# Patient Record
Sex: Male | Born: 1970 | Race: White | Hispanic: No | Marital: Married | Smoking: Never smoker
Health system: Southern US, Community
[De-identification: ages and names within clinical notes are randomized; demographics above are authoritative.]

## PROBLEM LIST (undated history)

## (undated) DIAGNOSIS — N486 Induration penis plastica: Secondary | ICD-10-CM

## (undated) DIAGNOSIS — K529 Noninfective gastroenteritis and colitis, unspecified: Secondary | ICD-10-CM

## (undated) DIAGNOSIS — D509 Iron deficiency anemia, unspecified: Secondary | ICD-10-CM

## (undated) DIAGNOSIS — G8929 Other chronic pain: Secondary | ICD-10-CM

## (undated) DIAGNOSIS — A630 Anogenital (venereal) warts: Secondary | ICD-10-CM

## (undated) DIAGNOSIS — R51 Headache: Secondary | ICD-10-CM

## (undated) DIAGNOSIS — K259 Gastric ulcer, unspecified as acute or chronic, without hemorrhage or perforation: Secondary | ICD-10-CM

## (undated) DIAGNOSIS — R519 Headache, unspecified: Secondary | ICD-10-CM

## (undated) DIAGNOSIS — N411 Chronic prostatitis: Secondary | ICD-10-CM

## (undated) DIAGNOSIS — N529 Male erectile dysfunction, unspecified: Secondary | ICD-10-CM

## (undated) DIAGNOSIS — F909 Attention-deficit hyperactivity disorder, unspecified type: Secondary | ICD-10-CM

## (undated) DIAGNOSIS — K5090 Crohn's disease, unspecified, without complications: Secondary | ICD-10-CM

## (undated) HISTORY — PX: WISDOM TOOTH EXTRACTION: SHX21

## (undated) HISTORY — DX: Headache: R51

## (undated) HISTORY — DX: Other chronic pain: G89.29

## (undated) HISTORY — DX: Induration penis plastica: N48.6

## (undated) HISTORY — DX: Noninfective gastroenteritis and colitis, unspecified: K52.9

## (undated) HISTORY — DX: Gastric ulcer, unspecified as acute or chronic, without hemorrhage or perforation: K25.9

## (undated) HISTORY — DX: Crohn's disease, unspecified, without complications: K50.90

## (undated) HISTORY — DX: Chronic prostatitis: N41.1

## (undated) HISTORY — DX: Headache, unspecified: R51.9

## (undated) HISTORY — DX: Iron deficiency anemia, unspecified: D50.9

## (undated) HISTORY — DX: Anogenital (venereal) warts: A63.0

## (undated) HISTORY — DX: Attention-deficit hyperactivity disorder, unspecified type: F90.9

## (undated) HISTORY — DX: Male erectile dysfunction, unspecified: N52.9

---

## 1998-10-03 HISTORY — PX: COLON SURGERY: SHX602

## 1999-03-08 HISTORY — PX: ESOPHAGOGASTRODUODENOSCOPY: SHX1529

## 2014-03-13 DIAGNOSIS — E162 Hypoglycemia, unspecified: Secondary | ICD-10-CM | POA: Insufficient documentation

## 2014-03-13 DIAGNOSIS — K5090 Crohn's disease, unspecified, without complications: Secondary | ICD-10-CM | POA: Insufficient documentation

## 2014-03-13 DIAGNOSIS — R519 Headache, unspecified: Secondary | ICD-10-CM | POA: Insufficient documentation

## 2014-03-13 DIAGNOSIS — R053 Chronic cough: Secondary | ICD-10-CM | POA: Insufficient documentation

## 2014-03-13 DIAGNOSIS — K259 Gastric ulcer, unspecified as acute or chronic, without hemorrhage or perforation: Secondary | ICD-10-CM | POA: Insufficient documentation

## 2014-03-13 DIAGNOSIS — M5137 Other intervertebral disc degeneration, lumbosacral region: Secondary | ICD-10-CM | POA: Insufficient documentation

## 2014-03-13 DIAGNOSIS — R05 Cough: Secondary | ICD-10-CM | POA: Insufficient documentation

## 2014-03-13 DIAGNOSIS — D509 Iron deficiency anemia, unspecified: Secondary | ICD-10-CM | POA: Insufficient documentation

## 2014-03-13 DIAGNOSIS — R51 Headache: Secondary | ICD-10-CM

## 2014-11-12 DIAGNOSIS — G894 Chronic pain syndrome: Secondary | ICD-10-CM | POA: Insufficient documentation

## 2014-11-12 DIAGNOSIS — F909 Attention-deficit hyperactivity disorder, unspecified type: Secondary | ICD-10-CM | POA: Insufficient documentation

## 2015-07-21 ENCOUNTER — Ambulatory Visit: Admit: 2015-07-21 | Payer: Self-pay | Admitting: Unknown Physician Specialty

## 2015-07-21 HISTORY — PX: COLONOSCOPY: SHX174

## 2015-07-21 SURGERY — COLONOSCOPY WITH PROPOFOL
Anesthesia: General

## 2015-11-26 DIAGNOSIS — N486 Induration penis plastica: Secondary | ICD-10-CM | POA: Insufficient documentation

## 2015-11-26 DIAGNOSIS — A630 Anogenital (venereal) warts: Secondary | ICD-10-CM | POA: Insufficient documentation

## 2015-11-26 DIAGNOSIS — N411 Chronic prostatitis: Secondary | ICD-10-CM | POA: Insufficient documentation

## 2017-03-08 ENCOUNTER — Ambulatory Visit: Payer: Managed Care, Other (non HMO) | Attending: Nurse Practitioner | Admitting: Nurse Practitioner

## 2017-03-08 ENCOUNTER — Encounter: Payer: Self-pay | Admitting: Nurse Practitioner

## 2017-03-08 VITALS — BP 114/70 | HR 78 | Temp 97.6°F | Resp 16 | Ht 71.0 in | Wt 192.0 lb

## 2017-03-08 DIAGNOSIS — G894 Chronic pain syndrome: Secondary | ICD-10-CM

## 2017-03-08 DIAGNOSIS — M5137 Other intervertebral disc degeneration, lumbosacral region: Secondary | ICD-10-CM

## 2017-03-08 DIAGNOSIS — M545 Low back pain: Secondary | ICD-10-CM | POA: Insufficient documentation

## 2017-03-08 DIAGNOSIS — M79642 Pain in left hand: Secondary | ICD-10-CM

## 2017-03-08 DIAGNOSIS — F909 Attention-deficit hyperactivity disorder, unspecified type: Secondary | ICD-10-CM | POA: Diagnosis not present

## 2017-03-08 DIAGNOSIS — M542 Cervicalgia: Secondary | ICD-10-CM | POA: Diagnosis present

## 2017-03-08 DIAGNOSIS — K589 Irritable bowel syndrome without diarrhea: Secondary | ICD-10-CM | POA: Diagnosis not present

## 2017-03-08 DIAGNOSIS — K219 Gastro-esophageal reflux disease without esophagitis: Secondary | ICD-10-CM | POA: Insufficient documentation

## 2017-03-08 DIAGNOSIS — M79643 Pain in unspecified hand: Secondary | ICD-10-CM | POA: Insufficient documentation

## 2017-03-08 DIAGNOSIS — M51379 Other intervertebral disc degeneration, lumbosacral region without mention of lumbar back pain or lower extremity pain: Secondary | ICD-10-CM

## 2017-03-08 DIAGNOSIS — R51 Headache: Secondary | ICD-10-CM | POA: Diagnosis not present

## 2017-03-08 DIAGNOSIS — F17210 Nicotine dependence, cigarettes, uncomplicated: Secondary | ICD-10-CM | POA: Insufficient documentation

## 2017-03-08 DIAGNOSIS — M79641 Pain in right hand: Secondary | ICD-10-CM | POA: Diagnosis not present

## 2017-03-08 DIAGNOSIS — Z79891 Long term (current) use of opiate analgesic: Secondary | ICD-10-CM

## 2017-03-08 DIAGNOSIS — F329 Major depressive disorder, single episode, unspecified: Secondary | ICD-10-CM | POA: Insufficient documentation

## 2017-03-08 DIAGNOSIS — Z7982 Long term (current) use of aspirin: Secondary | ICD-10-CM | POA: Insufficient documentation

## 2017-03-08 NOTE — Patient Instructions (Signed)
____________________________________________________________________________________________  Appointment Policy  It is our goal and responsibility to provide the medical community with assistance in the evaluation and management of patients with chronic pain. Unfortunately our resources are limited. Because we do not have an unlimited amount of time, or available appointments, we are required to closely monitor and manage their use. The following rules exist to maximize their use:  Patient's responsibilities: 1. Punctuality: You are required to be physically present and registered in our facility at least 30 minutes before your appointment. 2. Tardiness: The cutoff is your appointment time. If you have an appointment scheduled for 10:00 AM and you arrive at 10:01, you will be required to reschedule your appointment.  3. Plan ahead: Always assume that you will encounter traffic on your way in. Plan for it. If you are dependent on a driver, make sure they understand these rules and the need to arrive early. 4. Other appointments and responsibilities: Avoid scheduling any other appointments before or after your pain clinic appointments.  5. Be prepared: Write down everything that you need to discuss with your healthcare provider and give this information to the admitting nurse. Write down the medications that you will need refilled. Bring your pills and bottles (even the empty ones), to all of your appointments, except for those where a procedure is scheduled. 6. No children or pets: Find someone to take care of them. It is not appropriate to bring them in. 7. Scheduling changes: We request "advanced notification" of any changes or cancellations. 8. Advanced notification: Defined as a time period of more than 24 hours prior to the originally scheduled appointment. This allows for the appointment to be offered to other patients. 9. Rescheduling: When a visit is rescheduled, it will require the  cancellation of the original appointment. For this reason they both fall within the category of "Cancellations".  10. Cancellations: They require advanced notification. Any cancellation less than 24 hours before the  appointment will be recorded as a "No Show". 11. No Show: Defined as an unkept appointment where the patient failed to notify or declare to the practice their intention or inability to keep the appointment.  Corrective process for repeat offenders:  1. Tardiness: Three (3) episodes of rescheduling due to late arrivals will be recorded as one (1) "No Show". 2. Cancellation or reschedule: Three (3) cancellations or rescheduling will be recorded as one (1) "No Show". 3. "No Shows": Three (3) "No Shows" within a 12 month period will result in discharge from the practice.  ____________________________________________________________________________________________   ____________________________________________________________________________________________  Medication Rules  Applies to: All patients receiving prescriptions (written or electronic).  Pharmacy of record: Pharmacy where electronic prescriptions will be sent. If written prescriptions are taken to a different pharmacy, please inform the nursing staff. The pharmacy listed in the electronic medical record should be the one where you would like electronic prescriptions to be sent.  Prescription refills: Only during scheduled appointments. Applies to both, written and electronic prescriptions.  NOTE: The following applies primarily to controlled substances (Opioid Pain Medications)  Patient's responsibilities: 1. Pain Pills: Bring all pain pills to every appointment (except for procedure appointments). 2. Pill Bottles: Bring pills in original pharmacy bottle. Always bring newest bottle. Bring bottle, even if empty. 3. Medication refills: You are responsible for knowing and keeping track of what medications you need  refilled. The day before your appointment, write a list of all prescriptions that need to be refilled. Bring that list to your appointment and give it to the  admitting nurse. Prescriptions will be written only during appointments. If you forget a medication, it will not be "Called in", "Faxed", or "electronically sent". You will need to get another appointment to get these prescribed. 4. Prescription Accuracy: You are responsible for carefully inspecting your prescriptions before leaving our office. Have the discharge nurse carefully go over each prescription with you, before taking them home. Make sure that your name is accurately spelled, that your address is correct. Check the name and dose of your medication to make sure it is accurate. Check the number of pills, and the written instructions to make sure they are clear and accurate. Make sure that you are given enough medication to last until your next medication refill appointment. 5. Taking Medication: Take medication as prescribed. Never take more pills than instructed. Never take medication more frequently than prescribed. Taking less pills or less frequently is permitted and encouraged, when it comes to controlled substances (written prescriptions).  6. Inform other Doctors: Always inform, all of your healthcare providers, of all the medications you take. 7. Pain Medication from other Providers: You are not allowed to accept any additional pain medication from any other Doctor or Healthcare provider. There are two exceptions to this rule. (see below) In the event that you require additional pain medication, you are responsible for notifying us, as stated below. 8. Medication Agreement: You are responsible for carefully reading and following our Medication Agreement. This must be signed before receiving any prescriptions from our practice. Safely store a copy of your signed Agreement. Violations to the Agreement will result in no further prescriptions.  (Additional copies of our Medication Agreement are available upon request.) 9. Laws, Rules, & Regulations: All patients are expected to follow all Federal and [employer], Rules, [employer]. Ignorance of the Laws does not constitute a valid excuse.  Exceptions: There are only two exceptions to the rule of not receiving pain medications from other Healthcare Providers. 1. Exception #1 (Emergencies): In the event of an emergency (i.e.: accident requiring emergency care), you are allowed to receive additional pain medication. However, you are responsible for: As soon as you are able, call our office [phone], at any time of the day or night, and leave a message stating your name, the date and nature of the emergency, and the name and dose of the medication prescribed. In the event that your call is answered by a member of our staff, make sure to document and save the date, time, and the name of the person that took your information.  2. Exception #2 (Planned Surgery): In the event that you are scheduled by another doctor or dentist to have any type of surgery or procedure, you are allowed (for a period no longer than 30 days), to receive additional pain medication, for the acute post-op pain. However, in this case, you are responsible for picking up a copy of our "Post-op Pain Management for Surgeons" handout, and giving it to your surgeon or dentist. This document is available at our office, and does not require an appointment to obtain it. Simply go to our office during business hours (Monday-Thursday from 8:00 AM to 4:00 PM) (Friday 8:00 AM to 12:00 Noon) or if you have a scheduled appointment with [place], prior to your surgery, and ask for it by name. In addition, you will need to provide [place] with your name, name of your surgeon, type of surgery, and date of procedure or  surgery.  ____________________________________________________________________________________________ ____________________________________________________________________________________________  Pain Scale  Introduction: The pain score used by this practice is the Verbal Numerical Rating Scale (VNRS-11). This is an 11-point scale. It is for adults and children 10 years or older. There are significant differences in how the pain score is reported, used, and applied. Forget everything you learned in the past and learn this scoring system.  General Information: The scale should reflect your current level of pain. Unless you are specifically asked for the level of your worst pain, or your average pain. If you are asked for one of these two, then it should be understood that it is over the past 24 hours.  Basic Activities of Daily Living (ADL): Personal hygiene, dressing, eating, transferring, and using restroom.  Instructions: Most patients tend to report their level of pain as a combination of two factors, their physical pain and their psychosocial pain. This last one is also known as suffering and it is reflection of how physical pain affects you socially and psychologically. From now on, report them separately. From this point on, when asked to report your pain level, report only your physical pain. Use the following table for reference.  Pain Clinic Pain Levels (0-5/10)  Pain Level Score  Description  No Pain 0   Mild pain 1 Nagging, annoying, but does not interfere with basic activities of daily living (ADL). Patients are able to eat, bathe, get dressed, toileting (being able to get on and off the toilet and perform personal hygiene functions), transfer (move in and out of bed or a chair without assistance), and maintain continence (able to control bladder and bowel functions). Blood pressure and heart rate are unaffected. A normal heart rate for a healthy adult ranges from 60 to 100 bpm  (beats per minute).   Mild to moderate pain 2 Noticeable and distracting. Impossible to hide from other people. More frequent flare-ups. Still possible to adapt and function close to normal. It can be very annoying and may have occasional stronger flare-ups. With discipline, patients may get used to it and adapt.   Moderate pain 3 Interferes significantly with activities of daily living (ADL). It becomes difficult to feed, bathe, get dressed, get on and off the toilet or to perform personal hygiene functions. Difficult to get in and out of bed or a chair without assistance. Very distracting. With effort, it can be ignored when deeply involved in activities.   Moderately severe pain 4 Impossible to ignore for more than a few minutes. With effort, patients may still be able to manage work or participate in some social activities. Very difficult to concentrate. Signs of autonomic nervous system discharge are evident: dilated pupils (mydriasis); mild sweating (diaphoresis); sleep interference. Heart rate becomes elevated (>115 bpm). Diastolic blood pressure (lower number) rises above 100 mmHg. Patients find relief in laying down and not moving.   Severe pain 5 Intense and extremely unpleasant. Associated with frowning face and frequent crying. Pain overwhelms the senses.  Ability to do any activity or maintain social relationships becomes significantly limited. Conversation becomes difficult. Pacing back and forth is common, as getting into a comfortable position is nearly impossible. Pain wakes you up from deep sleep. Physical signs will be obvious: pupillary dilation; increased sweating; goosebumps; brisk reflexes; cold, clammy hands and feet; nausea, vomiting or dry heaves; loss of appetite; significant sleep disturbance with inability to fall asleep or to remain asleep. When persistent, significant weight loss is observed due to the complete loss of appetite and sleep deprivation.  Blood pressure and heart  rate  becomes significantly elevated. Caution: If elevated blood pressure triggers a pounding headache associated with blurred vision, then the patient should immediately seek attention at an urgent or emergency care unit, as these may be signs of an impending stroke.    Emergency Department Pain Levels (6-10/10)  Emergency Room Pain 6 Severely limiting. Requires emergency care and should not be seen or managed at an outpatient pain management facility. Communication becomes difficult and requires great effort. Assistance to reach the emergency department may be required. Facial flushing and profuse sweating along with potentially dangerous increases in heart rate and blood pressure will be evident.   Distressing pain 7 Self-care is very difficult. Assistance is required to transport, or use restroom. Assistance to reach the emergency department will be required. Tasks requiring coordination, such as bathing and getting dressed become very difficult.   Disabling pain 8 Self-care is no longer possible. At this level, pain is disabling. The individual is unable to do even the most basic activities such as walking, eating, bathing, dressing, transferring to a bed, or toileting. Fine motor skills are lost. It is difficult to think clearly.   Incapacitating pain 9 Pain becomes incapacitating. Thought processing is no longer possible. Difficult to remember your own name. Control of movement and coordination are lost.   The worst pain imaginable 10 At this level, most patients pass out from pain. When this level is reached, collapse of the autonomic nervous system occurs, leading to a sudden drop in blood pressure and heart rate. This in turn results in a temporary and dramatic drop in blood flow to the brain, leading to a loss of consciousness. Fainting is one of the bodys self defense mechanisms. Passing out puts the brain in a calmed state and causes it to shut down for a while, in order to begin the  healing process.    Summary: 1. Refer to this scale when providing [place] with your pain level. 2. Be accurate and careful when reporting your pain level. This will help with your care. 3. Over-reporting your pain level will lead to loss of credibility. 4. Even a level of 1/10 means that there is pain and will be treated at our facility. 5. High, inaccurate reporting will be documented as Symptom Exaggeration, leading to loss of credibility and suspicions of possible secondary gains such as obtaining more narcotics, or wanting to appear disabled, for fraudulent reasons. 6. Only pain levels of 5 or below will be seen at our facility. 7. Pain levels of 6 and above will be sent to the Emergency Department and the appointment cancelled. ____________________________________________________________________________________________  ____________________________________________________________________________________________  DRUG HOLIDAYS  Definitions Tolerance: defined as the progressively decreased responsiveness to a drug. Occurs when the drug is used repeatedly and the body adapts to the continued presence of the drug. As a result, a larger dose of the drug is needed to achieve the effect originally obtained by a smaller dose. It is thought to be due to the formation of excess opioid receptors.  Drug Holiday: is when a patient stops taking a medication(s) for a period of time; anywhere from a few days to several weeks.  Withdrawals: refers to the wide range of symptoms that occur after stopping or dramatically reducing opiate drugs after heavy and prolonged use. Withdrawal symptoms do not occur to patients that use low dose opioids, or those who take the medication sporadically. Contrary to benzodiazepine (example: Valium, Xanax, etc.) or alcohol withdrawals (Delirium Tremens), opioid withdrawals are not lethal. Withdrawals are the physical  manifestation of the body getting rid of the excess  receptors.  Purpose To eliminate tolerance.  Duration of Holiday 14 consecutive days. (2 weeks)  Expected Symptoms Early symptoms of withdrawal include:  Agitation  Anxiety  Muscle aches  Increased tearing  Insomnia  Runny nose  Sweating  Yawning  Late symptoms of withdrawal include:  Abdominal cramping  Diarrhea  Dilated pupils  Goose bumps  Nausea  Vomiting  Opioid withdrawal reactions are very uncomfortable but are not life-threatening. Symptoms usually start within 12 hours of last opioid dose and within 30 hours of last methadone exposure.  Duration of Symptoms 48 to 72 hours for short acting medications and 2 to 14 days for methadone.  Treatment  Clonidine (Catapres) or tizanidine (Zanaflex) for agitation, sweating, tearing, runny nose.  Promethazine (Phenergan) for nausea, vomiting.  NSAIDs for pain.  Benefits  Improved effectiveness of opioids.  Decreased opioid dose needed to achieve benefits.  Improved pain with lesser dose. ____________________________________________________________________________________________

## 2017-03-08 NOTE — Progress Notes (Signed)
Patient's Name: Stephen Wise  MRN: [id number]  Referring Provider: Idelle Crouch, MD  DOB: [birth year]  PCP: Idelle Crouch, MD  DOS: 03/08/2017  Note by: Dionisio David NP  Service setting: Ambulatory outpatient  Specialty: Interventional Pain Management  Location: [facility] (AMB) Pain Management Facility    Patient type: New Patient    Primary Reason(s) for Visit: Initial Patient Evaluation CC: Back Pain (lower) and Headache (entire)  HPI  Stephen Wise is a 46 y.o. year old, male patient, who comes today for an initial evaluation. He has Chronic pain syndrome; Long term current use of opiate analgesic; Adult ADHD; Chronic cough; Chronic headaches; Chronic prostatitis; Condyloma acuminatum of penis; Crohn's disease ([place]); Disc disease, degenerative, lumbar or lumbosacral; Gastric ulcer; Hypoglycemia; Microcytic anemia; Pain syndrome, chronic; Peyronie's disease; Neck pain; and Hand pain on his problem list.. His primarily concern today is the Back Pain (lower) and Headache (entire)  Pain Assessment: Self-Reported Pain Score: 7 /10 Clinically the patient looks like a 2/10 Reported level is inconsistent with clinical observations. Information on the proper use of the pain scale provided to the patient today Pain Type: Chronic pain Pain Location: Back Pain Orientation: Right, Left, Lower Pain Descriptors / Indicators: Constant, Aching, Discomfort (irriatied) Pain Frequency: Constant  Onset and Duration: Gradual and Present longer than 3 months Cause of pain: Unknown Severity: No change since onset, NAS-11 at its worse: 10/10, NAS-11 at its best: 4/10, NAS-11 now: 7/10 and NAS-11 on the average: 8/10 Timing: Not influenced by the time of the day Aggravating Factors: Bending, Intercourse (sex), Kneeling, Lifiting, Prolonged sitting, Prolonged standing, Squatting, Stooping , Walking and Working Alleviating Factors: Stretching, Cold packs, Hot packs, Lying down, Medications, Warm showers or baths and  Chiropractic manipulations Associated Problems: Depression, Dizziness, Erectile dysfunction, Fatigue, Inability to concentrate, Nausea, Numbness, Personality changes, Spasms, Swelling, Tingling, Weakness and Pain that wakes patient up Quality of Pain: Cruel, Deep, Dreadful, Pressure-like, Pulsating, Sharp, Shooting, Splitting, Throbbing, Tingling and Uncomfortable Previous Examinations or Tests: CT scan, Ct-Myelogram, Endoscopy, MRI scan, X-rays, Chiropractic evaluation and Psychiatric evaluation Previous Treatments: Narcotic medications, Steroid treatments by mouth and Stretching exercises  The patient comes into the clinics today for the first time for a chronic pain management evaluation. According to patient primary area of pain is his lower back. He admits that the left is greater than the right. He describes as sudden sharp pain that radiates down his left leg into his posterior thigh. He admitted he has been having back pain since high school. He denies any previous surgeries or interventional procedures. He admits that he had physical therapy approximately 10 years ago it was somewhat effective he only went a few times. He denies any recent images. His next area of pain is in his hands. He admits the right is greater than the left. He wakes up in the morning and has swelling and feels like his hands are contracted. He does have some numbness. He denies any tingling. He does have a little weakness when it comes to grasping. He denies any previous injuries, surgeries, interventional therapies, physical therapy or images. He feels this is related to his current job which is "very physical". His third area of pain is his head. He admits that he has headaches. He feels like this is being going on for approximately 5 years. He states that he had a workup with Dr. Doy Wise however everything was negative. He describes his headaches as been on the left temporal lobe that moved back slightly. He  also has pain  that comes up from his neck and goes into his head. He describes the headache as being migraines. He denies any real precipitating factors. He states that the sun light does cause more problems and stress seems to be a trigger. He has had past images none were seen in medical record. He does use Fioricet, ice packs and rest for his migraines.  Today I took the time to provide the patient with information regarding this pain practice. The patient was informed that the practice is divided into two sections: an interventional pain management section, as well as a completely separate and distinct medication management section. I explained that there are procedure days for interventional therapies, and evaluation days for follow-ups and medication management. Because of the amount of documentation required during both, they are kept separated. This means that there is the possibility that he may be scheduled for a procedure on one day, and medication management the next. I have also informed him that because of staffing and facility limitations, this practice will no longer take patients for medication management only. To illustrate the reasons for this, I gave the patient the example of surgeons, and how inappropriate it would be to refer a patient to his/her care, just to write for the post-surgical antibiotics on a surgery done by a different surgeon.   Because interventional pain management is part of the board-certified specialty for the doctors, the patient was informed that joining this practice means that they are open to any and all interventional therapies. I made it clear that this does not mean that they will be forced to have any procedures done. What this means is that I believe interventional therapies to be essential part of the diagnosis and proper management of chronic pain conditions. Therefore, patients not interested in these interventional alternatives will be better served under the care of a  different practitioner.  The patient was also made aware of my Comprehensive Pain Management Safety Guidelines where by joining this practice, they limit all of their nerve blocks and joint injections to those done by our practice, for as long as we are retained to manage their care. Historic Controlled Substance Pharmacotherapy Review  PMP and historical list of controlled substances:Adderall 10 mg twice daily, Fioricet with codeine 3 times daily, hydrocodone/acetaminophen 10/325 4 times daily, hydrocodone/Chlorphen suspension 240 mL, alprazolam 0.5 mg tablets twice daily, Cheratussin before meals syrup 240 mL Highest opioid analgesic regimen found: Hydrocodone/acetaminophen 10/325 mg per day 6 tablets daily Most recent opioid analgesic: Fioricet with codeine, hydrocodone/acetaminophen 10/325 mg Current opioid analgesics: Fioricet with codeine, hydrocodone/acetaminophen 10/325 mg Highest recorded MME/day: 67 mg/day MME/day: 39m/day Medications: The patient did not bring the medication(s) to the appointment, as requested in our "New Patient Package" Pharmacodynamics: Desired effects: Analgesia: The patient reports >50% benefit. Reported improvement in function: The patient reports medication allows him to accomplish basic ADLs. Clinically meaningful improvement in function (CMIF): Sustained CMIF goals met Perceived effectiveness: Described as relatively effective, allowing for increase in activities of daily living (ADL) Undesirable effects: Side-effects or Adverse reactions: None reported Historical Monitoring: The patient  reports that he does not use drugs. List of all UDS Test(s): No results found for: MDMA, COCAINSCRNUR, PCPSCRNUR, PCPQUANT, CANNABQUANT, THCU, ERiverviewList of all Serum Drug Screening Test(s):  No results found for: AMPHSCRSER, BARBSCRSER, BENZOSCRSER, COCAINSCRSER, PCPSCRSER, PCPQUANT, THCSCRSER, CANNABQUANT, OPIATESCRSER, OXYSCRSER, PROPOXSCRSER Historical Background  Evaluation: [place] PDMP: Six (6) year initial data search conducted. Regular, uninterrupted pattern of monthly opioid refills  detected.       [place] Department of public safety, offender search: Editor, commissioning Information) Non-contributory Risk Assessment Profile: Aberrant behavior: None observed or detected today Risk factors for fatal opioid overdose: Male gender, Age 80-4 years old and Caucasian Fatal overdose hazard ratio (HR): 1.92 for 50-99 MME/day Non-fatal overdose hazard ratio (HR): 3.73 for 50-99 MME/day Risk of opioid abuse or dependence: 0.7-3.0% with doses ? 36 MME/day and 6.1-26% with doses ? 120 MME/day. Substance use disorder (SUD) risk level: Pending results of Medical Psychology Evaluation for SUD Opioid risk tool (ORT) (Total Score): 2  ORT Scoring interpretation table:  Score <3 = Low Risk for SUD  Score between 4-7 = Moderate Risk for SUD  Score >8 = High Risk for Opioid Abuse   PHQ-2 Depression Scale:  Total score: 0  PHQ-2 Scoring interpretation table: (Score and probability of major depressive disorder)  Score 0 = No depression  Score 1 = 15.4% Probability  Score 2 = 21.1% Probability  Score 3 = 38.4% Probability  Score 4 = 45.5% Probability  Score 5 = 56.4% Probability  Score 6 = 78.6% Probability   PHQ-9 Depression Scale:  Total score: 0  PHQ-9 Scoring interpretation table:  Score 0-4 = No depression  Score 5-9 = Mild depression  Score 10-14 = Moderate depression  Score 15-19 = Moderately severe depression  Score 20-27 = Severe depression (2.4 times higher risk of SUD and 2.89 times higher risk of overuse)   Pharmacologic Plan: Pending ordered tests and/or consults  Meds  The patient has a current medication list which includes the following prescription(s): amphetamine-dextroamphetamine, aspirin-acetaminophen-caffeine, butalbital-acetaminophen-caffeine, esomeprazole, hydrocodone-acetaminophen, and mesalamine.  No current outpatient prescriptions on file prior to  visit.   No current facility-administered medications on file prior to visit.    Imaging Review  Note: Available results from prior imaging studies were reviewed.        ROS  Cardiovascular History: Daily Aspirin intake Pulmonary or Respiratory History: Negative for bronchial asthma, emphysema, chronic smoking, chronic bronchitis, sarcoidosis, tuberculosis or sleep apena Neurological History: Negative for epilepsy, stroke, urinary or fecal inontinence, spina bifida or tethered cord syndrome Review of Past Neurological Studies: No results found for this or any previous visit. Psychological-Psychiatric History: Depression Gastrointestinal History: Ulcers, Reflux or heatburn and Irritable Bowel Syndrome (IBS) Genitourinary History: Negative for nephrolithiasis, hematuria, renal failure or chronic kidney disease Hematological History: Anemia Endocrine History: Negative for diabetes or thyroid disease Rheumatologic History: Negative for lupus, osteoarthritis, rheumatoid arthritis, myositis, polymyositis or fibromyagia Musculoskeletal History: Negative for myasthenia gravis, muscular dystrophy, multiple sclerosis or malignant hyperthermia Work History: Working full time  Allergies  Stephen Wise has no allergies on file.  Laboratory Chemistry  Inflammation Markers No results found for: CRP, ESRSEDRATE (CRP: Acute Phase) (ESR: Chronic Phase) Renal Function Markers No results found for: BUN, CREATININE, GFRAA, GFRNONAA Hepatic Function Markers No results found for: AST, ALT, ALBUMIN, ALKPHOS, HCVAB Electrolytes No results found for: NA, K, CL, CALCIUM, MG Neuropathy Markers No results found for: [id number] Bone Pathology Markers No results found for: Hendricks Milo, VD125OH2TOT, [id number], [id number], 25OHVITD1, 25OHVITD2, 25OHVITD3, CALCIUM, TESTOFREE, TESTOSTERONE Coagulation Parameters No results found for: INR, LABPROT, APTT, PLT Cardiovascular Markers No results found for: BNP, HGB,  HCT Note: Lab results reviewed.  [place]  Drug: Stephen Wise  reports that he does not use drugs. Alcohol:  reports that he does not drink alcohol. Tobacco:  reports that he has never smoked. He has never used smokeless tobacco. Medical:  has a past  medical history of Chronic pain and Crohn disease ([place]). Family: family history includes Cirrhosis in his father.  Past Surgical History:  Procedure Laterality Date  . COLON SURGERY     2000   Active Ambulatory Problems    Diagnosis Date Noted  . Chronic pain syndrome 03/08/2017  . Long term current use of opiate analgesic 03/08/2017  . Adult ADHD 11/12/2014  . Chronic cough 03/13/2014  . Chronic headaches 03/13/2014  . Chronic prostatitis 11/26/2015  . Condyloma acuminatum of penis 11/26/2015  . Crohn's disease ([place]) 03/13/2014  . Disc disease, degenerative, lumbar or lumbosacral 03/13/2014  . Gastric ulcer 03/13/2014  . Hypoglycemia 03/13/2014  . Microcytic anemia 03/13/2014  . Pain syndrome, chronic 11/12/2014  . Peyronie's disease 11/26/2015  . Neck pain 03/08/2017  . Hand pain 03/08/2017   Resolved Ambulatory Problems    Diagnosis Date Noted  . No Resolved Ambulatory Problems   Past Medical History:  Diagnosis Date  . Chronic pain   . Crohn disease ([place])    Constitutional Exam  General appearance: Well nourished, well developed, and well hydrated. In no apparent acute distress Vitals:   03/08/17 0832  BP: 114/70  Pulse: 78  Resp: 16  Temp: 97.6 F (36.4 C)  SpO2: 100%  Weight: 192 lb (87.1 kg)  Height: 5' 11"  (1.803 m)   BMI Assessment: Estimated body mass index is 26.78 kg/m as calculated from the following:   Height as of this encounter: 5' 11"  (1.803 m).   Weight as of this encounter: 192 lb (87.1 kg).  BMI interpretation table: BMI level Category Range association with higher incidence of chronic pain  <18 kg/m2 Underweight   18.5-24.9 kg/m2 Ideal body weight   25-29.9 kg/m2 Overweight Increased incidence  by 20%  30-34.9 kg/m2 Obese (Class I) Increased incidence by 68%  35-39.9 kg/m2 Severe obesity (Class II) Increased incidence by 136%  >40 kg/m2 Extreme obesity (Class III) Increased incidence by 254%   BMI Readings from Last 4 Encounters:  03/08/17 26.78 kg/m   Wt Readings from Last 4 Encounters:  03/08/17 192 lb (87.1 kg)  Psych/Mental status: Alert, oriented x 3 (person, place, & time)       Eyes: PERLA Respiratory: No evidence of acute respiratory distress  Cervical Spine Exam  Inspection: No masses, redness, or swelling Alignment: Symmetrical Functional ROM: Unrestricted ROM      Stability: No instability detected Muscle strength & Tone: Functionally intact Sensory: Unimpaired Palpation: No palpable anomalies              Upper Extremity (UE) Exam    Side: Right upper extremity  Side: Left upper extremity  Inspection: No masses, redness, swelling, or asymmetry. No contractures  Inspection: No masses, redness, swelling, or asymmetry. No contractures  Functional ROM: Unrestricted ROM          Functional ROM: Unrestricted ROM          Muscle strength & Tone: Functionally intact  Muscle strength & Tone: Functionally intact  Sensory: Unimpaired  Sensory: Unimpaired  Palpation: No palpable anomalies              Palpation: No palpable anomalies              Specialized Test(s): Deferred         Specialized Test(s): Deferred          Thoracic Spine Exam  Inspection: No masses, redness, or swelling Alignment: Symmetrical Functional ROM: Unrestricted ROM Stability: No instability detected Sensory: Unimpaired Muscle  strength & Tone: No palpable anomalies  Lumbar Spine Exam  Inspection: No masses, redness, or swelling Alignment: Symmetrical Functional ROM: Unrestricted ROM      Stability: No instability detected Muscle strength & Tone: Functionally intact Sensory: Unimpaired Palpation: No palpable anomalies       Provocative Tests: Lumbar Hyperextension and rotation test:  Positive on the left for facet joint pain. Patrick's Maneuver: Positive for left-sided S-I arthralgia              Gait & Posture Assessment  Ambulation: Unassisted Gait: Relatively normal for age and body habitus Posture: WNL   Lower Extremity Exam    Side: Right lower extremity  Side: Left lower extremity  Inspection: No masses, redness, swelling, or asymmetry. No contractures  Inspection: No masses, redness, swelling, or asymmetry. No contractures  Functional ROM: Unrestricted ROM          Functional ROM: Unrestricted ROM          Muscle strength & Tone: Functionally intact  Muscle strength & Tone: Functionally intact  Sensory: Unimpaired  Sensory: Unimpaired  Palpation: No palpable anomalies  Palpation: No palpable anomalies   Assessment  Primary Diagnosis & Pertinent Problem List: The primary encounter diagnosis was Disc disease, degenerative, lumbar or lumbosacral. Diagnoses of Chronic pain syndrome, Long term current use of opiate analgesic, Neck pain, and Pain in both hands were also pertinent to this visit.  Visit Diagnosis: 1. Disc disease, degenerative, lumbar or lumbosacral   2. Chronic pain syndrome   3. Long term current use of opiate analgesic   4. Neck pain   5. Pain in both hands    Plan of Care  Initial treatment plan:  Please be advised that as per protocol, today's visit has been an evaluation only. We have not taken over the patient's controlled substance management.  Problem-specific plan: No problem-specific Assessment & Plan notes found for this encounter.  Ordered Lab-work, Procedure(s), Referral(s), & Consult(s): Orders Placed This Encounter  Procedures  . DG Lumbar Spine Complete W/Bend  . DG Cervical Spine Complete  . DG Hand 2 View Left  . DG Hand 2 View Right  . Comprehensive metabolic panel  . C-reactive protein  . Magnesium  . Sedimentation rate  . Vitamin B12  . 25-Hydroxyvitamin D Lcms D2+D3  . Compliance Drug Analysis, Ur  .  Ambulatory referral to Psychology   Pharmacotherapy: Medications ordered:  No orders of the defined types were placed in this encounter.  Medications administered during this visit: Stephen Wise had no medications administered during this visit.   Pharmacotherapy under consideration:  Opioid Analgesics: The patient was informed that there is no guarantee that he would be a candidate for opioid analgesics. The decision will be made following CDC guidelines. This decision will be based on the results of diagnostic studies, as well as Stephen Wise risk profile.  Membrane stabilizer: To be determined at a later time Muscle relaxant: To be determined at a later time NSAID: To be determined at a later time Other analgesic(s): To be determined at a later time   Interventional therapies under consideration: Stephen Wise was informed that there is no guarantee that he would be a candidate for interventional therapies. The decision will be based on the results of diagnostic studies, as well as Stephen Wise risk profile.  Possible procedure(s): Left side lumbar facet block Left-sided radiofrequency ablation Possible occipital nerve block Possible occipital radiofrequency ablation    Provider-requested follow-up: No Follow-up on file.  No future  appointments.  Primary Care Physician: Idelle Crouch, MD Location: [facility] Note by:  Date: 03/08/2017; Time: 2:46 PM  Pain Score Disclaimer: We use the NRS-11 scale. This is a self-reported, subjective measurement of pain severity with only modest accuracy. It is used primarily to identify changes within a particular patient. It must be understood that outpatient pain scales are significantly less accurate that those used for research, where they can be applied under ideal controlled circumstances with minimal exposure to variables. In reality, the score is likely to be a combination of pain intensity and pain affect, where pain  affect describes the degree of emotional arousal or changes in action readiness caused by the sensory experience of pain. Factors such as social and work situation, setting, emotional state, anxiety levels, expectation, and prior pain experience may influence pain perception and show large inter-individual differences that may also be affected by time variables.  Patient instructions provided during this appointment: There are no Patient Instructions on file for this visit.

## 2017-03-08 NOTE — Progress Notes (Signed)
Safety precautions to be maintained throughout the outpatient stay will include: orient to surroundings, keep bed in low position, maintain call bell within reach at all times, provide assistance with transfer out of bed and ambulation.  

## 2017-03-11 ENCOUNTER — Ambulatory Visit
Admission: RE | Admit: 2017-03-11 | Discharge: 2017-03-11 | Disposition: A | Discharge status: Home / Self Care | Payer: Managed Care, Other (non HMO) | Source: Ambulatory Visit | Attending: Nurse Practitioner | Admitting: Nurse Practitioner

## 2017-03-11 DIAGNOSIS — M79641 Pain in right hand: Secondary | ICD-10-CM | POA: Insufficient documentation

## 2017-03-11 DIAGNOSIS — M5137 Other intervertebral disc degeneration, lumbosacral region: Secondary | ICD-10-CM | POA: Diagnosis present

## 2017-03-11 DIAGNOSIS — G894 Chronic pain syndrome: Secondary | ICD-10-CM

## 2017-03-11 DIAGNOSIS — M899 Disorder of bone, unspecified: Secondary | ICD-10-CM | POA: Insufficient documentation

## 2017-03-11 DIAGNOSIS — M79642 Pain in left hand: Secondary | ICD-10-CM | POA: Insufficient documentation

## 2017-03-11 DIAGNOSIS — M542 Cervicalgia: Secondary | ICD-10-CM | POA: Insufficient documentation

## 2017-03-13 NOTE — Progress Notes (Signed)
Results were reviewed and found to be: mildly abnormal  Initial impression would suggest no surgically correctable pathology  Review would suggest no procedures needed at this time

## 2017-03-14 LAB — COMPLIANCE DRUG ANALYSIS, UR

## 2017-03-14 NOTE — Progress Notes (Signed)
UNREPORTED SUBSTANCE NOTE: This forensic urine drug screen (UDS) test was conducted using a state-of-the-art ultra high performance liquid chromatography and mass spectrometry system (UPLC/MS-MS), the most sophisticated and accurate method available. UPLC/MS-MS is 1,000 times more precise and accurate than standard gas chromatography and mass spectrometry (GC/MS). This system can analyze 26 drug categories and 180 drug compounds.  Unreported substance:Oxycodone  The findings of this UDT were reported as abnormal due to inconsistencies with expected results. An unreported substance was identified in the sample. Expectations were based on the medication history provided by the patient at the time of sample collection.  These results may suggest one of the following possibilities:  1). The use of multiple providers, suggesting the illegal practice of "Doctor Shopping", in violation of [place] Statutes, as well as our medication agreement.  2). The use of unsanctioned and possibly illegal substances, in violation of [place] Statutes, as well as our medication agreement. 3). Inaccurate list of reported substances.  ADDITIONAL UNLISTED OPIOIDS NOTE: This forensic urine drug screen (UDS) test was conducted using a state-of-the-art ultra high performance liquid chromatography and mass spectrometry system (UPLC/MS-MS), the most sophisticated and accurate method available. UPLC/MS-MS is 1,000 times more precise and accurate than standard gas chromatography and mass spectrometry (GC/MS). This system can analyze 26 drug categories and 180 drug compounds.  The findings of this UDT were reported as abnormal due to inconsistencies with expected results. An additional unreported opioid was identified in the sample. Expectations were based on the prescribed medication(s). Results are of concern due to the following possibilities:   1. The use of multiple providers, suggesting the illegal practice of "Doctor Shopping", in  violation of [place] Statutes, as well as our medication agreement.   2. The use of unreported sources of medication, possibly illegal, in violation of State and [employer], in addition to non-compliance with our medication agreement.   _

## 2017-03-28 ENCOUNTER — Ambulatory Visit: Payer: Managed Care, Other (non HMO) | Attending: Anesthesiology | Admitting: Anesthesiology

## 2017-03-28 ENCOUNTER — Encounter: Payer: Self-pay | Admitting: Anesthesiology

## 2017-03-28 VITALS — BP 127/84 | HR 69 | Temp 98.2°F | Resp 16 | Ht 70.0 in | Wt 195.0 lb

## 2017-03-28 DIAGNOSIS — M542 Cervicalgia: Secondary | ICD-10-CM | POA: Insufficient documentation

## 2017-03-28 DIAGNOSIS — Z79899 Other long term (current) drug therapy: Secondary | ICD-10-CM | POA: Insufficient documentation

## 2017-03-28 DIAGNOSIS — K5090 Crohn's disease, unspecified, without complications: Secondary | ICD-10-CM | POA: Insufficient documentation

## 2017-03-28 DIAGNOSIS — M79641 Pain in right hand: Secondary | ICD-10-CM | POA: Insufficient documentation

## 2017-03-28 DIAGNOSIS — G894 Chronic pain syndrome: Secondary | ICD-10-CM | POA: Diagnosis not present

## 2017-03-28 DIAGNOSIS — M5137 Other intervertebral disc degeneration, lumbosacral region: Secondary | ICD-10-CM

## 2017-03-28 DIAGNOSIS — M47816 Spondylosis without myelopathy or radiculopathy, lumbar region: Secondary | ICD-10-CM

## 2017-03-28 DIAGNOSIS — M79642 Pain in left hand: Secondary | ICD-10-CM

## 2017-03-28 DIAGNOSIS — M4686 Other specified inflammatory spondylopathies, lumbar region: Secondary | ICD-10-CM | POA: Insufficient documentation

## 2017-03-28 DIAGNOSIS — M5136 Other intervertebral disc degeneration, lumbar region: Secondary | ICD-10-CM | POA: Insufficient documentation

## 2017-03-28 DIAGNOSIS — Z7982 Long term (current) use of aspirin: Secondary | ICD-10-CM | POA: Insufficient documentation

## 2017-03-28 DIAGNOSIS — Z79891 Long term (current) use of opiate analgesic: Secondary | ICD-10-CM | POA: Insufficient documentation

## 2017-03-28 DIAGNOSIS — M4696 Unspecified inflammatory spondylopathy, lumbar region: Secondary | ICD-10-CM

## 2017-03-28 MED ORDER — HYDROCODONE-ACETAMINOPHEN 7.5-325 MG PO TABS: 1.0000 | ORAL_TABLET | Freq: Four times a day (QID) | ORAL | 0 refills | Status: DC | PRN

## 2017-03-28 NOTE — Progress Notes (Signed)
Subjective:  Patient ID: Stephen Wise, male    DOB: 06/11/1971  Age: 46 y.o. MRN: [id number]  CC: No chief complaint on file.   Service Provided on Last Visit: Evaluation (new patient visit with Crystal) PROCEDURE:None  HPI [name] presents for a reevaluation following his recent evaluation with [employer]. He continues to have frequent cervical pain with associated headaches primarily tension like with occasional migration into migrainous type headaches. He's been followed by Dr. Doy Hutching and takes intermittent butalbital for these and this generally helps with his migrainous headaches. His use of the butalbital is approximately 6-7 tablets per week and he has been on hydrocodone for control of his neck pain and low back pain. 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 He generally gets very good relief from the hydrocodone 10 mg tablets without significant side effects. We have reviewed his narcotic assessment sheet and he's had minimal side effects with these medications. Furthermore we've reviewed the [facility] practitioner database and it is appropriate. He was seen by our pain psychologist and rated as a low risk for opioid troubles.  History [name] has a past medical history of Chronic pain and Crohn disease (Stephen Wise).   He has a past surgical history that includes Colon surgery.   His family history includes Cirrhosis in his father.He reports that he has never smoked. He has never used smokeless tobacco. He reports that he does not drink alcohol or use drugs.    No results found for: TOXASSSELUR  Outpatient Medications Prior to Visit  Medication Sig Dispense Refill  . Aspirin-Acetaminophen-Caffeine (EXCEDRIN MIGRAINE PO) Take by mouth.    .  esomeprazole (NEXIUM) 40 MG capsule Take 40 mg by mouth every morning.    . mesalamine (PENTASA) 500 MG CR capsule Take 1,000 mg by mouth 3 (three) times daily as needed.     Marland Kitchen amphetamine-dextroamphetamine (ADDERALL) 10 MG tablet Take 1 tablet by mouth 2 (two) times daily.    . butalbital-acetaminophen-caffeine (FIORICET WITH CODEINE) 50-325-40-30 MG capsule Take 1 capsule by mouth every 4 (four) hours.    Marland Kitchen HYDROcodone-acetaminophen (NORCO) 10-325 MG tablet Take 1 tablet by mouth every 6 (six) hours as needed.     No facility-administered medications prior to visit.    No results found for: WBC, HGB, HCT, PLT, GLUCOSE, CHOL, TRIG, HDL, LDLDIRECT, LDLCALC, ALT, AST, NA, K, CL, CREATININE, BUN, CO2, TSH, PSA, INR, GLUF, HGBA1C, MICROALBUR  --------------------------------------------------------------------------------------------------------------------- Dg Cervical Spine Complete  Result Date: 03/11/2017 CLINICAL DATA:  Chronic cervicalgia with upper extremity tingling EXAM: CERVICAL SPINE - COMPLETE 4+ VIEW COMPARISON:  None. FINDINGS: Frontal, lateral, open-mouth odontoid, and bilateral oblique views were obtained. There is no fracture or spondylolisthesis. Prevertebral soft tissues and predental space regions are normal. The disc spaces appear unremarkable. There is no appreciable exit foraminal narrowing on the oblique views. Lung apices are clear. IMPRESSION: No fracture or spondylolisthesis.  No appreciable arthropathy. Electronically Signed   By: Lowella Grip III M.D.   On: 03/11/2017 10:56   Dg Lumbar Spine Complete W/bend  Result Date: 03/11/2017 CLINICAL DATA:  Chronic pain EXAM: LUMBAR SPINE - COMPLETE WITH BENDING VIEWS COMPARISON:  None. FINDINGS: Standing frontal, standing neutral lateral, standing flexion lateral, standing extension lateral, spot lumbosacral lateral, and bilateral oblique views were obtained.  There are 5 non-rib-bearing lumbar type vertebral bodies. There is mild  lower lumbar dextroscoliosis. There is no fracture. There is no spondylolisthesis on neutral lateral imaging. There is no appreciable change in lateral alignment with flexion and extension. The disc spaces appear unremarkable. Facet regions appear unremarkable. IMPRESSION: No fracture or spondylolisthesis. No appreciable change in lateral alignment with flexion-extension. No appreciable arthropathy. Electronically Signed   By: Lowella Grip III M.D.   On: 03/11/2017 10:54   Dg Hand 2 View Right  Result Date: 03/11/2017 CLINICAL DATA:  Chronic pain and tingling EXAM: RIGHT HAND - 2 VIEW COMPARISON:  None. FINDINGS: Frontal and lateral views were obtained. Bony mineralization is normal. No fracture or dislocation. The joint spaces appear normal. No erosive change or periostitis. IMPRESSION: No appreciable arthropathy.  No fracture or dislocation. Electronically Signed   By: Lowella Grip III M.D.   On: 03/11/2017 10:56   Dg Hand 2 View Left  Result Date: 03/11/2017 CLINICAL DATA:  Chronic bilateral hand pain.  No reported injury. EXAM: LEFT HAND - 2 VIEW COMPARISON:  None. FINDINGS: No acute fracture or dislocation. Tiny well corticated 1 mm density adjacent to the volar plate in the middle phalanx of the left third finger. No suspicious focal osseous lesion. No appreciable arthropathy. No radiopaque foreign body. IMPRESSION: Tiny well corticated 1 mm density adjacent to the volar plate in the middle phalanx of the left third finger, which may represent the sequela of a remote avulsion injury. No acute osseous injury or appreciable arthropathy in the left hand. Electronically Signed   By: Ilona Sorrel M.D.   On: 03/11/2017 10:56       ---------------------------------------------------------------------------------------------------------------------- Past Medical History:  Diagnosis Date  . Chronic pain   . Crohn disease [place])     Past Surgical History:  Procedure Laterality Date  . COLON  SURGERY     2000    Family History  Problem Relation Age of Onset  . Cirrhosis Father     Social History  Substance Use Topics  . Smoking status: Never Smoker  . Smokeless tobacco: Never Used  . Alcohol use No    ---------------------------------------------------------------------------------------------------------------------   BP 127/84 (BP Location: Right Arm, Patient Position: Sitting, Cuff Size: Normal)   Pulse 69   Temp 98.2 F (36.8 C) (Oral)   Resp 16   Ht 5' 10"  (1.778 m)   Wt 195 lb (88.5 kg)   SpO2 100%   BMI 27.98 kg/m    BP Readings from Last 3 Encounters:  03/28/17 127/84  03/08/17 114/70     Wt Readings from Last 3 Encounters:  03/28/17 195 lb (88.5 kg)  03/08/17 192 lb (87.1 kg)     ----------------------------------------------------------------------------------------------------------------------  ROS Review of Systems  No complaints of constipation No shortness of breath  Objective:  BP 127/84 (BP Location: Right Arm, Patient Position: Sitting, Cuff Size: Normal)   Pulse 69   Temp 98.2 F (36.8 C) (Oral)   Resp 16   Ht 5' 10"  (1.778 m)   Wt 195 lb (88.5 kg)   SpO2 100%   BMI 27.98 kg/m   Physical Exam patient is alert oriented cooperative compliant Heart is regular rate and rhythm Inspection of the cervical musculature reveals some generalized tenderness but no overt trigger points. He has good range of motion at the atlantooccipital joint. He has some tenderness in the splenius capitis muscles bilaterally. His strength in the upper extremities appears to be well-preserved with good muscle tone and bulk. Inspection  low back reveals bilateral paraspinous muscle tenderness in the lumbar region with the patient standing he does have pain with extension with left and right lateral rotation does precipitate pain radiating into the buttocks and his muscle strength in lower extremities appears to be well-preserved     Assessment &  Plan:   Diagnoses and all orders for this visit:  Neck pain  Long term current use of opiate analgesic  Chronic pain syndrome  Pain in both hands  Disc disease, degenerative, lumbar or lumbosacral  Facet arthritis of lumbar region ([place]) -     LUMBAR FACET(MEDIAL BRANCH NERVE BLOCK) MBNB; Future  Other orders -     HYDROcodone-acetaminophen (NORCO) 7.5-325 MG tablet; Take 1 tablet by mouth every 6 (six) hours as needed for moderate pain or severe pain.     ----------------------------------------------------------------------------------------------------------------------  Problem List Items Addressed This Visit      Musculoskeletal and Integument   Disc disease, degenerative, lumbar or lumbosacral (Chronic)   Relevant Medications   HYDROcodone-acetaminophen (NORCO) 7.5-325 MG tablet     Other   Chronic pain syndrome (Chronic)   Hand pain (Chronic)   Long term current use of opiate analgesic (Chronic)   Neck pain - Primary (Chronic)    Other Visit Diagnoses    Facet arthritis of lumbar region ([place])       Relevant Medications   HYDROcodone-acetaminophen (NORCO) 7.5-325 MG tablet   Other Relevant Orders   LUMBAR FACET(MEDIAL BRANCH NERVE BLOCK) MBNB      ----------------------------------------------------------------------------------------------------------------------  1. Neck pain Continue with stretching strengthening exercises and we talked about limitations regarding weight lifting and exercises that may increase cervical strain thereby precipitating tension headaches and migrainous component. He can continue with the butalbital per Dr. Doy Hutching on a when necessary basis as this does seem to help break his migraine headaches.  2. Long term current use of opiate analgesic We talked about reducing his opioid dose over time and I'm going to start him at 7.5 mg tablets 3 times a day with an objective of weaning this over the next few months. In the meantime we will  plan on a diagnostic lumbar facet block to see if this can get him some relief. We've also talked about continuing stretching strengthening exercises for his low back.  3. Chronic pain syndrome As above  4. Pain in both hands As above   5. Disc disease, degenerative, lumbar or lumbosacral As above  6. Facet arthritis of lumbar region [facility]) As above.. The risks and benefits of the procedure have been reviewed and full detail and his questions answered. Our plan is for return in one month for this diagnostic block - LUMBAR FACET(MEDIAL BRANCH NERVE BLOCK) MBNB; Future    ----------------------------------------------------------------------------------------------------------------------  I have discontinued Mr. Rodger HYDROcodone-acetaminophen. I am also having him start on HYDROcodone-acetaminophen. Additionally, I am having him maintain his butalbital-acetaminophen-caffeine, amphetamine-dextroamphetamine, esomeprazole, mesalamine, and Aspirin-Acetaminophen-Caffeine (EXCEDRIN MIGRAINE PO).   Meds ordered this encounter  Medications  . HYDROcodone-acetaminophen (NORCO) 7.5-325 MG tablet    Sig: Take 1 tablet by mouth every 6 (six) hours as needed for moderate pain or severe pain.    Dispense:  90 tablet    Refill:  0       Follow-up: Return in about 1 month (around 04/27/2017) for evaluation, procedure.    Molli Barrows, MD 7:34 PM  The  practitioner database for opioid medications on this patient has been reviewed by me and my staff   Greater than 50% of  the total encounter time was spent in counseling and / or coordination of care.     This dictation was performed utilizing Systems analyst.  Please excuse any unintentional or mistaken typographical errors as a result.

## 2017-03-28 NOTE — Progress Notes (Signed)
Safety precautions to be maintained throughout the outpatient stay will include: orient to surroundings, keep bed in low position, maintain call bell within reach at all times, provide assistance with transfer out of bed and ambulation.  

## 2017-03-28 NOTE — Patient Instructions (Signed)
GENERAL RISKS AND COMPLICATIONS  What are the risk, side effects and possible complications? Generally speaking, most procedures are safe.  However, with any procedure there are risks, side effects, and the possibility of complications.  The risks and complications are dependent upon the sites that are lesioned, or the type of nerve block to be performed.  The closer the procedure is to the spine, the more serious the risks are.  Great care is taken when placing the radio frequency needles, block needles or lesioning probes, but sometimes complications can occur. 1. Infection: Any time there is an injection through the skin, there is a risk of infection.  This is why sterile conditions are used for these blocks.  There are four possible types of infection. 1. Localized skin infection. 2. Central Nervous System Infection-This can be in the form of Meningitis, which can be deadly. 3. Epidural Infections-This can be in the form of an epidural abscess, which can cause pressure inside of the spine, causing compression of the spinal cord with subsequent paralysis. This would require an emergency surgery to decompress, and there are no guarantees that the patient would recover from the paralysis. 4. Discitis-This is an infection of the intervertebral discs.  It occurs in about 1% of discography procedures.  It is difficult to treat and it may lead to surgery.        2. Pain: the needles have to go through skin and soft tissues, will cause soreness.       3. Damage to internal structures:  The nerves to be lesioned may be near blood vessels or    other nerves which can be potentially damaged.       4. Bleeding: Bleeding is more common if the patient is taking blood thinners such as  aspirin, Coumadin, Ticiid, Plavix, etc., or if he/she have some genetic predisposition  such as hemophilia. Bleeding into the spinal canal can cause compression of the spinal  cord with subsequent paralysis.  This would require an  emergency surgery to  decompress and there are no guarantees that the patient would recover from the  paralysis.       5. Pneumothorax:  Puncturing of a lung is a possibility, every time a needle is introduced in  the area of the chest or upper back.  Pneumothorax refers to free air around the  collapsed lung(s), inside of the thoracic cavity (chest cavity).  Another two possible  complications related to a similar event would include: Hemothorax and Chylothorax.   These are variations of the Pneumothorax, where instead of air around the collapsed  lung(s), you may have blood or chyle, respectively.       6. Spinal headaches: They may occur with any procedures in the area of the spine.       7. Persistent CSF (Cerebro-Spinal Fluid) leakage: This is a rare problem, but may occur  with prolonged intrathecal or epidural catheters either due to the formation of a fistulous  track or a dural tear.       8. Nerve damage: By working so close to the spinal cord, there is always a possibility of  nerve damage, which could be as serious as a permanent spinal cord injury with  paralysis.       9. Death:  Although rare, severe deadly allergic reactions known as "Anaphylactic  reaction" can occur to any of the medications used.      10. Worsening of the symptoms:  We can always make thing worse.    What are the chances of something like this happening? Chances of any of this occuring are extremely low.  By statistics, you have more of a chance of getting killed in a motor vehicle accident: while driving to the hospital than any of the above occurring .  Nevertheless, you should be aware that they are possibilities.  In general, it is similar to taking a shower.  Everybody knows that you can slip, hit your head and get killed.  Does that mean that you should not shower again?  Nevertheless always keep in mind that statistics do not mean anything if you happen to be on the wrong side of them.  Even if a procedure has a 1  (one) in a 1,000,000 (million) chance of going wrong, it you happen to be that one..Also, keep in mind that by statistics, you have more of a chance of having something go wrong when taking medications.  Who should not have this procedure? If you are on a blood thinning medication (e.g. Coumadin, Plavix, see list of "Blood Thinners"), or if you have an active infection going on, you should not have the procedure.  If you are taking any blood thinners, please inform your physician.  How should I prepare for this procedure?  Do not eat or drink anything at least six hours prior to the procedure.  Bring a driver with you .  It cannot be a taxi.  Come accompanied by an adult that can drive you back, and that is strong enough to help you if your legs get weak or numb from the local anesthetic.  Take all of your medicines the morning of the procedure with just enough water to swallow them.  If you have diabetes, make sure that you are scheduled to have your procedure done first thing in the morning, whenever possible.  If you have diabetes, take only half of your insulin dose and notify our nurse that you have done so as soon as you arrive at the clinic.  If you are diabetic, but only take blood sugar pills (oral hypoglycemic), then do not take them on the morning of your procedure.  You may take them after you have had the procedure.  Do not take aspirin or any aspirin-containing medications, at least eleven (11) days prior to the procedure.  They may prolong bleeding.  Wear loose fitting clothing that may be easy to take off and that you would not mind if it got stained with Betadine or blood.  Do not wear any jewelry or perfume  Remove any nail coloring.  It will interfere with some of our monitoring equipment.  NOTE: Remember that this is not meant to be interpreted as a complete list of all possible complications.  Unforeseen problems may occur.  BLOOD THINNERS The following drugs  contain aspirin or other products, which can cause increased bleeding during surgery and should not be taken for 2 weeks prior to and 1 week after surgery.  If you should need take something for relief of minor pain, you may take acetaminophen which is found in Tylenol,m Datril, Anacin-3 and Panadol. It is not blood thinner. The products listed below are.  Do not take any of the products listed below in addition to any listed on your instruction sheet.  A.P.C or A.P.C with Codeine Codeine Phosphate Capsules #3 Ibuprofen Ridaura  ABC compound Congesprin Imuran rimadil  Advil Cope Indocin Robaxisal  Alka-Seltzer Effervescent Pain Reliever and Antacid Coricidin or Coricidin-D  Indomethacin Rufen    Alka-Seltzer plus Cold Medicine Cosprin Ketoprofen S-A-C Tablets  Anacin Analgesic Tablets or Capsules Coumadin Korlgesic Salflex  Anacin Extra Strength Analgesic tablets or capsules CP-2 Tablets Lanoril Salicylate  Anaprox Cuprimine Capsules Levenox Salocol  Anexsia-D Dalteparin Magan Salsalate  Anodynos Darvon compound Magnesium Salicylate Sine-off  Ansaid Dasin Capsules Magsal Sodium Salicylate  Anturane Depen Capsules Marnal Soma  APF Arthritis pain formula Dewitt's Pills Measurin Stanback  Argesic Dia-Gesic Meclofenamic Sulfinpyrazone  Arthritis Bayer Timed Release Aspirin Diclofenac Meclomen Sulindac  Arthritis pain formula Anacin Dicumarol Medipren Supac  Analgesic (Safety coated) Arthralgen Diffunasal Mefanamic Suprofen  Arthritis Strength Bufferin Dihydrocodeine Mepro Compound Suprol  Arthropan liquid Dopirydamole Methcarbomol with Aspirin Synalgos  ASA tablets/Enseals Disalcid Micrainin Tagament  Ascriptin Doan's Midol Talwin  Ascriptin A/D Dolene Mobidin Tanderil  Ascriptin Extra Strength Dolobid Moblgesic Ticlid  Ascriptin with Codeine Doloprin or Doloprin with Codeine Momentum Tolectin  Asperbuf Duoprin Mono-gesic Trendar  Aspergum Duradyne Motrin or Motrin IB Triminicin  Aspirin  plain, buffered or enteric coated Durasal Myochrisine Trigesic  Aspirin Suppositories Easprin Nalfon Trillsate  Aspirin with Codeine Ecotrin Regular or Extra Strength Naprosyn Uracel  Atromid-S Efficin Naproxen Ursinus  Auranofin Capsules Elmiron Neocylate Vanquish  Axotal Emagrin Norgesic Verin  Azathioprine Empirin or Empirin with Codeine Normiflo Vitamin E  Azolid Emprazil Nuprin Voltaren  Bayer Aspirin plain, buffered or children's or timed BC Tablets or powders Encaprin Orgaran Warfarin Sodium  Buff-a-Comp Enoxaparin Orudis Zorpin  Buff-a-Comp with Codeine Equegesic Os-Cal-Gesic   Buffaprin Excedrin plain, buffered or Extra Strength Oxalid   Bufferin Arthritis Strength Feldene Oxphenbutazone   Bufferin plain or Extra Strength Feldene Capsules Oxycodone with Aspirin   Bufferin with Codeine Fenoprofen Fenoprofen Pabalate or Pabalate-SF   Buffets II Flogesic Panagesic   Buffinol plain or Extra Strength Florinal or Florinal with Codeine Panwarfarin   Buf-Tabs Flurbiprofen Penicillamine   Butalbital Compound Four-way cold tablets Penicillin   Butazolidin Fragmin Pepto-Bismol   Carbenicillin Geminisyn Percodan   Carna Arthritis Reliever Geopen Persantine   Carprofen Gold's salt Persistin   Chloramphenicol Goody's Phenylbutazone   Chloromycetin Haltrain Piroxlcam   Clmetidine heparin Plaquenil   Cllnoril Hyco-pap Ponstel   Clofibrate Hydroxy chloroquine Propoxyphen         Before stopping any of these medications, be sure to consult the physician who ordered them.  Some, such as Coumadin (Warfarin) are ordered to prevent or treat serious conditions such as "deep thrombosis", "pumonary embolisms", and other heart problems.  The amount of time that you may need off of the medication may also vary with the medication and the reason for which you were taking it.  If you are taking any of these medications, please make sure you notify your pain physician before you undergo any  procedures.         Facet Blocks Patient Information  Description: The facets are joints in the spine between the vertebrae.  Like any joints in the body, facets can become irritated and painful.  Arthritis can also effect the facets.  By injecting steroids and local anesthetic in and around these joints, we can temporarily block the nerve supply to them.  Steroids act directly on irritated nerves and tissues to reduce selling and inflammation which often leads to decreased pain.  Facet blocks may be done anywhere along the spine from the neck to the low back depending upon the location of your pain.   After numbing the skin with local anesthetic (like Novocaine), a small needle is passed onto the facet joints under x-ray guidance.    You may experience a sensation of pressure while this is being done.  The entire block usually lasts about 15-25 minutes.   Conditions which may be treated by facet blocks:   Low back/buttock pain  Neck/shoulder pain  Certain types of headaches  Preparation for the injection:  1. Do not eat any solid food or dairy products within 8 hours of your appointment. 2. You may drink clear liquid up to 3 hours before appointment.  Clear liquids include water, black coffee, juice or soda.  No milk or cream please. 3. You may take your regular medication, including pain medications, with a sip of water before your appointment.  Diabetics should hold regular insulin (if taken separately) and take 1/2 normal NPH dose the morning of the procedure.  Carry some sugar containing items with you to your appointment. 4. A driver must accompany you and be prepared to drive you home after your procedure. 5. Bring all your current medications with you. 6. An IV may be inserted and sedation may be given at the discretion of the physician. 7. A blood pressure cuff, EKG and other monitors will often be applied during the procedure.  Some patients may need to have extra oxygen  administered for a short period. 8. You will be asked to provide medical information, including your allergies and medications, prior to the procedure.  We must know immediately if you are taking blood thinners (like Coumadin/Warfarin) or if you are allergic to IV iodine contrast (dye).  We must know if you could possible be pregnant.  Possible side-effects:   Bleeding from needle site  Infection (rare, may require surgery)  Nerve injury (rare)  Numbness & tingling (temporary)  Difficulty urinating (rare, temporary)  Spinal headache (a headache worse with upright posture)  Light-headedness (temporary)  Pain at injection site (serveral days)  Decreased blood pressure (rare, temporary)  Weakness in arm/leg (temporary)  Pressure sensation in back/neck (temporary)   Call if you experience:   Fever/chills associated with headache or increased back/neck pain  Headache worsened by an upright position  New onset, weakness or numbness of an extremity below the injection site  Hives or difficulty breathing (go to the emergency room)  Inflammation or drainage at the injection site(s)  Severe back/neck pain greater than usual  New symptoms which are concerning to you  Please note:  Although the local anesthetic injected can often make your back or neck feel good for several hours after the injection, the pain will likely return. It takes 3-7 days for steroids to work.  You may not notice any pain relief for at least one week.  If effective, we will often do a series of 2-3 injections spaced 3-6 weeks apart to maximally decrease your pain.  After the initial series, you may be a candidate for a more permanent nerve block of the facets.  If you have any questions, please call [phone] [facility] 

## 2017-04-14 DIAGNOSIS — G8929 Other chronic pain: Secondary | ICD-10-CM | POA: Insufficient documentation

## 2017-04-14 DIAGNOSIS — K529 Noninfective gastroenteritis and colitis, unspecified: Secondary | ICD-10-CM | POA: Insufficient documentation

## 2017-04-14 DIAGNOSIS — K5090 Crohn's disease, unspecified, without complications: Secondary | ICD-10-CM | POA: Insufficient documentation

## 2017-04-28 ENCOUNTER — Ambulatory Visit (INDEPENDENT_AMBULATORY_CARE_PROVIDER_SITE_OTHER): Payer: Managed Care, Other (non HMO) | Admitting: Family Medicine

## 2017-04-28 ENCOUNTER — Encounter: Payer: Self-pay | Admitting: Family Medicine

## 2017-04-28 VITALS — BP 118/79 | HR 60 | Temp 97.6°F | Ht 70.2 in | Wt 198.3 lb

## 2017-04-28 DIAGNOSIS — R51 Headache: Secondary | ICD-10-CM | POA: Diagnosis not present

## 2017-04-28 DIAGNOSIS — F909 Attention-deficit hyperactivity disorder, unspecified type: Secondary | ICD-10-CM

## 2017-04-28 DIAGNOSIS — G43009 Migraine without aura, not intractable, without status migrainosus: Secondary | ICD-10-CM

## 2017-04-28 DIAGNOSIS — K50919 Crohn's disease, unspecified, with unspecified complications: Secondary | ICD-10-CM | POA: Diagnosis not present

## 2017-04-28 DIAGNOSIS — G8929 Other chronic pain: Secondary | ICD-10-CM

## 2017-04-28 DIAGNOSIS — G894 Chronic pain syndrome: Secondary | ICD-10-CM | POA: Diagnosis not present

## 2017-04-28 MED ORDER — PROPRANOLOL HCL ER 60 MG PO CP24: 60.0000 mg | ORAL_CAPSULE | Freq: Every day | ORAL | 2 refills | Status: DC

## 2017-04-28 NOTE — Progress Notes (Signed)
BP 118/79 (BP Location: Left Arm, Patient Position: Sitting, Cuff Size: Large)   Pulse 60   Temp 97.6 F (36.4 C)   Ht 5' 10.2" (1.783 m)   Wt 198 lb 5 oz (90 kg)   SpO2 98%   BMI 28.29 kg/m    Subjective:    Patient ID: Stephen Wise, male    DOB: Apr 24, 1971, 46 y.o.   MRN: [id number]  HPI: Stephen Wise is a 46 y.o. male who presents today to establish care.   Chief Complaint  Patient presents with  . Establish Care   MIGRAINES Duration: 4 years Onset: sudden Severity: severe Quality: sharp Frequency: 1x a week Location: L temporal  Headache duration: 15 minutes to hours Radiation: into the back of his head Time of day headache occurs: at random Alleviating factors: fiorecet Aggravating factors: sunlight, reflections, problems with his vision, got worse with a change in his contacts  Headache status at time of visit: current headache Treatments attempted: imitrex, maxalt, fiorecet, fiorcet with codiene, topamax, amitriptyline    Aura: no Nausea:  yes Vomiting: no Photophobia:  yes Phonophobia:  no Effect on social functioning:  yes Numbers of missed days of school/work each month: gone in late at least 3-4x a month Confusion:  yes Gait disturbance/ataxia:  no Behavioral changes:  yes Fevers:  no  Had imaging done a few years ago- had it done at [facility]  ADHD- Diagnosed about 7 years ago by his primary. Has been on adderall since then.  ADHD status: stable Satisfied with current therapy: yes Medication compliance:  excellent compliance Controlled substance contract: yes- at his previous doctor, broke contract Previous psychiatry evaluation: yes Previous medications: no    Taking meds on weekends/vacations: yes Work/school performance:  good Difficulty sustaining attention/completing tasks: yes Distracted by extraneous stimuli: no Does not listen when spoken to: no  Fidgets with hands or feet: no Unable to stay in seat: no Blurts out/interrupts others: no ADHD  Medication Side Effects: no    Active Ambulatory Problems    Diagnosis Date Noted  . Chronic pain syndrome 03/08/2017  . Long term current use of opiate analgesic 03/08/2017  . Adult ADHD 11/12/2014  . Chronic cough 03/13/2014  . Chronic headaches 03/13/2014  . Chronic prostatitis 11/26/2015  . Condyloma acuminatum of penis 11/26/2015  . Crohn's disease ([place]) 03/13/2014  . Disc disease, degenerative, lumbar or lumbosacral 03/13/2014  . Gastric ulcer 03/13/2014  . Hypoglycemia 03/13/2014  . Microcytic anemia 03/13/2014  . Pain syndrome, chronic 11/12/2014  . Peyronie's disease 11/26/2015  . Neck pain 03/08/2017  . Hand pain 03/08/2017  . Chronic pain   . Inflammatory bowel disease    Resolved Ambulatory Problems    Diagnosis Date Noted  . Crohn disease ([place])    Past Medical History:  Diagnosis Date  . Adult ADHD   . Chronic headaches   . Chronic pain   . Chronic prostatitis   . Condyloma acuminatum of penis   . Crohn disease ([place])   . ED (erectile dysfunction)   . Gastric ulcer   . Inflammatory bowel disease   . Microcytic anemia   . Peyronie's disease    Past Surgical History:  Procedure Laterality Date  . COLON SURGERY  2000   removal of colon and small intestine due to Crohn's  . COLONOSCOPY  07/21/2015   also done in 10/10, 6/00  . ESOPHAGOGASTRODUODENOSCOPY  03/08/1999   Outpatient Encounter Prescriptions as of 04/28/2017  Medication Sig  . amphetamine-dextroamphetamine (ADDERALL)  10 MG tablet Take 1 tablet by mouth 2 (two) times daily.  . Aspirin-Acetaminophen-Caffeine (EXCEDRIN MIGRAINE PO) Take by mouth.  . butalbital-acetaminophen-caffeine (FIORICET WITH CODEINE) 50-325-40-30 MG capsule Take 1 capsule by mouth every 4 (four) hours.  Marland Kitchen HYDROcodone-acetaminophen (NORCO) 7.5-325 MG tablet Take 1 tablet by mouth every 6 (six) hours as needed for moderate pain or severe pain.  . mesalamine (PENTASA) 500 MG CR capsule Take 1,000 mg by mouth 3 (three) times  daily as needed.   . propranolol ER (INDERAL LA) 60 MG 24 hr capsule Take 1 capsule (60 mg total) by mouth daily.  . [DISCONTINUED] esomeprazole (NEXIUM) 40 MG capsule Take 40 mg by mouth every morning.   No facility-administered encounter medications on file as of 04/28/2017.    No Known Allergies Family History  Problem Relation Age of Onset  . Irritable bowel syndrome Mother   . Cirrhosis Father   . Alzheimer's disease Maternal Grandmother    Social History   Social History  . Marital status: Married    Spouse name: N/A  . Number of children: N/A  . Years of education: N/A   Social History Main Topics  . Smoking status: Never Smoker  . Smokeless tobacco: Never Used  . Alcohol use No  . Drug use: No  . Sexual activity: Yes   Other Topics Concern  . None   Social History Narrative  . None   Review of Systems  Constitutional: Negative.   Eyes: Positive for photophobia. Negative for pain, discharge, redness, itching and visual disturbance.  Respiratory: Negative.   Cardiovascular: Negative.   Neurological: Positive for headaches. Negative for dizziness, tremors, seizures, syncope, facial asymmetry, speech difficulty, weakness, light-headedness and numbness.  Psychiatric/Behavioral: Positive for decreased concentration. Negative for agitation, behavioral problems, confusion, dysphoric mood, hallucinations, self-injury, sleep disturbance and suicidal ideas. The patient is hyperactive. The patient is not nervous/anxious.     Per HPI unless specifically indicated above     Objective:    BP 118/79 (BP Location: Left Arm, Patient Position: Sitting, Cuff Size: Large)   Pulse 60   Temp 97.6 F (36.4 C)   Ht 5' 10.2" (1.783 m)   Wt 198 lb 5 oz (90 kg)   SpO2 98%   BMI 28.29 kg/m   Wt Readings from Last 3 Encounters:  04/28/17 198 lb 5 oz (90 kg)  03/28/17 195 lb (88.5 kg)  03/08/17 192 lb (87.1 kg)    Physical Exam  Constitutional: He is oriented to person, place,  and time. He appears well-developed and well-nourished. No distress.  HENT:  Head: Normocephalic and atraumatic.  Right Ear: Hearing normal.  Left Ear: Hearing normal.  Nose: Nose normal.  Eyes: Conjunctivae and lids are normal. Right eye exhibits no discharge. Left eye exhibits no discharge. No scleral icterus.  Cardiovascular: Normal rate, regular rhythm, normal heart sounds and intact distal pulses.  Exam reveals no gallop and no friction rub.   No murmur heard. Pulmonary/Chest: Effort normal and breath sounds normal. No respiratory distress. He has no wheezes. He has no rales. He exhibits no tenderness.  Musculoskeletal: Normal range of motion.  Neurological: He is alert and oriented to person, place, and time.  Skin: Skin is warm, dry and intact. No rash noted. No erythema. No pallor.  Psychiatric: He has a normal mood and affect. His speech is normal and behavior is normal. Judgment and thought content normal. Cognition and memory are normal.  Nursing note and vitals reviewed.   Results for  orders placed or performed in visit on 03/08/17  Compliance Drug Analysis, Ur  Result Value Ref Range   Summary FINAL       Assessment & Plan:   Problem List Items Addressed This Visit      Digestive   Crohn's disease ([place])    Continue to follow with GI. Call with any concerns.         Other   Chronic headaches - Primary (Chronic)    Will start him on propranolol for migraine prophylaxis. Discussed that we do not prescribe any controlled substances on the first visit. If not doing significantly better on propranolol in 2-4 weeks, will send to neurology for treatment of migraine ?botox. Will obtain MRI of his head as he's never had one for baseline.       Relevant Medications   propranolol ER (INDERAL LA) 60 MG 24 hr capsule   Adult ADHD    Discussed that we do not prescribe controlled substances on the first visit. Will obtain records from previous provider and consider taking over  his ADD treatment.       Chronic pain    Following with pain clinic. Call with any concerns. Continue to monitor.        Other Visit Diagnoses    Migraine without aura and without status migrainosus, not intractable       Relevant Medications   propranolol ER (INDERAL LA) 60 MG 24 hr capsule   Other Relevant Orders   MR Brain Wo Contrast       Follow up plan: Return 3 weeks.

## 2017-05-01 NOTE — Assessment & Plan Note (Signed)
Continue to follow with GI. Call with any concerns.

## 2017-05-01 NOTE — Assessment & Plan Note (Signed)
Discussed that we do not prescribe controlled substances on the first visit. Will obtain records from previous provider and consider taking over his ADD treatment.

## 2017-05-01 NOTE — Assessment & Plan Note (Signed)
Following with pain clinic. Call with any concerns. Continue to monitor.

## 2017-05-01 NOTE — Assessment & Plan Note (Addendum)
Will start him on propranolol for migraine prophylaxis. Discussed that we do not prescribe any controlled substances on the first visit. If not doing significantly better on propranolol in 2-4 weeks, will send to neurology for treatment of migraine ?botox. Will obtain MRI of his head as he's never had one for baseline.

## 2017-05-03 ENCOUNTER — Encounter: Payer: Self-pay | Admitting: Anesthesiology

## 2017-05-03 ENCOUNTER — Ambulatory Visit: Payer: Managed Care, Other (non HMO) | Attending: Anesthesiology | Admitting: Anesthesiology

## 2017-05-03 VITALS — BP 138/82 | HR 74 | Temp 97.6°F | Resp 14 | Ht 70.0 in | Wt 195.0 lb

## 2017-05-03 DIAGNOSIS — M542 Cervicalgia: Secondary | ICD-10-CM | POA: Diagnosis not present

## 2017-05-03 DIAGNOSIS — M545 Low back pain: Secondary | ICD-10-CM | POA: Diagnosis not present

## 2017-05-03 DIAGNOSIS — M4696 Unspecified inflammatory spondylopathy, lumbar region: Secondary | ICD-10-CM | POA: Diagnosis not present

## 2017-05-03 DIAGNOSIS — G8929 Other chronic pain: Secondary | ICD-10-CM | POA: Diagnosis not present

## 2017-05-03 DIAGNOSIS — Z79891 Long term (current) use of opiate analgesic: Secondary | ICD-10-CM | POA: Diagnosis not present

## 2017-05-03 DIAGNOSIS — G894 Chronic pain syndrome: Secondary | ICD-10-CM

## 2017-05-03 DIAGNOSIS — M5137 Other intervertebral disc degeneration, lumbosacral region: Secondary | ICD-10-CM | POA: Diagnosis not present

## 2017-05-03 DIAGNOSIS — M47816 Spondylosis without myelopathy or radiculopathy, lumbar region: Secondary | ICD-10-CM

## 2017-05-03 MED ORDER — HYDROCODONE-ACETAMINOPHEN 7.5-325 MG PO TABS: 1.0000 | ORAL_TABLET | Freq: Four times a day (QID) | ORAL | 0 refills | Status: DC | PRN

## 2017-05-03 NOTE — Progress Notes (Signed)
Nursing Pain Medication Assessment:  Safety precautions to be maintained throughout the outpatient stay will include: orient to surroundings, keep bed in low position, maintain call bell within reach at all times, provide assistance with transfer out of bed and ambulation.  Medication Inspection Compliance: Pill count conducted under aseptic conditions, in front of the patient. Neither the pills nor the bottle was removed from the patient's sight at any time. Once count was completed pills were immediately returned to the patient in their original bottle.  Medication: Hydrocodone/APAP Pill/Patch Count: 0 of 90 pills remain Pill/Patch Appearance: Markings consistent with prescribed medication Bottle Appearance: Standard pharmacy container. Clearly labeled. Filled Date:06/26/ 2018 Last Medication intake:  Today

## 2017-05-03 NOTE — Progress Notes (Signed)
Subjective:  Patient ID: Stephen Wise, male    DOB: 27-Feb-1971  Age: 46 y.o. MRN: [id number]  CC: Back Pain (lower)     PROCEDURE:None  HPI Stephen Wise presents for reevaluation last seen a month ago. He is doing well with his medications and these seem to help with his low back pain. He's currently taking hydrocodone 7.5 mg tablets up to 3 times a day and using these for days where he has severe pain. Based on his narcotic assessment sheet these seem to help with his overall lifestyle function and keep his back pain under reasonable control. Previously we've requested that we proceed with a trigger point injection for his low back lumbar pain and that has not been planned as of yet and we will re-request this at this time. The quality characteristic addition we should've his low back pain a been stable in nature primarily is a gnawing aching pain with exacerbation from work related activities and lifting. He's been trying to do his exercises for his low back including stretching strengthening with some success. No change in lower extremity strength or function or bowel bladder function noted this time. Furthermore he has had some migraines but these been stable in nature and he continues to follow up with his primary care physicians for his headache management.   History Stephen Wise has a past medical history of Adult ADHD; Chronic headaches; Chronic pain; Chronic prostatitis; Condyloma acuminatum of penis; Crohn disease ([place]); ED (erectile dysfunction); Gastric ulcer; Inflammatory bowel disease; Microcytic anemia; and Peyronie's disease.   He has a past surgical history that includes Colon surgery (2000); Esophagogastroduodenoscopy (03/08/1999); and Colonoscopy (07/21/2015).   His family history includes Alzheimer's disease in his maternal grandmother; Cirrhosis in his father; Irritable bowel syndrome in his mother.He reports that he has never smoked. He has never used smokeless tobacco. He reports that  he does not drink alcohol or use drugs.    No results found for: TOXASSSELUR  Outpatient Medications Prior to Visit  Medication Sig Dispense Refill  . amphetamine-dextroamphetamine (ADDERALL) 10 MG tablet Take 1 tablet by mouth 2 (two) times daily.    . Aspirin-Acetaminophen-Caffeine (EXCEDRIN MIGRAINE PO) Take by mouth.    . butalbital-acetaminophen-caffeine (FIORICET WITH CODEINE) 50-325-40-30 MG capsule Take 1 capsule by mouth every 4 (four) hours.    . mesalamine (PENTASA) 500 MG CR capsule Take 1,000 mg by mouth 3 (three) times daily as needed.     Marland Kitchen HYDROcodone-acetaminophen (NORCO) 7.5-325 MG tablet Take 1 tablet by mouth every 6 (six) hours as needed for moderate pain or severe pain. 90 tablet 0  . propranolol ER (INDERAL LA) 60 MG 24 hr capsule Take 1 capsule (60 mg total) by mouth daily. (Patient not taking: Reported on 05/03/2017) 30 capsule 2   No facility-administered medications prior to visit.    No results found for: WBC, HGB, HCT, PLT, GLUCOSE, CHOL, TRIG, HDL, LDLDIRECT, LDLCALC, ALT, AST, NA, K, CL, CREATININE, BUN, CO2, TSH, PSA, INR, GLUF, HGBA1C, MICROALBUR  --------------------------------------------------------------------------------------------------------------------- Dg Cervical Spine Complete  Result Date: 03/11/2017 CLINICAL DATA:  Chronic cervicalgia with upper extremity tingling EXAM: CERVICAL SPINE - COMPLETE 4+ VIEW COMPARISON:  None. FINDINGS: Frontal, lateral, open-mouth odontoid, and bilateral oblique views were obtained. There is no fracture or spondylolisthesis. Prevertebral soft tissues and predental space regions are normal. The disc spaces appear unremarkable. There is no appreciable exit foraminal narrowing on the oblique views. Lung apices are clear. IMPRESSION: No fracture or spondylolisthesis.  No appreciable arthropathy. Electronically Signed  By: Lowella Grip III M.D.   On: 03/11/2017 10:56   Dg Lumbar Spine Complete W/bend  Result Date:  03/11/2017 CLINICAL DATA:  Chronic pain EXAM: LUMBAR SPINE - COMPLETE WITH BENDING VIEWS COMPARISON:  None. FINDINGS: Standing frontal, standing neutral lateral, standing flexion lateral, standing extension lateral, spot lumbosacral lateral, and bilateral oblique views were obtained. There are 5 non-rib-bearing lumbar type vertebral bodies. There is mild lower lumbar dextroscoliosis. There is no fracture. There is no spondylolisthesis on neutral lateral imaging. There is no appreciable change in lateral alignment with flexion and extension. The disc spaces appear unremarkable. Facet regions appear unremarkable. IMPRESSION: No fracture or spondylolisthesis. No appreciable change in lateral alignment with flexion-extension. No appreciable arthropathy. Electronically Signed   By: Lowella Grip III M.D.   On: 03/11/2017 10:54   Dg Hand 2 View Right  Result Date: 03/11/2017 CLINICAL DATA:  Chronic pain and tingling EXAM: RIGHT HAND - 2 VIEW COMPARISON:  None. FINDINGS: Frontal and lateral views were obtained. Bony mineralization is normal. No fracture or dislocation. The joint spaces appear normal. No erosive change or periostitis. IMPRESSION: No appreciable arthropathy.  No fracture or dislocation. Electronically Signed   By: Lowella Grip III M.D.   On: 03/11/2017 10:56   Dg Hand 2 View Left  Result Date: 03/11/2017 CLINICAL DATA:  Chronic bilateral hand pain.  No reported injury. EXAM: LEFT HAND - 2 VIEW COMPARISON:  None. FINDINGS: No acute fracture or dislocation. Tiny well corticated 1 mm density adjacent to the volar plate in the middle phalanx of the left third finger. No suspicious focal osseous lesion. No appreciable arthropathy. No radiopaque foreign body. IMPRESSION: Tiny well corticated 1 mm density adjacent to the volar plate in the middle phalanx of the left third finger, which may represent the sequela of a remote avulsion injury. No acute osseous injury or appreciable arthropathy in the left  hand. Electronically Signed   By: Ilona Sorrel M.D.   On: 03/11/2017 10:56       ---------------------------------------------------------------------------------------------------------------------- Past Medical History:  Diagnosis Date  . Adult ADHD   . Chronic headaches   . Chronic pain   . Chronic prostatitis   . Condyloma acuminatum of penis   . Crohn disease ([place])    with terminal ileitis  . ED (erectile dysfunction)   . Gastric ulcer   . Inflammatory bowel disease   . Microcytic anemia   . Peyronie's disease     Past Surgical History:  Procedure Laterality Date  . COLON SURGERY  2000   removal of colon and small intestine due to Crohn's  . COLONOSCOPY  07/21/2015   also done in 10/10, 6/00  . ESOPHAGOGASTRODUODENOSCOPY  03/08/1999    Family History  Problem Relation Age of Onset  . Irritable bowel syndrome Mother   . Cirrhosis Father   . Alzheimer's disease Maternal Grandmother     Social History  Substance Use Topics  . Smoking status: Never Smoker  . Smokeless tobacco: Never Used  . Alcohol use No    ---------------------------------------------------------------------------------------------------------------------   BP 138/82   Pulse 74   Temp 97.6 F (36.4 C) (Oral)   Resp 14   Ht 5' 10"  (1.778 m)   Wt 195 lb (88.5 kg)   SpO2 100%   BMI 27.98 kg/m    BP Readings from Last 3 Encounters:  05/03/17 138/82  04/28/17 118/79  03/28/17 127/84     Wt Readings from Last 3 Encounters:  05/03/17 195 lb (88.5 kg)  04/28/17 198 lb 5 oz (90 kg)  03/28/17 195 lb (88.5 kg)     ----------------------------------------------------------------------------------------------------------------------  ROS Review of Systems  No complaints of constipation Cardiac: No angina or shortness of breath CNS: No dizziness or sedation  Objective:  BP 138/82   Pulse 74   Temp 97.6 F (36.4 C) (Oral)   Resp 14   Ht 5' 10"  (1.778 m)   Wt 195 lb (88.5  kg)   SpO2 100%   BMI 27.98 kg/m   Physical Exam patient is alert oriented cooperative compliant Heart is regular rate and rhythm Inspection low back reveals trigger points in the bilateral lumbar paraspinous muscle at L5. His strength of the lower extremities appears at baseline with good muscle tone and bulk     Assessment & Plan:   Stephen Wise was seen today for back pain.  Diagnoses and all orders for this visit:  Neck pain  Long term current use of opiate analgesic  Chronic pain syndrome -     TRIGGER POINT INJECTION; Future  Disc disease, degenerative, lumbar or lumbosacral  Facet arthritis of lumbar region [facility])  Chronic bilateral low back pain without sciatica -     TRIGGER POINT INJECTION; Future  Other orders -     HYDROcodone-acetaminophen (NORCO) 7.5-325 MG tablet; Take 1 tablet by mouth every 6 (six) hours as needed for moderate pain or severe pain.     ----------------------------------------------------------------------------------------------------------------------  Problem List Items Addressed This Visit      Musculoskeletal and Integument   Disc disease, degenerative, lumbar or lumbosacral (Chronic)   Relevant Medications   HYDROcodone-acetaminophen (NORCO) 7.5-325 MG tablet     Other   Chronic pain syndrome (Chronic)   Relevant Orders   TRIGGER POINT INJECTION   Long term current use of opiate analgesic (Chronic)   Neck pain - Primary (Chronic)    Other Visit Diagnoses    Facet arthritis of lumbar region ([place])       Relevant Medications   HYDROcodone-acetaminophen (NORCO) 7.5-325 MG tablet   Chronic bilateral low back pain without sciatica       Relevant Medications   HYDROcodone-acetaminophen (NORCO) 7.5-325 MG tablet   Other Relevant Orders   TRIGGER POINT INJECTION      ----------------------------------------------------------------------------------------------------------------------  1. Neck pain Continue with stretching  strengthening exercises and we talked extensively about some exercises for his lumbar and cervical pain.  2. Long term current use of opiate analgesic We will refill his medications for hydrocodone 7.5 mg tablets to be taken 3 times a day when necessary. We will try to wean these down in intensity over the next several months. 3. Chronic pain syndrome As above  4. Pain in both hands As above   5. Disc disease, degenerative, lumbar or lumbosacral Going to schedule him for return in one month and a trigger point injection to the bilateral lumbar paraspinous muscle trigger points at that time.  6. Facet arthritis of lumbar region [facility]) As above.. The risks and benefits of the procedure have been reviewed and full detail and his questions answered. Our plan is for return in one month for this diagnostic block - LUMBAR FACET(MEDIAL BRANCH NERVE BLOCK) MBNB; Future    ----------------------------------------------------------------------------------------------------------------------  I am having Stephen Wise maintain his butalbital-acetaminophen-caffeine, amphetamine-dextroamphetamine, mesalamine, Aspirin-Acetaminophen-Caffeine (EXCEDRIN MIGRAINE PO), propranolol ER, and HYDROcodone-acetaminophen.   Meds ordered this encounter  Medications  . HYDROcodone-acetaminophen (NORCO) 7.5-325 MG tablet    Sig: Take 1 tablet by mouth every 6 (six) hours as needed for  moderate pain or severe pain.    Dispense:  90 tablet    Refill:  0       Follow-up: Return in about 1 month (around 06/03/2017) for procedure.    Molli Barrows, MD 3:13 PM  The [place] practitioner database for opioid medications on this patient has been reviewed by me and my staff   Greater than 50% of the total encounter time was spent in counseling and / or coordination of care.     This dictation was performed utilizing Systems analyst.  Please excuse any unintentional or mistaken typographical errors as a  result.

## 2017-05-12 ENCOUNTER — Ambulatory Visit (INDEPENDENT_AMBULATORY_CARE_PROVIDER_SITE_OTHER): Payer: Managed Care, Other (non HMO) | Admitting: Family Medicine

## 2017-05-12 ENCOUNTER — Encounter: Payer: Self-pay | Admitting: Family Medicine

## 2017-05-12 VITALS — BP 126/89 | HR 74 | Temp 98.3°F | Wt 199.3 lb

## 2017-05-12 DIAGNOSIS — G43009 Migraine without aura, not intractable, without status migrainosus: Secondary | ICD-10-CM | POA: Insufficient documentation

## 2017-05-12 DIAGNOSIS — F909 Attention-deficit hyperactivity disorder, unspecified type: Secondary | ICD-10-CM | POA: Diagnosis not present

## 2017-05-12 DIAGNOSIS — Z79899 Other long term (current) drug therapy: Secondary | ICD-10-CM | POA: Insufficient documentation

## 2017-05-12 MED ORDER — AMPHETAMINE-DEXTROAMPHETAMINE 10 MG PO TABS: 10.0000 mg | ORAL_TABLET | Freq: Two times a day (BID) | ORAL | 0 refills | Status: DC

## 2017-05-12 MED ORDER — BUTALBITAL-APAP-CAFF-COD 50-325-40-30 MG PO CAPS: 1.0000 | ORAL_CAPSULE | ORAL | 0 refills | Status: DC

## 2017-05-12 NOTE — Assessment & Plan Note (Signed)
For adderall and fioricet. We are aware of his chronic pain agreement, and are OK with those medicines coming from the pain clinic.

## 2017-05-12 NOTE — Assessment & Plan Note (Signed)
Doing better on his propranolol. Headaches less intense. Rx for fioricet with codiene given. Will try to take sparingly- this is OK by pain management. Controlled substance agreement signed today.

## 2017-05-12 NOTE — Progress Notes (Signed)
BP 126/89 (BP Location: Left Arm, Patient Position: Sitting, Cuff Size: Normal)   Pulse 74   Temp 98.3 F (36.8 C)   Wt 199 lb 5 oz (90.4 kg)   SpO2 99%   BMI 28.60 kg/m    Subjective:    Patient ID: Stephen Wise, male    DOB: 1971/01/26, 46 y.o.   MRN: [id number]  HPI: Stephen Wise is a 46 y.o. male  Chief Complaint  Patient presents with  . ADHD   MIGRAINES- started last visit on propranolol been on it for about 1.5 weeks, has had a couple of headaches since last visit. Noticed that the headaches are less intense this past week.  Duration: 4 years Onset: sudden Severity: severe Quality: sharp Frequency: 1x a week Location: L temporal  Headache duration: 15 minutes to hours Radiation: into the back of his head Time of day headache occurs: at random Alleviating factors: fiorecet Aggravating factors: sunlight, reflections, problems with his vision, got worse with a change in his contacts  Headache status at time of visit: current headache Treatments attempted: imitrex, maxalt, fiorecet, fiorcet with codiene, topamax, amitriptyline    Aura: no Nausea:  yes Vomiting: no Photophobia:  yes Phonophobia:  no Effect on social functioning:  yes Numbers of missed days of school/work each month: gone in late at least 3-4x a month Confusion:  yes Gait disturbance/ataxia:  no Behavioral changes:  yes Fevers:  no  Had imaging done a few years ago- had it [facility]  ADHD FOLLOW UP- records reviewed, has been getting adderall from Dr. Doy Hutching since at least 2016. No records available on how he was diagnosed. Not discussed in 2015 physical note. Telephone encounter from 07/11/14 reviewed- started on adderall. No formal testing done, he thinks that he had testing done in 2010, but cannot remember the name of the psychiatrist who did it.  ADHD status: stable Satisfied with current therapy: yes Medication compliance:  excellent compliance Controlled substance contract: yes- at his  previous doctor, broke contract, new contract with [place] today. Previous psychiatry evaluation: yes Previous medications: no    Taking meds on weekends/vacations: yes Work/school performance:  good Difficulty sustaining attention/completing tasks: yes Distracted by extraneous stimuli: no Does not listen when spoken to: no  Fidgets with hands or feet: no Unable to stay in seat: no Blurts out/interrupts others: no ADHD Medication Side Effects: no  Relevant past medical, surgical, family and social history reviewed and updated as indicated. Interim medical history since our last visit reviewed. Allergies and medications reviewed and updated.  Review of Systems  Constitutional: Negative.   Respiratory: Negative.   Cardiovascular: Negative.   Neurological: Positive for headaches. Negative for dizziness, tremors, seizures, syncope, facial asymmetry, speech difficulty, weakness, light-headedness and numbness.  Psychiatric/Behavioral: Positive for decreased concentration. Negative for agitation, behavioral problems, confusion, dysphoric mood, hallucinations, self-injury, sleep disturbance and suicidal ideas. The patient is not nervous/anxious and is not hyperactive.     Per HPI unless specifically indicated above     Objective:    BP 126/89 (BP Location: Left Arm, Patient Position: Sitting, Cuff Size: Normal)   Pulse 74   Temp 98.3 F (36.8 C)   Wt 199 lb 5 oz (90.4 kg)   SpO2 99%   BMI 28.60 kg/m   Wt Readings from Last 3 Encounters:  05/12/17 199 lb 5 oz (90.4 kg)  05/03/17 195 lb (88.5 kg)  04/28/17 198 lb 5 oz (90 kg)    Physical Exam  Constitutional: He  is oriented to person, place, and time. He appears well-developed and well-nourished. No distress.  HENT:  Head: Normocephalic and atraumatic.  Right Ear: Hearing normal.  Left Ear: Hearing normal.  Nose: Nose normal.  Eyes: Conjunctivae and lids are normal. Right eye exhibits no discharge. Left eye exhibits no discharge. No  scleral icterus.  Cardiovascular: Normal rate, regular rhythm, normal heart sounds and intact distal pulses.  Exam reveals no gallop and no friction rub.   No murmur heard. Pulmonary/Chest: Effort normal and breath sounds normal. No respiratory distress. He has no wheezes. He has no rales. He exhibits no tenderness.  Musculoskeletal: Normal range of motion.  Neurological: He is alert and oriented to person, place, and time.  Skin: Skin is warm, dry and intact. No rash noted. He is not diaphoretic. No erythema. No pallor.  Psychiatric: He has a normal mood and affect. His speech is normal and behavior is normal. Judgment and thought content normal. Cognition and memory are normal.  Nursing note and vitals reviewed.   Results for orders placed or performed in visit on 03/08/17  Compliance Drug Analysis, Ur  Result Value Ref Range   Summary FINAL       Assessment & Plan:   Problem List Items Addressed This Visit      Cardiovascular and Mediastinum   Migraine without aura and without status migrainosus, not intractable    Doing better on his propranolol. Headaches less intense. Rx for fioricet with codiene given. Will try to take sparingly- this is OK by pain management. Controlled substance agreement signed today.       Relevant Medications   butalbital-acetaminophen-caffeine (FIORICET WITH CODEINE) 50-325-40-30 MG capsule     Other   Adult ADHD - Primary    Noted reviewed from previous PCP. No evidence of any testing done to diagnose adult ADD. Will given him 1 month of medication and he will see neuropsych testing to get formal diagnosis- when that is done, will be happy to give him 3 month supply. Controlled substance agreement signed today.      Controlled substance agreement signed    For adderall and fioricet. We are aware of his chronic pain agreement, and are OK with those medicines coming from the pain clinic.           Follow up plan: Return 3-4 weeks, for follow up  migraines and ADD.

## 2017-05-12 NOTE — Assessment & Plan Note (Addendum)
Noted reviewed from previous PCP. No evidence of any testing done to diagnose adult ADD. Will given him 1 month of medication and he will see neuropsych testing to get formal diagnosis- when that is done, will be happy to give him 3 month supply. Controlled substance agreement signed today.

## 2017-05-30 ENCOUNTER — Encounter: Payer: Self-pay | Admitting: Anesthesiology

## 2017-05-30 ENCOUNTER — Ambulatory Visit (HOSPITAL_BASED_OUTPATIENT_CLINIC_OR_DEPARTMENT_OTHER): Payer: Managed Care, Other (non HMO) | Admitting: Anesthesiology

## 2017-05-30 ENCOUNTER — Other Ambulatory Visit: Payer: Self-pay | Admitting: Anesthesiology

## 2017-05-30 ENCOUNTER — Ambulatory Visit
Admission: RE | Admit: 2017-05-30 | Discharge: 2017-05-30 | Disposition: A | Discharge status: Home / Self Care | Payer: Managed Care, Other (non HMO) | Source: Ambulatory Visit | Attending: Anesthesiology | Admitting: Anesthesiology

## 2017-05-30 VITALS — BP 124/86 | HR 71 | Temp 98.9°F | Resp 18 | Ht 71.0 in | Wt 195.0 lb

## 2017-05-30 DIAGNOSIS — M542 Cervicalgia: Secondary | ICD-10-CM | POA: Insufficient documentation

## 2017-05-30 DIAGNOSIS — M545 Low back pain, unspecified: Secondary | ICD-10-CM

## 2017-05-30 DIAGNOSIS — M791 Myalgia: Secondary | ICD-10-CM | POA: Insufficient documentation

## 2017-05-30 DIAGNOSIS — R51 Headache: Secondary | ICD-10-CM | POA: Insufficient documentation

## 2017-05-30 DIAGNOSIS — Z820 Family history of epilepsy and other diseases of the nervous system: Secondary | ICD-10-CM | POA: Insufficient documentation

## 2017-05-30 DIAGNOSIS — M5136 Other intervertebral disc degeneration, lumbar region: Secondary | ICD-10-CM | POA: Diagnosis not present

## 2017-05-30 DIAGNOSIS — Z9889 Other specified postprocedural states: Secondary | ICD-10-CM | POA: Diagnosis not present

## 2017-05-30 DIAGNOSIS — G8929 Other chronic pain: Secondary | ICD-10-CM | POA: Diagnosis not present

## 2017-05-30 DIAGNOSIS — D509 Iron deficiency anemia, unspecified: Secondary | ICD-10-CM | POA: Insufficient documentation

## 2017-05-30 DIAGNOSIS — Z8379 Family history of other diseases of the digestive system: Secondary | ICD-10-CM | POA: Diagnosis not present

## 2017-05-30 DIAGNOSIS — N529 Male erectile dysfunction, unspecified: Secondary | ICD-10-CM | POA: Insufficient documentation

## 2017-05-30 DIAGNOSIS — M549 Dorsalgia, unspecified: Secondary | ICD-10-CM | POA: Diagnosis not present

## 2017-05-30 DIAGNOSIS — F909 Attention-deficit hyperactivity disorder, unspecified type: Secondary | ICD-10-CM | POA: Insufficient documentation

## 2017-05-30 DIAGNOSIS — G894 Chronic pain syndrome: Secondary | ICD-10-CM | POA: Insufficient documentation

## 2017-05-30 DIAGNOSIS — K259 Gastric ulcer, unspecified as acute or chronic, without hemorrhage or perforation: Secondary | ICD-10-CM | POA: Insufficient documentation

## 2017-05-30 DIAGNOSIS — Z79899 Other long term (current) drug therapy: Secondary | ICD-10-CM | POA: Insufficient documentation

## 2017-05-30 DIAGNOSIS — Z79891 Long term (current) use of opiate analgesic: Secondary | ICD-10-CM

## 2017-05-30 DIAGNOSIS — R52 Pain, unspecified: Secondary | ICD-10-CM

## 2017-05-30 DIAGNOSIS — K5090 Crohn's disease, unspecified, without complications: Secondary | ICD-10-CM | POA: Insufficient documentation

## 2017-05-30 DIAGNOSIS — M47816 Spondylosis without myelopathy or radiculopathy, lumbar region: Secondary | ICD-10-CM

## 2017-05-30 DIAGNOSIS — Z7982 Long term (current) use of aspirin: Secondary | ICD-10-CM | POA: Diagnosis not present

## 2017-05-30 DIAGNOSIS — M4696 Unspecified inflammatory spondylopathy, lumbar region: Secondary | ICD-10-CM

## 2017-05-30 MED ORDER — ROPIVACAINE HCL 2 MG/ML IJ SOLN
INTRAMUSCULAR | Status: AC
  Filled 2017-05-30: qty 10

## 2017-05-30 MED ORDER — ROPIVACAINE HCL 2 MG/ML IJ SOLN: 10.0000 mL | Freq: Once | INTRAMUSCULAR | Status: DC

## 2017-05-30 MED ORDER — DEXAMETHASONE SODIUM PHOSPHATE 10 MG/ML IJ SOLN: 10.0000 mg | Freq: Once | INTRAMUSCULAR | Status: DC

## 2017-05-30 MED ORDER — HYDROCODONE-ACETAMINOPHEN 7.5-325 MG PO TABS: 1.0000 | ORAL_TABLET | Freq: Four times a day (QID) | ORAL | 0 refills | Status: DC | PRN

## 2017-05-30 MED ORDER — DEXAMETHASONE SODIUM PHOSPHATE 10 MG/ML IJ SOLN
INTRAMUSCULAR | Status: AC
  Filled 2017-05-30: qty 1

## 2017-05-30 NOTE — Progress Notes (Signed)
Nursing Pain Medication Assessment:  Safety precautions to be maintained throughout the outpatient stay will include: orient to surroundings, keep bed in low position, maintain call bell within reach at all times, provide assistance with transfer out of bed and ambulation.  Medication Inspection Compliance: Pill count conducted under aseptic conditions, in front of the patient. Neither the pills nor the bottle was removed from the patient's sight at any time. Once count was completed pills were immediately returned to the patient in their original bottle.  Medication: Hydrocodone/APAP Pill/Patch Count: 7 of 90 pills remain Pill/Patch Appearance: Markings consistent with prescribed medication Bottle Appearance: Standard pharmacy container. Clearly labeled. Filled Date: 08 / 01 / 2018 Last Medication intake:  Today

## 2017-05-30 NOTE — Patient Instructions (Signed)
Trigger Point Injection Trigger points are areas where you have pain. A trigger point injection is a shot given in the trigger point to help relieve pain for a few days to a few months. Common places for trigger points include:  The neck.  The shoulders.  The upper back.  The lower back.  A trigger point injection will not cure long-lasting (chronic) pain permanently. These injections do not always work for every person, but for some people they can help to relieve pain for a few days to a few months. Tell a health care provider about:  Any allergies you have.  All medicines you are taking, including vitamins, herbs, eye drops, creams, and over-the-counter medicines.  Any problems you or family members have had with anesthetic medicines.  Any blood disorders you have.  Any surgeries you have had.  Any medical conditions you have. What are the risks? Generally, this is a safe procedure. However, problems may occur, including:  Infection.  Bleeding.  Allergic reaction to the injected medicine.  Irritation of the skin around the injection site.  What happens before the procedure?  Ask your health care provider about changing or stopping your regular medicines. This is especially important if you are taking diabetes medicines or blood thinners. What happens during the procedure?  Your health care provider will feel for trigger points. A marker may be used to circle the area for the injection.  The skin over the trigger point will be washed with a germ-killing (antiseptic) solution.  A thin needle is used for the shot. You may feel pain or a twitching feeling when the needle enters the trigger point.  A numbing solution may be injected into the trigger point. Sometimes a medicine to keep down swelling, redness, and warmth (inflammation) is also injected.  Your health care provider may move the needle around the area where the trigger point is located until the tightness  and twitching goes away.  After the injection, your health care provider may put gentle pressure over the injection site.  The injection site will be covered with a bandage (dressing). The procedure may vary among health care providers and hospitals. What happens after the procedure?  The dressing can be taken off in a few hours or as told by your health care provider.  You may feel sore and stiff for 1-2 days. This information is not intended to replace advice given to you by your health care provider. Make sure you discuss any questions you have with your health care provider. Document Released: 09/08/2011 Document Revised: 05/22/2016 Document Reviewed: 03/09/2015 Elsevier Interactive Patient Education  2018 [place]. Pain Management Discharge Instructions  General Discharge Instructions :  If you need to reach your doctor call: Monday-Friday 8:00 am - 4:00 pm at [phone] or toll free [phone].  After clinic hours [phone] to have operator reach doctor.  Bring all of your medication bottles to all your appointments in the pain clinic.  To cancel or reschedule your appointment with Pain Management please remember to call 24 hours in advance to avoid a fee.  Refer to the educational materials which you have been given on: General Risks, I had my Procedure. Discharge Instructions, Post Sedation.  Post Procedure Instructions:  The drugs you were given will stay in your system until tomorrow, so for the next 24 hours you should not drive, make any legal decisions or drink any alcoholic beverages.  You may eat anything you prefer, but it is better to start with  liquids then soups and crackers, and gradually work up to solid foods.  Please notify your doctor immediately if you have any unusual bleeding, trouble breathing or pain that is not related to your normal pain.  Depending on the type of procedure that was done, some parts of your body may feel week and/or numb.   This usually clears up by tonight or the next day.  Walk with the use of an assistive device or accompanied by an adult for the 24 hours.  You may use ice on the affected area for the first 24 hours.  Put ice in a Ziploc bag and cover with a towel and place against area 15 minutes on 15 minutes off.  You may switch to heat after 24 hours.

## 2017-05-30 NOTE — Progress Notes (Signed)
Subjective:  Patient ID: Stephen Wise, male    DOB: [birth year]  Age: 46 y.o. MRN: [id number]  CC: Back Pain (lower and equal bilaterally)     PROCEDURE:Left lumbar trigger point injection 2  HPI Stephen Wise presents for reevaluation. He continues to have pain same quality characteristic and distribution as previously documented. He's poorly complaining of low back pain primarily left lower lumbar region with spasming and occasional radiation into the left hip and buttock region. His been on medication management for this without significant improvement with conservative care. Stretching strengthening exercises have not alleviated the pain. He has been taking hydrocodone for sometime and tolerates that well. He keeps his pain under good control and based on his narcotic assessment sheet he is driving good lifestyle improvement in function with it with minimal side effect. It is enabling him to continue working. Otherwise this point he's in his usual state of health.  History Stephen Wise has a past medical history of Adult ADHD; Chronic headaches; Chronic pain; Chronic prostatitis; Condyloma acuminatum of penis; Crohn disease ([place]); ED (erectile dysfunction); Gastric ulcer; Inflammatory bowel disease; Microcytic anemia; and Peyronie's disease.   He has a past surgical history that includes Colon surgery (2000); Esophagogastroduodenoscopy (03/08/1999); and Colonoscopy (07/21/2015).   His family history includes Alzheimer's disease in his maternal grandmother; Cirrhosis in his father; Irritable bowel syndrome in his mother.He reports that he has never smoked. He has never used smokeless tobacco. He reports that he does not drink alcohol or use drugs.    No results found for: TOXASSSELUR  Outpatient Medications Prior to Visit  Medication Sig Dispense Refill  . amphetamine-dextroamphetamine (ADDERALL) 10 MG tablet Take 1 tablet (10 mg total) by mouth 2 (two) times daily. 60 tablet 0  .  Aspirin-Acetaminophen-Caffeine (EXCEDRIN MIGRAINE PO) Take by mouth.    . butalbital-acetaminophen-caffeine (FIORICET WITH CODEINE) 50-325-40-30 MG capsule Take 1 capsule by mouth every 4 (four) hours. 30 capsule 0  . mesalamine (PENTASA) 500 MG CR capsule Take 1,000 mg by mouth 3 (three) times daily as needed.     . propranolol ER (INDERAL LA) 60 MG 24 hr capsule Take 1 capsule (60 mg total) by mouth daily. 30 capsule 2  . HYDROcodone-acetaminophen (NORCO) 7.5-325 MG tablet Take 1 tablet by mouth every 6 (six) hours as needed for moderate pain or severe pain. 90 tablet 0   No facility-administered medications prior to visit.    No results found for: WBC, HGB, HCT, PLT, GLUCOSE, CHOL, TRIG, HDL, LDLDIRECT, LDLCALC, ALT, AST, NA, K, CL, CREATININE, BUN, CO2, TSH, PSA, INR, GLUF, HGBA1C, MICROALBUR  --------------------------------------------------------------------------------------------------------------------- Dg Cervical Spine Complete  Result Date: 03/11/2017 CLINICAL DATA:  Chronic cervicalgia with upper extremity tingling EXAM: CERVICAL SPINE - COMPLETE 4+ VIEW COMPARISON:  None. FINDINGS: Frontal, lateral, open-mouth odontoid, and bilateral oblique views were obtained. There is no fracture or spondylolisthesis. Prevertebral soft tissues and predental space regions are normal. The disc spaces appear unremarkable. There is no appreciable exit foraminal narrowing on the oblique views. Lung apices are clear. IMPRESSION: No fracture or spondylolisthesis.  No appreciable arthropathy. Electronically Signed   By: Lowella Grip III M.D.   On: 03/11/2017 10:56   Dg Lumbar Spine Complete W/bend  Result Date: 03/11/2017 CLINICAL DATA:  Chronic pain EXAM: LUMBAR SPINE - COMPLETE WITH BENDING VIEWS COMPARISON:  None. FINDINGS: Standing frontal, standing neutral lateral, standing flexion lateral, standing extension lateral, spot lumbosacral lateral, and bilateral oblique views were obtained. There are 5  non-rib-bearing lumbar type vertebral bodies. There  is mild lower lumbar dextroscoliosis. There is no fracture. There is no spondylolisthesis on neutral lateral imaging. There is no appreciable change in lateral alignment with flexion and extension. The disc spaces appear unremarkable. Facet regions appear unremarkable. IMPRESSION: No fracture or spondylolisthesis. No appreciable change in lateral alignment with flexion-extension. No appreciable arthropathy. Electronically Signed   By: Lowella Grip III M.D.   On: 03/11/2017 10:54   Dg Hand 2 View Right  Result Date: 03/11/2017 CLINICAL DATA:  Chronic pain and tingling EXAM: RIGHT HAND - 2 VIEW COMPARISON:  None. FINDINGS: Frontal and lateral views were obtained. Bony mineralization is normal. No fracture or dislocation. The joint spaces appear normal. No erosive change or periostitis. IMPRESSION: No appreciable arthropathy.  No fracture or dislocation. Electronically Signed   By: Lowella Grip III M.D.   On: 03/11/2017 10:56   Dg Hand 2 View Left  Result Date: 03/11/2017 CLINICAL DATA:  Chronic bilateral hand pain.  No reported injury. EXAM: LEFT HAND - 2 VIEW COMPARISON:  None. FINDINGS: No acute fracture or dislocation. Tiny well corticated 1 mm density adjacent to the volar plate in the middle phalanx of the left third finger. No suspicious focal osseous lesion. No appreciable arthropathy. No radiopaque foreign body. IMPRESSION: Tiny well corticated 1 mm density adjacent to the volar plate in the middle phalanx of the left third finger, which may represent the sequela of a remote avulsion injury. No acute osseous injury or appreciable arthropathy in the left hand. Electronically Signed   By: Ilona Sorrel M.D.   On: 03/11/2017 10:56       ---------------------------------------------------------------------------------------------------------------------- Past Medical History:  Diagnosis Date  . Adult ADHD   . Chronic headaches   .  Chronic pain   . Chronic prostatitis   . Condyloma acuminatum of penis   . Crohn disease ([place])    with terminal ileitis  . ED (erectile dysfunction)   . Gastric ulcer   . Inflammatory bowel disease   . Microcytic anemia   . Peyronie's disease     Past Surgical History:  Procedure Laterality Date  . COLON SURGERY  2000   removal of colon and small intestine due to Crohn's  . COLONOSCOPY  07/21/2015   also done in 10/10, 6/00  . ESOPHAGOGASTRODUODENOSCOPY  03/08/1999    Family History  Problem Relation Age of Onset  . Irritable bowel syndrome Mother   . Cirrhosis Father   . Alzheimer's disease Maternal Grandmother     Social History  Substance Use Topics  . Smoking status: Never Smoker  . Smokeless tobacco: Never Used  . Alcohol use No    ---------------------------------------------------------------------------------------------------------------------   BP 124/86 (BP Location: Left Arm, Patient Position: Sitting)   Pulse 71   Temp 98.9 F (37.2 C) (Oral)   Resp 18   Ht 5' 11"  (1.803 m)   Wt 195 lb (88.5 kg)   SpO2 99%   BMI 27.20 kg/m    BP Readings from Last 3 Encounters:  05/30/17 124/86  05/12/17 126/89  05/03/17 138/82     Wt Readings from Last 3 Encounters:  05/30/17 195 lb (88.5 kg)  05/12/17 199 lb 5 oz (90.4 kg)  05/03/17 195 lb (88.5 kg)     ----------------------------------------------------------------------------------------------------------------------  ROS Review of Systems  No complaints of constipation Cardiac: No angina or shortness of breath GI: No constipation CNS: No dizziness or sedation  Objective:  BP 124/86 (BP Location: Left Arm, Patient Position: Sitting)   Pulse 71   Temp  98.9 F (37.2 C) (Oral)   Resp 18   Ht 5' 11"  (1.803 m)   Wt 195 lb (88.5 kg)   SpO2 99%   BMI 27.20 kg/m   Physical Exam patient is alert oriented cooperative compliant Heart is regular rate and rhythm Lungs clear to  auscultation Left lumbar region reveals 2 trigger points proximal L5.  Assessment & Plan:   Stephen Wise was seen today for back pain.  Diagnoses and all orders for this visit:  Neck pain  Long term current use of opiate analgesic  Chronic pain syndrome -     TRIGGER POINT INJECTION  Chronic left-sided low back pain without sciatica -     dexamethasone (DECADRON) injection 10 mg; 1 mL (10 mg total) by Other route once. -     ropivacaine (PF) 2 mg/mL (0.2%) (NAROPIN) injection 10 mL; 10 mLs by Epidural route once. -     INJECT TRIGGER POINT, 1 OR 2  Facet arthritis of lumbar region ([place])  Chronic bilateral low back pain without sciatica -     TRIGGER POINT INJECTION  Other orders -     HYDROcodone-acetaminophen (NORCO) 7.5-325 MG tablet; Take 1 tablet by mouth every 6 (six) hours as needed for moderate pain or severe pain.     ----------------------------------------------------------------------------------------------------------------------  Problem List Items Addressed This Visit      Other   Chronic pain syndrome (Chronic)   Long term current use of opiate analgesic (Chronic)   Neck pain - Primary (Chronic)    Other Visit Diagnoses    Chronic left-sided low back pain without sciatica       Relevant Medications   HYDROcodone-acetaminophen (NORCO) 7.5-325 MG tablet   dexamethasone (DECADRON) injection 10 mg   ropivacaine (PF) 2 mg/mL (0.2%) (NAROPIN) injection 10 mL   Other Relevant Orders   INJECT TRIGGER POINT, 1 OR 2   Facet arthritis of lumbar region ([facility])       Relevant Medications   HYDROcodone-acetaminophen (NORCO) 7.5-325 MG tablet   dexamethasone (DECADRON) injection 10 mg   Chronic bilateral low back pain without sciatica       Relevant Medications   HYDROcodone-acetaminophen (NORCO) 7.5-325 MG tablet   dexamethasone (DECADRON) injection 10 mg       ----------------------------------------------------------------------------------------------------------------------  1. Neck pain Continue same 2. Long term current use of opiate analgesic We will refill his medications for hydrocodone 7.5 mg tablets to be taken 3 times a day when necessary. We will try to wean these down in intensity over the next several months. 3. Chronic pain syndrome As above  4. Pain in both hands As above   5. Disc disease, degenerative, lumbar or lumbosacral We will proceed with his first trigger point injection for his myofascial pain today with return to clinic in 1 month for reevaluation and possible repeat injection. Continue stretching strengthening exercises as reviewed today 6. Facet arthritis of lumbar region [facility]) Depending on his response to therapy today we may consider a lumbar facet diagnostic block at one of his upcoming visits.    ----------------------------------------------------------------------------------------------------------------------  I am having [name] maintain his mesalamine, Aspirin-Acetaminophen-Caffeine (EXCEDRIN MIGRAINE PO), propranolol ER, butalbital-acetaminophen-caffeine, amphetamine-dextroamphetamine, and HYDROcodone-acetaminophen.   Meds ordered this encounter  Medications  . HYDROcodone-acetaminophen (NORCO) 7.5-325 MG tablet    Sig: Take 1 tablet by mouth every 6 (six) hours as needed for moderate pain or severe pain.    Dispense:  90 tablet    Refill:  0    Do not fill until [id number]  .  dexamethasone (DECADRON) injection 10 mg  . ropivacaine (PF) 2 mg/mL (0.2%) (NAROPIN) injection 10 mL    Procedure: Trigger point injection to the left lumbar paraspinous muscle #1  Trigger point injection: The area overlying the aforementioned trigger points were prepped with alcohol. They were then injected with a 25-gauge needle with 4 cc of ropivacaine 0.2% and Decadron 4 mg at each site after negative aspiration  for heme. This was performed after informed consent was obtained and risks and benefits reviewed. She tolerated this procedure without difficulty and was convalesced and discharged to home in stable condition for follow-up as mentioned.  @James  Adams, MD@   Follow-up: Return for evaluation, procedure.    Molli Barrows, MD 4:37 PM  The [place] practitioner database for opioid medications on this patient has been reviewed by me and my staff   Greater than 50% of the total encounter time was spent in counseling and / or coordination of care.     This dictation was performed utilizing Systems analyst.  Please excuse any unintentional or mistaken typographical errors as a result.

## 2017-05-31 ENCOUNTER — Telehealth: Payer: Self-pay | Admitting: *Deleted

## 2017-05-31 NOTE — Telephone Encounter (Signed)
Attempted to call patient for post procedure follow-up. Message left.

## 2017-06-06 ENCOUNTER — Telehealth: Payer: Self-pay | Admitting: Family Medicine

## 2017-06-06 ENCOUNTER — Encounter: Payer: Self-pay | Admitting: Neurology

## 2017-06-06 ENCOUNTER — Encounter: Payer: Self-pay | Admitting: Family Medicine

## 2017-06-06 ENCOUNTER — Ambulatory Visit (INDEPENDENT_AMBULATORY_CARE_PROVIDER_SITE_OTHER): Payer: Managed Care, Other (non HMO) | Admitting: Family Medicine

## 2017-06-06 VITALS — BP 130/84 | HR 69 | Temp 98.4°F | Wt 203.4 lb

## 2017-06-06 DIAGNOSIS — G43009 Migraine without aura, not intractable, without status migrainosus: Secondary | ICD-10-CM

## 2017-06-06 MED ORDER — BUTALBITAL-APAP-CAFF-COD 50-325-40-30 MG PO CAPS: 1.0000 | ORAL_CAPSULE | ORAL | 0 refills | Status: DC

## 2017-06-06 NOTE — Progress Notes (Signed)
BP 130/84 (BP Location: Left Arm, Patient Position: Sitting, Cuff Size: Normal)   Pulse 69   Temp 98.4 F (36.9 C)   Wt 203 lb 7 oz (92.3 kg)   SpO2 95%   BMI 28.37 kg/m    Subjective:    Patient ID: Stephen Wise, male    DOB: [birth year], 46 y.o.   MRN: [id number]  HPI: Kemond Amorin is a 46 y.o. male  Chief Complaint  Patient presents with  . Headache   MIGRAINES- has had to take a lot of the fiorcet- more than 1/2 of his medicine. Has been having more stress recently Duration: 4 years Onset: sudden Severity: severe Quality: sharp Frequency: 1x a week Location: L temporal  Headache duration: 15 minutes to hours Radiation: into the back of his head Time of day headache occurs: at random Alleviating factors: fiorecet Aggravating factors: sunlight, reflections, problems with his vision, got worse with a change in his contacts  Headache status at time of visit: current headache Treatments attempted: imitrex, maxalt, fiorecet, fiorcet with codiene, topamax, amitriptyline    Aura: no Nausea:  yes Vomiting: no Photophobia:  yes Phonophobia:  no Effect on social functioning:  yes Numbers of missed days of school/work each month: gone in late at least 3-4x a month Confusion:  yes Gait disturbance/ataxia:  no Behavioral changes:  yes Fevers:  no   Relevant past medical, surgical, family and social history reviewed and updated as indicated. Interim medical history since our last visit reviewed. Allergies and medications reviewed and updated.  Review of Systems  Constitutional: Negative.   Respiratory: Negative.   Cardiovascular: Negative.   Musculoskeletal: Positive for back pain, myalgias, neck pain and neck stiffness. Negative for arthralgias, gait problem and joint swelling.  Neurological: Positive for headaches. Negative for dizziness, tremors, seizures, syncope, facial asymmetry, speech difficulty, weakness, light-headedness and numbness.  Psychiatric/Behavioral:  Negative.     Per HPI unless specifically indicated above     Objective:    BP 130/84 (BP Location: Left Arm, Patient Position: Sitting, Cuff Size: Normal)   Pulse 69   Temp 98.4 F (36.9 C)   Wt 203 lb 7 oz (92.3 kg)   SpO2 95%   BMI 28.37 kg/m   Wt Readings from Last 3 Encounters:  06/06/17 203 lb 7 oz (92.3 kg)  05/30/17 195 lb (88.5 kg)  05/12/17 199 lb 5 oz (90.4 kg)    Physical Exam  Constitutional: He is oriented to person, place, and time. He appears well-developed and well-nourished. No distress.  HENT:  Head: Normocephalic and atraumatic.  Right Ear: Hearing normal.  Left Ear: Hearing normal.  Nose: Nose normal.  Eyes: Conjunctivae and lids are normal. Right eye exhibits no discharge. Left eye exhibits no discharge. No scleral icterus.  Cardiovascular: Normal rate, regular rhythm, normal heart sounds and intact distal pulses.  Exam reveals no gallop and no friction rub.   No murmur heard. Pulmonary/Chest: Effort normal and breath sounds normal. No respiratory distress. He has no wheezes. He has no rales. He exhibits no tenderness.  Musculoskeletal: Normal range of motion.  Neurological: He is alert and oriented to person, place, and time.  Skin: Skin is warm, dry and intact. No rash noted. He is not diaphoretic. No erythema. No pallor.  Psychiatric: He has a normal mood and affect. His speech is normal and behavior is normal. Judgment and thought content normal. Cognition and memory are normal.    Results for orders placed or performed in visit on  03/08/17  Compliance Drug Analysis, Ur  Result Value Ref Range   Summary FINAL       Assessment & Plan:   Problem List Items Addressed This Visit      Cardiovascular and Mediastinum   Migraine without aura and without status migrainosus, not intractable - Primary    Still acting up. No better on the propranolol. Has not gotten MRI due to insurance issues- looking into that today. Referral to neurology pending.  Refill of Fioricet given today. Recheck 1 month.       Relevant Medications   butalbital-acetaminophen-caffeine (FIORICET WITH CODEINE) 50-325-40-30 MG capsule   Other Relevant Orders   Ambulatory referral to Neurology       Follow up plan: Return in about 4 weeks (around 07/04/2017).

## 2017-06-06 NOTE — Telephone Encounter (Signed)
Contacted [employer] to obtain reason for denial of MRI. Case# [id number] Denial notification will be sent via fax.  I also scheduled a peer-to-peer to appeal denial. Dr. Wynetta Emery will receive a call from Dr. Janit Pagan with [facility] tomorrow at 12pm.   Note: Dr. Oval Linsey will call Dr. Durenda Age cell phone.

## 2017-06-06 NOTE — Assessment & Plan Note (Signed)
Still acting up. No better on the propranolol. Has not gotten MRI due to insurance issues- looking into that today. Referral to neurology pending. Refill of Fioricet given today. Recheck 1 month.

## 2017-06-07 NOTE — Telephone Encounter (Signed)
Spoke with patient & informed that MRI is scheduled on 06/15/17 @ 3:00pm   [facility] [address] [place] [address]

## 2017-06-07 NOTE — Telephone Encounter (Signed)
Called by Dr. Oval Linsey to do peer-to-peer on Stephen Wise's MRI. Approved.   Authorization number: [id number]  Can we please move forward on getting this scheduled?  Thanks!

## 2017-06-12 ENCOUNTER — Telehealth: Payer: Self-pay | Admitting: Family Medicine

## 2017-06-12 MED ORDER — AMPHETAMINE-DEXTROAMPHETAMINE 10 MG PO TABS: 10.0000 mg | ORAL_TABLET | Freq: Two times a day (BID) | ORAL | 0 refills | Status: DC

## 2017-06-12 NOTE — Telephone Encounter (Signed)
Routing to provider  

## 2017-06-12 NOTE — Telephone Encounter (Signed)
OK to call in. Thanks! 

## 2017-06-12 NOTE — Telephone Encounter (Signed)
Patient's wife would like ton know if patient could get a refill on education for Adderal. Patient sees new psychiatrist on September 28th.  Please Advise.  Thank you

## 2017-06-13 MED ORDER — AMPHETAMINE-DEXTROAMPHETAMINE 10 MG PO TABS: 10.0000 mg | ORAL_TABLET | Freq: Two times a day (BID) | ORAL | 0 refills | Status: DC

## 2017-06-13 NOTE — Telephone Encounter (Signed)
Was this prescription printed, patients wife is going to pick it up.

## 2017-06-13 NOTE — Telephone Encounter (Signed)
Patient uses [facility] but if this cannot be sent to the pharmacy patients wife would like to know so she can run by the office to pick it up.   Thanks

## 2017-06-15 ENCOUNTER — Ambulatory Visit: Payer: Managed Care, Other (non HMO)

## 2017-06-27 ENCOUNTER — Ambulatory Visit: Admission: RE | Admit: 2017-06-27 | Payer: Managed Care, Other (non HMO) | Source: Ambulatory Visit

## 2017-06-30 ENCOUNTER — Ambulatory Visit
Admission: RE | Admit: 2017-06-30 | Discharge: 2017-06-30 | Disposition: A | Discharge status: Home / Self Care | Payer: Managed Care, Other (non HMO) | Source: Ambulatory Visit | Attending: Family Medicine | Admitting: Family Medicine

## 2017-06-30 ENCOUNTER — Telehealth: Payer: Self-pay | Admitting: Family Medicine

## 2017-06-30 DIAGNOSIS — G43009 Migraine without aura, not intractable, without status migrainosus: Secondary | ICD-10-CM | POA: Diagnosis not present

## 2017-06-30 NOTE — Telephone Encounter (Signed)
Patient notified

## 2017-06-30 NOTE — Telephone Encounter (Signed)
Please let him know that his MRI came back normal! Thanks!

## 2017-07-04 ENCOUNTER — Ambulatory Visit (INDEPENDENT_AMBULATORY_CARE_PROVIDER_SITE_OTHER): Payer: Managed Care, Other (non HMO) | Admitting: Family Medicine

## 2017-07-04 ENCOUNTER — Encounter: Payer: Self-pay | Admitting: Family Medicine

## 2017-07-04 VITALS — BP 131/85 | HR 72 | Temp 98.5°F | Wt 200.2 lb

## 2017-07-04 DIAGNOSIS — F909 Attention-deficit hyperactivity disorder, unspecified type: Secondary | ICD-10-CM | POA: Diagnosis not present

## 2017-07-04 DIAGNOSIS — G43009 Migraine without aura, not intractable, without status migrainosus: Secondary | ICD-10-CM | POA: Diagnosis not present

## 2017-07-04 MED ORDER — BUTALBITAL-APAP-CAFF-COD 50-325-40-30 MG PO CAPS: 1.0000 | ORAL_CAPSULE | ORAL | 0 refills | Status: DC

## 2017-07-04 MED ORDER — PROPRANOLOL HCL ER 80 MG PO CP24: 80.0000 mg | ORAL_CAPSULE | Freq: Every day | ORAL | 3 refills | Status: DC

## 2017-07-04 MED ORDER — AMPHETAMINE-DEXTROAMPHETAMINE 10 MG PO TABS: 10.0000 mg | ORAL_TABLET | Freq: Two times a day (BID) | ORAL | 0 refills | Status: DC

## 2017-07-04 NOTE — Progress Notes (Signed)
BP 131/85 (BP Location: Left Arm, Patient Position: Sitting, Cuff Size: Large)   Pulse 72   Temp 98.5 F (36.9 C)   Wt 200 lb 3 oz (90.8 kg)   SpO2 100%   BMI 27.92 kg/m    Subjective:    Patient ID: Stephen Wise, male    DOB: 1971-03-16, 46 y.o.   MRN: [id number]  HPI: Stephen Wise is a 46 y.o. male  Chief Complaint  Patient presents with  . Migraine   MIGRAINES- feels like it is more tolerable, able to deal with them more.  Duration: 4 years Onset: sudden Severity: severe Quality: sharp Frequency: 1x a week Location: L temporal  Headache duration:15 minutes to hours Radiation: into the back of his head Time of day headache occurs: at random Alleviating factors: fiorecet Aggravating factors:sunlight, reflections, problems with his vision, got worse with a change in his contacts Headache status at time of visit: asymptomatic Treatments attempted: imitrex, maxalt, fiorecet, fiorcet with codiene, topamax, amitriptyline Aura: no Nausea: yes Vomiting: no Photophobia: yes Phonophobia: no Effect on social functioning: yes Numbers of missed days of school/work each month: gone in late at least 3-4x a month Confusion: yes Gait disturbance/ataxia: no Behavioral changes: yes Fevers: no  ADHD FOLLOW UP- Going for neuropsych testing on Monday ADHD status: stable Satisfied with current therapy: yes Medication compliance:  excellent compliance Controlled substance contract: yes Previous psychiatry evaluation: no Previous medications: yes    Taking meds on weekends/vacations: yes Work/school performance:  good Difficulty sustaining attention/completing tasks: no Distracted by extraneous stimuli: no Does not listen when spoken to: no  Fidgets with hands or feet: no Unable to stay in seat: no Blurts out/interrupts others: no ADHD Medication Side Effects: no    Decreased appetite: no    Headache: no    Sleeping disturbance pattern: no    Irritability: no   Rebound effects (worse than baseline) off medication: no    Anxiousness: no    Dizziness: no    Tics: no  Relevant past medical, surgical, family and social history reviewed and updated as indicated. Interim medical history since our last visit reviewed. Allergies and medications reviewed and updated.  Review of Systems  Constitutional: Negative.   Respiratory: Negative.   Cardiovascular: Negative.   Neurological: Positive for headaches. Negative for dizziness, tremors, seizures, syncope, facial asymmetry, speech difficulty, weakness, light-headedness and numbness.  Psychiatric/Behavioral: Negative.     Per HPI unless specifically indicated above     Objective:    BP 131/85 (BP Location: Left Arm, Patient Position: Sitting, Cuff Size: Large)   Pulse 72   Temp 98.5 F (36.9 C)   Wt 200 lb 3 oz (90.8 kg)   SpO2 100%   BMI 27.92 kg/m   Wt Readings from Last 3 Encounters:  07/04/17 200 lb 3 oz (90.8 kg)  06/06/17 203 lb 7 oz (92.3 kg)  05/30/17 195 lb (88.5 kg)    Physical Exam  Constitutional: He is oriented to person, place, and time. He appears well-developed and well-nourished. No distress.  HENT:  Head: Normocephalic and atraumatic.  Right Ear: Hearing normal.  Left Ear: Hearing normal.  Nose: Nose normal.  Eyes: Conjunctivae and lids are normal. Right eye exhibits no discharge. Left eye exhibits no discharge. No scleral icterus.  Cardiovascular: Normal rate, regular rhythm, normal heart sounds and intact distal pulses.  Exam reveals no gallop and no friction rub.   No murmur heard. Pulmonary/Chest: Effort normal and breath sounds normal. No respiratory distress.  He has no wheezes. He has no rales. He exhibits no tenderness.  Musculoskeletal: Normal range of motion.  Neurological: He is alert and oriented to person, place, and time.  Skin: Skin is warm, dry and intact. No rash noted. He is not diaphoretic. No erythema. No pallor.  Psychiatric: He has a normal mood  and affect. His speech is normal and behavior is normal. Judgment and thought content normal. Cognition and memory are normal.  Nursing note and vitals reviewed.   Results for orders placed or performed in visit on 03/08/17  Compliance Drug Analysis, Ur  Result Value Ref Range   Summary FINAL       Assessment & Plan:   Problem List Items Addressed This Visit      Cardiovascular and Mediastinum   Migraine without aura and without status migrainosus, not intractable - Primary    MRI normal. Seeing neurology in December. Will increase his propranolol to 47m daily and recheck 1 month. Refill of Fioricet given today.       Relevant Medications   propranolol ER (INDERAL LA) 80 MG 24 hr capsule   butalbital-acetaminophen-caffeine (FIORICET WITH CODEINE) 50-325-40-30 MG capsule     Other   Adult ADHD    Seeing neuropsych on Monday for evaluation. Will given him 1 month of medication and he will see neuropsych testing to get formal diagnosis- when that is done, will be happy to give him 3 month supply. Controlled substance agreement signed today.          Follow up plan: Return in about 4 weeks (around 08/01/2017) for Follow up migraines and ADD.

## 2017-07-04 NOTE — Assessment & Plan Note (Signed)
Seeing neuropsych on Monday for evaluation. Will given him 1 month of medication and he will see neuropsych testing to get formal diagnosis- when that is done, will be happy to give him 3 month supply. Controlled substance agreement signed today.

## 2017-07-04 NOTE — Assessment & Plan Note (Signed)
MRI normal. Seeing neurology in December. Will increase his propranolol to 24m daily and recheck 1 month. Refill of Fioricet given today.

## 2017-07-10 ENCOUNTER — Telehealth: Payer: Self-pay | Admitting: Anesthesiology

## 2017-07-11 NOTE — Telephone Encounter (Signed)
Patient needs meds refill and procedure appt date. Done kw

## 2017-07-12 ENCOUNTER — Ambulatory Visit: Payer: Managed Care, Other (non HMO) | Attending: Anesthesiology | Admitting: Anesthesiology

## 2017-07-12 ENCOUNTER — Encounter: Payer: Self-pay | Admitting: Anesthesiology

## 2017-07-12 VITALS — BP 129/91 | HR 70 | Temp 98.4°F | Resp 16 | Ht 70.5 in | Wt 198.0 lb

## 2017-07-12 DIAGNOSIS — Z79891 Long term (current) use of opiate analgesic: Secondary | ICD-10-CM | POA: Insufficient documentation

## 2017-07-12 DIAGNOSIS — M4696 Unspecified inflammatory spondylopathy, lumbar region: Secondary | ICD-10-CM

## 2017-07-12 DIAGNOSIS — M542 Cervicalgia: Secondary | ICD-10-CM | POA: Insufficient documentation

## 2017-07-12 DIAGNOSIS — M545 Low back pain, unspecified: Secondary | ICD-10-CM

## 2017-07-12 DIAGNOSIS — G894 Chronic pain syndrome: Secondary | ICD-10-CM | POA: Insufficient documentation

## 2017-07-12 DIAGNOSIS — G8929 Other chronic pain: Secondary | ICD-10-CM

## 2017-07-12 DIAGNOSIS — M47816 Spondylosis without myelopathy or radiculopathy, lumbar region: Secondary | ICD-10-CM

## 2017-07-12 MED ORDER — ROPIVACAINE HCL 2 MG/ML IJ SOLN
10.0000 mL | Freq: Once | INTRAMUSCULAR | Status: AC
  Administered 2017-07-12: 9 mL via EPIDURAL

## 2017-07-12 MED ORDER — ROPIVACAINE HCL 2 MG/ML IJ SOLN
INTRAMUSCULAR | Status: AC
  Filled 2017-07-12: qty 10

## 2017-07-12 MED ORDER — DEXAMETHASONE SODIUM PHOSPHATE 10 MG/ML IJ SOLN
10.0000 mg | Freq: Once | INTRAMUSCULAR | Status: AC
  Administered 2017-07-12: 10 mg

## 2017-07-12 MED ORDER — HYDROCODONE-ACETAMINOPHEN 7.5-325 MG PO TABS: 1.0000 | ORAL_TABLET | Freq: Four times a day (QID) | ORAL | 0 refills | Status: DC | PRN

## 2017-07-12 MED ORDER — DEXAMETHASONE SODIUM PHOSPHATE 10 MG/ML IJ SOLN
INTRAMUSCULAR | Status: AC
Start: 2017-07-12 — End: ?
  Filled 2017-07-12: qty 1

## 2017-07-12 NOTE — Progress Notes (Signed)
Subjective:  Patient ID: Stephen Wise, male    DOB: [birth year]  Age: 46 y.o. MRN: [id number]  CC: Back Pain (lower and worse on left)   Procedure: Left lumbar trigger point injection 2   HPI Stephen Wise presents for reevaluation. He was last seen about a month and a half ago and had his first left lumbar trigger point injection. Stephen Wise reports that he had 80% improvement lasting about 4 weeks. He has had some gradual recurrence of a tightening left lumbar pain. This is consistent with what he has a previously experienced. No changes in lower extremity strength or function are noted at this time. He has been taking his hydrocodone up to 3 times a day as previously and based on his narcotic assessment sheet he continues to derive good lifestyle functional improvement with his medicine. Otherwise he is in his usual state of health this time. No change in bowel bladder function or lower external strength or function are noted. He is doing his physical therapy exercises as previously reviewed.  Outpatient Medications Prior to Visit  Medication Sig Dispense Refill  . amphetamine-dextroamphetamine (ADDERALL) 10 MG tablet Take 1 tablet (10 mg total) by mouth 2 (two) times daily. 60 tablet 0  . Aspirin-Acetaminophen-Caffeine (EXCEDRIN MIGRAINE PO) Take by mouth.    . butalbital-acetaminophen-caffeine (FIORICET WITH CODEINE) 50-325-40-30 MG capsule Take 1 capsule by mouth every 4 (four) hours. 60 capsule 0  . mesalamine (PENTASA) 500 MG CR capsule Take 1,000 mg by mouth 3 (three) times daily as needed.     . propranolol ER (INDERAL LA) 80 MG 24 hr capsule Take 1 capsule (80 mg total) by mouth daily. 30 capsule 3  . HYDROcodone-acetaminophen (NORCO) 7.5-325 MG tablet Take 1 tablet by mouth every 6 (six) hours as needed for moderate pain or severe pain. 90 tablet 0   No facility-administered medications prior to visit.     Review of Systems CNS: No confusion or sedation Constitutional no fevers and  chills GI: No constipation or abdominal pain  Objective:  BP (!) 129/91   Pulse 70   Temp 98.4 F (36.9 C) (Oral)   Resp 16   Ht 5' 10.5" (1.791 m)   Wt 198 lb (89.8 kg)   SpO2 98%   BMI 28.01 kg/m    BP Readings from Last 3 Encounters:  07/12/17 (!) 129/91  07/04/17 131/85  06/06/17 130/84     Wt Readings from Last 3 Encounters:  07/12/17 198 lb (89.8 kg)  07/04/17 200 lb 3 oz (90.8 kg)  06/06/17 203 lb 7 oz (92.3 kg)     Physical Exam Pt is alert and oriented PERRL EOMI HEART IS RRR no murmur or rub LCTA no wheezing or rhales MUSCULOSKELETALReveals some persistent left-sided paraspinous muscle tenderness. He has 2 trigger points overlying the left posterior superior iliac crest. His lower extremity muscle tone and bulk is good.  Labs  No results found for: HGBA1C No results found for: GLUF, MICROALBUR, LDLCALC, CREATININE  -------------------------------------------------------------------------------------------------------------------- No results found for: WBC, HGB, HCT, PLT, GLUCOSE, CHOL, TRIG, HDL, LDLDIRECT, LDLCALC, ALT, AST, NA, K, CL, CREATININE, BUN, CO2, TSH, PSA, INR, GLUF, HGBA1C, MICROALBUR  --------------------------------------------------------------------------------------------------------------------- Mr Brain Wo Contrast  Result Date: 06/30/2017 CLINICAL DATA:  Chronic migraines, worsening in intensity. EXAM: MRI HEAD WITHOUT CONTRAST TECHNIQUE: Multiplanar, multiecho pulse sequences of the brain and surrounding structures were obtained without intravenous contrast. COMPARISON:  None. FINDINGS: Brain: No infarction, hemorrhage, hydrocephalus, extra-axial collection or mass lesion. Single FLAIR hyperintense focus in  the left cerebral white matter, allowable for age and nonspecific. Vascular: Major vessels are patent Skull and upper cervical spine: No evidence of bone lesion. Sinuses/Orbits: No sinusitis or orbital finding to explain headache.  IMPRESSION: Negative brain MRI.  No explanation for headache. Electronically Signed   By: Monte Fantasia M.D.   On: 06/30/2017 09:02     Assessment & Plan:   Stephen Wise was seen today for back pain.  Diagnoses and all orders for this visit:  Neck pain  Long term current use of opiate analgesic  Chronic pain syndrome  Chronic left-sided low back pain without sciatica  Facet arthritis of lumbar region [facility])  Chronic bilateral low back pain without sciatica -     dexamethasone (DECADRON) injection 10 mg; 1 mL (10 mg total) by Other route once. -     ropivacaine (PF) 2 mg/mL (0.2%) (NAROPIN) injection 10 mL; 10 mLs by Epidural route once. -     TRIGGER POINT INJECTION; Future  Other orders -     Discontinue: HYDROcodone-acetaminophen (NORCO) 7.5-325 MG tablet; Take 1 tablet by mouth every 6 (six) hours as needed for moderate pain or severe pain. -     HYDROcodone-acetaminophen (NORCO) 7.5-325 MG tablet; Take 1 tablet by mouth every 6 (six) hours as needed for moderate pain or severe pain.        ----------------------------------------------------------------------------------------------------------------------  Problem List Items Addressed This Visit      Other   Chronic pain syndrome (Chronic)   Long term current use of opiate analgesic (Chronic)   Neck pain - Primary (Chronic)    Other Visit Diagnoses    Chronic left-sided low back pain without sciatica       Relevant Medications   dexamethasone (DECADRON) injection 10 mg   HYDROcodone-acetaminophen (NORCO) 7.5-325 MG tablet   Facet arthritis of lumbar region (HCC)       Relevant Medications   dexamethasone (DECADRON) injection 10 mg   HYDROcodone-acetaminophen (NORCO) 7.5-325 MG tablet   Chronic bilateral low back pain without sciatica       Relevant Medications   dexamethasone (DECADRON) injection 10 mg   ropivacaine (PF) 2 mg/mL (0.2%) (NAROPIN) injection 10 mL   HYDROcodone-acetaminophen (NORCO) 7.5-325 MG  tablet   Other Relevant Orders   TRIGGER POINT INJECTION        ----------------------------------------------------------------------------------------------------------------------  1. Neck pain Discussed continuation of certain exercises for upper extremity strength and avoidance of overhead activities of May exacerbate his neck pain.  2. Long term current use of opiate analgesic We will refill his hydrocodone for 90 tablets this month with discussion to reduce his usage to every 2-3 days and 75 for November 9. We've reviewed to [facility] practitioner database information and it is appropriate.  3. Chronic pain syndrome As above  4. Chronic left-sided low back pain without sciatica We'll proceed with a second trigger point injection today with return to clinic in 1 month for reevaluation and probable repeat injection.  5. Facet arthritis of lumbar region [facility]) Maybe a candidate for a eventual diagnostic facet block  6. Chronic bilateral low back pain without sciatica As above - dexamethasone (DECADRON) injection 10 mg; 1 mL (10 mg total) by Other route once. - ropivacaine (PF) 2 mg/mL (0.2%) (NAROPIN) injection 10 mL; 10 mLs by Epidural route once. - TRIGGER POINT INJECTION; Future    ----------------------------------------------------------------------------------------------------------------------  I am having Stephen Wise maintain his mesalamine, Aspirin-Acetaminophen-Caffeine (EXCEDRIN MIGRAINE PO), propranolol ER, butalbital-acetaminophen-caffeine, amphetamine-dextroamphetamine, and HYDROcodone-acetaminophen.   Meds ordered this encounter  Medications  . dexamethasone (DECADRON) injection 10 mg  . ropivacaine (PF) 2 mg/mL (0.2%) (NAROPIN) injection 10 mL  . DISCONTD: HYDROcodone-acetaminophen (NORCO) 7.5-325 MG tablet    Sig: Take 1 tablet by mouth every 6 (six) hours as needed for moderate pain or severe pain.    Dispense:  90 tablet    Refill:  0  .  HYDROcodone-acetaminophen (NORCO) 7.5-325 MG tablet    Sig: Take 1 tablet by mouth every 6 (six) hours as needed for moderate pain or severe pain.    Dispense:  75 tablet    Refill:  0    Do not fill until [id number]   Patient's Medications  New Prescriptions   No medications on file  Previous Medications   AMPHETAMINE-DEXTROAMPHETAMINE (ADDERALL) 10 MG TABLET    Take 1 tablet (10 mg total) by mouth 2 (two) times daily.   ASPIRIN-ACETAMINOPHEN-CAFFEINE (EXCEDRIN MIGRAINE PO)    Take by mouth.   BUTALBITAL-ACETAMINOPHEN-CAFFEINE (FIORICET WITH CODEINE) 50-325-40-30 MG CAPSULE    Take 1 capsule by mouth every 4 (four) hours.   MESALAMINE (PENTASA) 500 MG CR CAPSULE    Take 1,000 mg by mouth 3 (three) times daily as needed.    PROPRANOLOL ER (INDERAL LA) 80 MG 24 HR CAPSULE    Take 1 capsule (80 mg total) by mouth daily.  Modified Medications   Modified Medication Previous Medication   HYDROCODONE-ACETAMINOPHEN (NORCO) 7.5-325 MG TABLET HYDROcodone-acetaminophen (NORCO) 7.5-325 MG tablet      Take 1 tablet by mouth every 6 (six) hours as needed for moderate pain or severe pain.    Take 1 tablet by mouth every 6 (six) hours as needed for moderate pain or severe pain.  Discontinued Medications   No medications on file   ---------------------------------------------------------------------------------------------------------------------- Procedure: Left lumbar trigger point injection 2 #2 Trigger point injection: The area overlying the aforementioned trigger points were prepped with alcohol. They were then injected with a 25-gauge needle with 4 cc of ropivacaine 0.2% and Decadron 4 mg at each site after negative aspiration for heme. This was performed after informed consent was obtained and risks and benefits reviewed. She tolerated this procedure without difficulty and was convalesced and discharged to home in stable condition for follow-up as mentioned.  @Arlen Legendre  Andree Elk, MD@  Follow-up: Return  for evaluation, procedure.    Molli Barrows, MD

## 2017-07-12 NOTE — Progress Notes (Signed)
Nursing Pain Medication Assessment:  Safety precautions to be maintained throughout the outpatient stay will include: orient to surroundings, keep bed in low position, maintain call bell within reach at all times, provide assistance with transfer out of bed and ambulation.  Medication Inspection Compliance: Pill count conducted under aseptic conditions, in front of the patient. Neither the pills nor the bottle was removed from the patient's sight at any time. Once count was completed pills were immediately returned to the patient in their original bottle.  Medication: Hydrocodone/APAP Pill/Patch Count: 0 of 90 pills remain Pill/Patch Appearance: Markings consistent with prescribed medication Bottle Appearance: Standard pharmacy container. Clearly labeled. Filled Date: 08 / 31 / 2018 Last Medication intake:  Yesterday

## 2017-07-13 ENCOUNTER — Telehealth: Payer: Self-pay | Admitting: *Deleted

## 2017-07-13 NOTE — Telephone Encounter (Signed)
Left voicemail for patient to return call for any post procedure issues.

## 2017-07-21 ENCOUNTER — Telehealth: Payer: Self-pay | Admitting: Family Medicine

## 2017-07-21 NOTE — Telephone Encounter (Signed)
Patients wife called to see if Dr Wynetta Emery would give Alphonza a 10-day supply of the butalbital-acetaminophen-caffeine today as they are leaving to go out of town until next Friday  Dottie [phone]

## 2017-07-21 NOTE — Telephone Encounter (Signed)
Spoke with patient, he states that he has had to take some extra because he has had such horrible headaches. He has an appointment to come in on the 30th.

## 2017-07-21 NOTE — Telephone Encounter (Signed)
Unfortunately, he cannot have a refill on that. He used 60 tabs in 17 days, that's too much medicine.

## 2017-07-21 NOTE — Telephone Encounter (Signed)
Patient notified

## 2017-08-01 ENCOUNTER — Ambulatory Visit (INDEPENDENT_AMBULATORY_CARE_PROVIDER_SITE_OTHER): Payer: Managed Care, Other (non HMO) | Admitting: Family Medicine

## 2017-08-01 ENCOUNTER — Encounter: Payer: Self-pay | Admitting: Family Medicine

## 2017-08-01 VITALS — BP 128/79 | HR 73 | Wt 201.4 lb

## 2017-08-01 DIAGNOSIS — F909 Attention-deficit hyperactivity disorder, unspecified type: Secondary | ICD-10-CM | POA: Diagnosis not present

## 2017-08-01 DIAGNOSIS — G43009 Migraine without aura, not intractable, without status migrainosus: Secondary | ICD-10-CM | POA: Diagnosis not present

## 2017-08-01 MED ORDER — BUTALBITAL-APAP-CAFF-COD 50-325-40-30 MG PO CAPS: 1.0000 | ORAL_CAPSULE | ORAL | 0 refills | Status: DC

## 2017-08-01 MED ORDER — AMPHETAMINE-DEXTROAMPHETAMINE 10 MG PO TABS: 10.0000 mg | ORAL_TABLET | Freq: Two times a day (BID) | ORAL | 0 refills | Status: DC

## 2017-08-01 MED ORDER — SUMATRIPTAN SUCCINATE 100 MG PO TABS: 100.0000 mg | ORAL_TABLET | ORAL | 12 refills | Status: DC | PRN

## 2017-08-01 NOTE — Assessment & Plan Note (Signed)
Had a bad couple of weeks. Advised him against taking so much fioricet as it can cause rebound headaches. Will get him Rx for imitrex if he has bad time like that again. It didn't particularly help him in the past, but at least then he has something. Advised him that he can call here and we can get him a toradol shot if that bad in the future. Seeing Dr. Tomi Likens in December.

## 2017-08-01 NOTE — Progress Notes (Signed)
BP 128/79 (BP Location: Left Arm, Patient Position: Sitting, Cuff Size: Large)   Pulse 73   Wt 201 lb 7 oz (91.4 kg)   SpO2 100%   BMI 28.49 kg/m    Subjective:    Patient ID: Stephen Wise, male    DOB: 04-21-1971, 46 y.o.   MRN: [id number]  HPI: Stephen Wise is a 46 y.o. male  Chief Complaint  Patient presents with  . Headache  . ADD   MIGRAINES- has an appointment to see neurology regarding his migraines 09/13/17. Had a terrible migraine in the last month that he couldn't shake. Took 60 pills of his fioricet in 17 days. He's not sure what's been going on but probably the pressure, but had a lot of severe headaches over the past couple of days Duration: 4 years Onset: sudden Severity: severe Quality: sharp Frequency: 1x a week Location: L temporal  Headache duration:15 minutes to hours Radiation: into the back of his head Time of day headache occurs: at random Alleviating factors: fiorecet Aggravating factors:sunlight, reflections, problems with his vision, got worse with a change in his contacts Headache status at time of visit: asymptomatic Treatments attempted: imitrex, maxalt, fiorecet, fiorcet with codiene, topamax, amitriptyline, propranolol Aura: no Nausea: yes Vomiting: no Photophobia: yes Phonophobia: no Effect on social functioning: yes Numbers of missed days of school/work each month: gone in late at least 3-4x a month Confusion: yes Gait disturbance/ataxia: no Behavioral changes: yes Fevers: no  ADHD FOLLOW UP- had neuropsych testing done on 07/10/17 ADHD status: stable Satisfied with current therapy: no Medication compliance:  excellent compliance Controlled substance contract: yes Previous psychiatry evaluation: yes Previous medications: yes adderall and adderall XR   Taking meds on weekends/vacations: yes Work/school performance:  good Difficulty sustaining attention/completing tasks: yes Distracted by extraneous stimuli: no Does not  listen when spoken to: no  Fidgets with hands or feet: no Unable to stay in seat: no Blurts out/interrupts others: no ADHD Medication Side Effects: no    Decreased appetite: no    Headache: no    Sleeping disturbance pattern: no    Irritability: no    Rebound effects (worse than baseline) off medication: no    Anxiousness: no    Dizziness: no    Tics: no  Relevant past medical, surgical, family and social history reviewed and updated as indicated. Interim medical history since our last visit reviewed. Allergies and medications reviewed and updated.  Review of Systems  Constitutional: Negative.   Respiratory: Negative.   Cardiovascular: Negative.   Neurological: Positive for headaches. Negative for dizziness, tremors, seizures, syncope, facial asymmetry, speech difficulty, weakness, light-headedness and numbness.  Psychiatric/Behavioral: Negative.     Per HPI unless specifically indicated above     Objective:    BP 128/79 (BP Location: Left Arm, Patient Position: Sitting, Cuff Size: Large)   Pulse 73   Wt 201 lb 7 oz (91.4 kg)   SpO2 100%   BMI 28.49 kg/m   Wt Readings from Last 3 Encounters:  08/01/17 201 lb 7 oz (91.4 kg)  07/12/17 198 lb (89.8 kg)  07/04/17 200 lb 3 oz (90.8 kg)    Physical Exam  Constitutional: He is oriented to person, place, and time. He appears well-developed and well-nourished. No distress.  HENT:  Head: Normocephalic and atraumatic.  Right Ear: Hearing normal.  Left Ear: Hearing normal.  Nose: Nose normal.  Eyes: Conjunctivae and lids are normal. Right eye exhibits no discharge. Left eye exhibits no discharge. No scleral icterus.  Cardiovascular: Normal rate, regular rhythm, normal heart sounds and intact distal pulses.  Exam reveals no gallop and no friction rub.   No murmur heard. Pulmonary/Chest: Effort normal and breath sounds normal. No respiratory distress. He has no wheezes. He has no rales. He exhibits no tenderness.    Musculoskeletal: Normal range of motion.  Neurological: He is alert and oriented to person, place, and time.  Skin: Skin is warm, dry and intact. No rash noted. He is not diaphoretic. No erythema. No pallor.  Psychiatric: He has a normal mood and affect. His speech is normal and behavior is normal. Judgment and thought content normal. Cognition and memory are normal.  Nursing note and vitals reviewed.   Results for orders placed or performed in visit on 03/08/17  Compliance Drug Analysis, Ur  Result Value Ref Range   Summary FINAL       Assessment & Plan:   Problem List Items Addressed This Visit      Cardiovascular and Mediastinum   Migraine without aura and without status migrainosus, not intractable - Primary    Had a bad couple of weeks. Advised him against taking so much fioricet as it can cause rebound headaches. Will get him Rx for imitrex if he has bad time like that again. It didn't particularly help him in the past, but at least then he has something. Advised him that he can call here and we can get him a toradol shot if that bad in the future. Seeing Dr. Tomi Likens in December.       Relevant Medications   SUMAtriptan (IMITREX) 100 MG tablet   butalbital-acetaminophen-caffeine (FIORICET WITH CODEINE) 50-325-40-30 MG capsule     Other   Adult ADHD    Had neuropsych testing done 07/10/17. Records not available at this time. Will given him 1 month of medication and we will obtain records from neuropsych testing to get formal diagnosis- when that is done, will be happy to give him 3 month supply. Controlled substance agreement signed and in chart.          Follow up plan: Return in about 4 weeks (around 08/29/2017).

## 2017-08-01 NOTE — Assessment & Plan Note (Signed)
Had neuropsych testing done 07/10/17. Records not available at this time. Will given him 1 month of medication and we will obtain records from neuropsych testing to get formal diagnosis- when that is done, will be happy to give him 3 month supply. Controlled substance agreement signed and in chart.

## 2017-08-29 ENCOUNTER — Ambulatory Visit: Payer: Managed Care, Other (non HMO) | Admitting: Family Medicine

## 2017-08-29 ENCOUNTER — Encounter: Payer: Self-pay | Admitting: Family Medicine

## 2017-08-29 VITALS — BP 129/83 | HR 77 | Temp 97.5°F | Wt 202.4 lb

## 2017-08-29 DIAGNOSIS — G43009 Migraine without aura, not intractable, without status migrainosus: Secondary | ICD-10-CM

## 2017-08-29 DIAGNOSIS — F909 Attention-deficit hyperactivity disorder, unspecified type: Secondary | ICD-10-CM

## 2017-08-29 MED ORDER — BUTALBITAL-APAP-CAFF-COD 50-325-40-30 MG PO CAPS
1.0000 | ORAL_CAPSULE | ORAL | 0 refills | Status: DC
Start: 2017-08-29 — End: 2017-09-29

## 2017-08-29 MED ORDER — AMPHETAMINE-DEXTROAMPHETAMINE 10 MG PO TABS: 10.0000 mg | ORAL_TABLET | Freq: Two times a day (BID) | ORAL | 0 refills | Status: DC

## 2017-08-29 NOTE — Assessment & Plan Note (Signed)
Neuropsych testing reviewed and scanned. Stable on current regimen. Controlled substance agreement signed. Rx for 3 months given today. Call with any concerns.

## 2017-08-29 NOTE — Assessment & Plan Note (Addendum)
Doing well on the imitrex, but migraines still not under good control. Refill of fiorecet given today. Will see neurology next month. Call with any concerns.

## 2017-08-29 NOTE — Progress Notes (Signed)
BP 129/83 (BP Location: Left Arm, Patient Position: Sitting, Cuff Size: Large)   Pulse 77   Temp (!) 97.5 F (36.4 C)   Wt 202 lb 6 oz (91.8 kg)   SpO2 100%   BMI 28.63 kg/m    Subjective:    Patient ID: Stephen Wise, male    DOB: [birth year], 46 y.o.   MRN: [id number]  HPI: Stephen Wise is a 46 y.o. male  Chief Complaint  Patient presents with  . ADHD  . Migraine   ADHD FOLLOW UP- records from neuropsych reviewed with confirmation of diagnosis of ADD. ADHD status: controlled Satisfied with current therapy: yes Medication compliance:  excellent compliance Controlled substance contract: yes Previous psychiatry evaluation: yes Previous medications: yes adderall   Taking meds on weekends/vacations: yes Work/school performance:  good Difficulty sustaining attention/completing tasks: no Distracted by extraneous stimuli: no Does not listen when spoken to: no  Fidgets with hands or feet: no Unable to stay in seat: no Blurts out/interrupts others: no ADHD Medication Side Effects: no    Decreased appetite: no    Headache: no    Sleeping disturbance pattern: no    Irritability: no    Rebound effects (worse than baseline) off medication: no    Anxiousness: no    Dizziness: no    Tics: no  MIGRAINES- migraines are about the same. Seeing neurology in a couple of weeks. Finds that the imitex is really helping. Still taking the fioricet  Duration: chronic Onset: sudden Severity: severe Quality: sharp Frequency: 1x week Location: L temporal Headache duration: 15 minutes to hours Radiation: yes- into the back of his head Time of day headache occurs: at random Alleviating factors: imitrex, fiorect Aggravating factors: sunlight, reflections, problems with his vision, got worse with a change in his contacts Headache status at time of visit: asymptomatic Treatments attempted: Treatments attempted: imitrex, maxalt, fiorecet, fiorcet with codiene, topamax, amitriptyline,  propranolol  Aura: no Nausea:  yes Vomiting: no Photophobia:  yes Phonophobia:  no Effect on social functioning:  yes Numbers of missed days of school/work each month: 3-4x a month Confusion:  yes Gait disturbance/ataxia:  no Behavioral changes:  no Fevers:  no  Relevant past medical, surgical, family and social history reviewed and updated as indicated. Interim medical history since our last visit reviewed. Allergies and medications reviewed and updated.  Review of Systems  Constitutional: Negative.   Respiratory: Negative.   Cardiovascular: Negative.   Neurological: Positive for headaches. Negative for dizziness, tremors, seizures, syncope, facial asymmetry, speech difficulty, weakness, light-headedness and numbness.  Psychiatric/Behavioral: Negative.     Per HPI unless specifically indicated above     Objective:    BP 129/83 (BP Location: Left Arm, Patient Position: Sitting, Cuff Size: Large)   Pulse 77   Temp (!) 97.5 F (36.4 C)   Wt 202 lb 6 oz (91.8 kg)   SpO2 100%   BMI 28.63 kg/m   Wt Readings from Last 3 Encounters:  08/29/17 202 lb 6 oz (91.8 kg)  08/01/17 201 lb 7 oz (91.4 kg)  07/12/17 198 lb (89.8 kg)    Physical Exam  Constitutional: He is oriented to person, place, and time. He appears well-developed and well-nourished. No distress.  HENT:  Head: Normocephalic and atraumatic.  Right Ear: Hearing normal.  Left Ear: Hearing normal.  Nose: Nose normal.  Eyes: Conjunctivae and lids are normal. Right eye exhibits no discharge. Left eye exhibits no discharge. No scleral icterus.  Cardiovascular: Normal rate, regular rhythm, normal  heart sounds and intact distal pulses. Exam reveals no gallop and no friction rub.  No murmur heard. Pulmonary/Chest: Effort normal and breath sounds normal. No respiratory distress. He has no wheezes. He has no rales. He exhibits no tenderness.  Musculoskeletal: Normal range of motion.  Neurological: He is alert and  oriented to person, place, and time.  Skin: Skin is warm, dry and intact. No rash noted. He is not diaphoretic. No erythema. No pallor.  Psychiatric: He has a normal mood and affect. His speech is normal and behavior is normal. Judgment and thought content normal. Cognition and memory are normal.  Nursing note and vitals reviewed.   Results for orders placed or performed in visit on 03/08/17  Compliance Drug Analysis, Ur  Result Value Ref Range   Summary FINAL       Assessment & Plan:   Problem List Items Addressed This Visit      Cardiovascular and Mediastinum   Migraine without aura and without status migrainosus, not intractable    Doing well on the imitrex, but migraines still not under good control. Refill of fiorecet given today. Will see neurology next month. Call with any concerns.       Relevant Medications   butalbital-acetaminophen-caffeine (FIORICET WITH CODEINE) 50-325-40-30 MG capsule     Other   Adult ADHD - Primary    Neuropsych testing reviewed and scanned. Stable on current regimen. Controlled substance agreement signed. Rx for 3 months given today. Call with any concerns.           Follow up plan: Return in about 3 months (around 11/29/2017) for ADD follow up.

## 2017-09-07 ENCOUNTER — Other Ambulatory Visit: Payer: Self-pay

## 2017-09-07 ENCOUNTER — Encounter: Payer: Self-pay | Admitting: Anesthesiology

## 2017-09-07 ENCOUNTER — Ambulatory Visit: Payer: Managed Care, Other (non HMO) | Attending: Anesthesiology | Admitting: Anesthesiology

## 2017-09-07 VITALS — BP 137/86 | HR 66 | Temp 98.5°F | Resp 18 | Ht 70.0 in | Wt 195.0 lb

## 2017-09-07 DIAGNOSIS — M5136 Other intervertebral disc degeneration, lumbar region: Secondary | ICD-10-CM | POA: Diagnosis not present

## 2017-09-07 DIAGNOSIS — M79642 Pain in left hand: Secondary | ICD-10-CM | POA: Diagnosis not present

## 2017-09-07 DIAGNOSIS — M47816 Spondylosis without myelopathy or radiculopathy, lumbar region: Secondary | ICD-10-CM

## 2017-09-07 DIAGNOSIS — Z79891 Long term (current) use of opiate analgesic: Secondary | ICD-10-CM | POA: Diagnosis not present

## 2017-09-07 DIAGNOSIS — M545 Low back pain, unspecified: Secondary | ICD-10-CM

## 2017-09-07 DIAGNOSIS — M4696 Unspecified inflammatory spondylopathy, lumbar region: Secondary | ICD-10-CM

## 2017-09-07 DIAGNOSIS — Z79899 Other long term (current) drug therapy: Secondary | ICD-10-CM | POA: Diagnosis not present

## 2017-09-07 DIAGNOSIS — M542 Cervicalgia: Secondary | ICD-10-CM | POA: Insufficient documentation

## 2017-09-07 DIAGNOSIS — M5137 Other intervertebral disc degeneration, lumbosacral region: Secondary | ICD-10-CM

## 2017-09-07 DIAGNOSIS — M549 Dorsalgia, unspecified: Secondary | ICD-10-CM | POA: Diagnosis present

## 2017-09-07 DIAGNOSIS — G8929 Other chronic pain: Secondary | ICD-10-CM

## 2017-09-07 DIAGNOSIS — G894 Chronic pain syndrome: Secondary | ICD-10-CM | POA: Diagnosis not present

## 2017-09-07 DIAGNOSIS — Z7982 Long term (current) use of aspirin: Secondary | ICD-10-CM | POA: Diagnosis not present

## 2017-09-07 DIAGNOSIS — M79641 Pain in right hand: Secondary | ICD-10-CM | POA: Insufficient documentation

## 2017-09-07 MED ORDER — HYDROCODONE-ACETAMINOPHEN 7.5-325 MG PO TABS: 1.0000 | ORAL_TABLET | Freq: Four times a day (QID) | ORAL | 0 refills | Status: DC | PRN

## 2017-09-07 NOTE — Progress Notes (Signed)
Nursing Pain Medication Assessment:  Safety precautions to be maintained throughout the outpatient stay will include: orient to surroundings, keep bed in low position, maintain call bell within reach at all times, provide assistance with transfer out of bed and ambulation.  Medication Inspection Compliance: Pill count conducted under aseptic conditions, in front of the patient. Neither the pills nor the bottle was removed from the patient's sight at any time. Once count was completed pills were immediately returned to the patient in their original bottle.  Medication: Hydrocodone/APAP Pill/Patch Count: 0 of 75 pills remain Pill/Patch Appearance: Markings consistent with prescribed medication Bottle Appearance: Standard pharmacy container. Clearly labeled. Filled Date: 8 / 09 / 2018 Last Medication intake:  Today

## 2017-09-07 NOTE — Patient Instructions (Signed)
GENERAL RISKS AND COMPLICATIONS  What are the risk, side effects and possible complications? Generally speaking, most procedures are safe.  However, with any procedure there are risks, side effects, and the possibility of complications.  The risks and complications are dependent upon the sites that are lesioned, or the type of nerve block to be performed.  The closer the procedure is to the spine, the more serious the risks are.  Great care is taken when placing the radio frequency needles, block needles or lesioning probes, but sometimes complications can occur. 1. Infection: Any time there is an injection through the skin, there is a risk of infection.  This is why sterile conditions are used for these blocks.  There are four possible types of infection. 1. Localized skin infection. 2. Central Nervous System Infection-This can be in the form of Meningitis, which can be deadly. 3. Epidural Infections-This can be in the form of an epidural abscess, which can cause pressure inside of the spine, causing compression of the spinal cord with subsequent paralysis. This would require an emergency surgery to decompress, and there are no guarantees that the patient would recover from the paralysis. 4. Discitis-This is an infection of the intervertebral discs.  It occurs in about 1% of discography procedures.  It is difficult to treat and it may lead to surgery.        2. Pain: the needles have to go through skin and soft tissues, will cause soreness.       3. Damage to internal structures:  The nerves to be lesioned may be near blood vessels or    other nerves which can be potentially damaged.       4. Bleeding: Bleeding is more common if the patient is taking blood thinners such as  aspirin, Coumadin, Ticiid, Plavix, etc., or if he/she have some genetic predisposition  such as hemophilia. Bleeding into the spinal canal can cause compression of the spinal  cord with subsequent paralysis.  This would require an  emergency surgery to  decompress and there are no guarantees that the patient would recover from the  paralysis.       5. Pneumothorax:  Puncturing of a lung is a possibility, every time a needle is introduced in  the area of the chest or upper back.  Pneumothorax refers to free air around the  collapsed lung(s), inside of the thoracic cavity (chest cavity).  Another two possible  complications related to a similar event would include: Hemothorax and Chylothorax.   These are variations of the Pneumothorax, where instead of air around the collapsed  lung(s), you may have blood or chyle, respectively.       6. Spinal headaches: They may occur with any procedures in the area of the spine.       7. Persistent CSF (Cerebro-Spinal Fluid) leakage: This is a rare problem, but may occur  with prolonged intrathecal or epidural catheters either due to the formation of a fistulous  track or a dural tear.       8. Nerve damage: By working so close to the spinal cord, there is always a possibility of  nerve damage, which could be as serious as a permanent spinal cord injury with  paralysis.       9. Death:  Although rare, severe deadly allergic reactions known as "Anaphylactic  reaction" can occur to any of the medications used.      10. Worsening of the symptoms:  We can always make thing worse.  What are the chances of something like this happening? Chances of any of this occuring are extremely low.  By statistics, you have more of a chance of getting killed in a motor vehicle accident: while driving to the hospital than any of the above occurring .  Nevertheless, you should be aware that they are possibilities.  In general, it is similar to taking a shower.  Everybody knows that you can slip, hit your head and get killed.  Does that mean that you should not shower again?  Nevertheless always keep in mind that statistics do not mean anything if you happen to be on the wrong side of them.  Even if a procedure has a 1  (one) in a 1,000,000 (million) chance of going wrong, it you happen to be that one..Also, keep in mind that by statistics, you have more of a chance of having something go wrong when taking medications.  Who should not have this procedure? If you are on a blood thinning medication (e.g. Coumadin, Plavix, see list of "Blood Thinners"), or if you have an active infection going on, you should not have the procedure.  If you are taking any blood thinners, please inform your physician.  How should I prepare for this procedure?  Do not eat or drink anything at least six hours prior to the procedure.  Bring a driver with you .  It cannot be a taxi.  Come accompanied by an adult that can drive you back, and that is strong enough to help you if your legs get weak or numb from the local anesthetic.  Take all of your medicines the morning of the procedure with just enough water to swallow them.  If you have diabetes, make sure that you are scheduled to have your procedure done first thing in the morning, whenever possible.  If you have diabetes, take only half of your insulin dose and notify our nurse that you have done so as soon as you arrive at the clinic.  If you are diabetic, but only take blood sugar pills (oral hypoglycemic), then do not take them on the morning of your procedure.  You may take them after you have had the procedure.  Do not take aspirin or any aspirin-containing medications, at least eleven (11) days prior to the procedure.  They may prolong bleeding.  Wear loose fitting clothing that may be easy to take off and that you would not mind if it got stained with Betadine or blood.  Do not wear any jewelry or perfume  Remove any nail coloring.  It will interfere with some of our monitoring equipment.  NOTE: Remember that this is not meant to be interpreted as a complete list of all possible complications.  Unforeseen problems may occur.  BLOOD THINNERS The following drugs  contain aspirin or other products, which can cause increased bleeding during surgery and should not be taken for 2 weeks prior to and 1 week after surgery.  If you should need take something for relief of minor pain, you may take acetaminophen which is found in Tylenol,m Datril, Anacin-3 and Panadol. It is not blood thinner. The products listed below are.  Do not take any of the products listed below in addition to any listed on your instruction sheet.  A.P.C or A.P.C with Codeine Codeine Phosphate Capsules #3 Ibuprofen Ridaura  ABC compound Congesprin Imuran rimadil  Advil Cope Indocin Robaxisal  Alka-Seltzer Effervescent Pain Reliever and Antacid Coricidin or Coricidin-D  Indomethacin Rufen  Alka-Seltzer plus Cold Medicine Cosprin Ketoprofen S-A-C Tablets  Anacin Analgesic Tablets or Capsules Coumadin Korlgesic Salflex  Anacin Extra Strength Analgesic tablets or capsules CP-2 Tablets Lanoril Salicylate  Anaprox Cuprimine Capsules Levenox Salocol  Anexsia-D Dalteparin Magan Salsalate  Anodynos Darvon compound Magnesium Salicylate Sine-off  Ansaid Dasin Capsules Magsal Sodium Salicylate  Anturane Depen Capsules Marnal Soma  APF Arthritis pain formula Dewitt's Pills Measurin Stanback  Argesic Dia-Gesic Meclofenamic Sulfinpyrazone  Arthritis Bayer Timed Release Aspirin Diclofenac Meclomen Sulindac  Arthritis pain formula Anacin Dicumarol Medipren Supac  Analgesic (Safety coated) Arthralgen Diffunasal Mefanamic Suprofen  Arthritis Strength Bufferin Dihydrocodeine Mepro Compound Suprol  Arthropan liquid Dopirydamole Methcarbomol with Aspirin Synalgos  ASA tablets/Enseals Disalcid Micrainin Tagament  Ascriptin Doan's Midol Talwin  Ascriptin A/D Dolene Mobidin Tanderil  Ascriptin Extra Strength Dolobid Moblgesic Ticlid  Ascriptin with Codeine Doloprin or Doloprin with Codeine Momentum Tolectin  Asperbuf Duoprin Mono-gesic Trendar  Aspergum Duradyne Motrin or Motrin IB Triminicin  Aspirin  plain, buffered or enteric coated Durasal Myochrisine Trigesic  Aspirin Suppositories Easprin Nalfon Trillsate  Aspirin with Codeine Ecotrin Regular or Extra Strength Naprosyn Uracel  Atromid-S Efficin Naproxen Ursinus  Auranofin Capsules Elmiron Neocylate Vanquish  Axotal Emagrin Norgesic Verin  Azathioprine Empirin or Empirin with Codeine Normiflo Vitamin E  Azolid Emprazil Nuprin Voltaren  Bayer Aspirin plain, buffered or children's or timed BC Tablets or powders Encaprin Orgaran Warfarin Sodium  Buff-a-Comp Enoxaparin Orudis Zorpin  Buff-a-Comp with Codeine Equegesic Os-Cal-Gesic   Buffaprin Excedrin plain, buffered or Extra Strength Oxalid   Bufferin Arthritis Strength Feldene Oxphenbutazone   Bufferin plain or Extra Strength Feldene Capsules Oxycodone with Aspirin   Bufferin with Codeine Fenoprofen Fenoprofen Pabalate or Pabalate-SF   Buffets II Flogesic Panagesic   Buffinol plain or Extra Strength Florinal or Florinal with Codeine Panwarfarin   Buf-Tabs Flurbiprofen Penicillamine   Butalbital Compound Four-way cold tablets Penicillin   Butazolidin Fragmin Pepto-Bismol   Carbenicillin Geminisyn Percodan   Carna Arthritis Reliever Geopen Persantine   Carprofen Gold's salt Persistin   Chloramphenicol Goody's Phenylbutazone   Chloromycetin Haltrain Piroxlcam   Clmetidine heparin Plaquenil   Cllnoril Hyco-pap Ponstel   Clofibrate Hydroxy chloroquine Propoxyphen         Before stopping any of these medications, be sure to consult the physician who ordered them.  Some, such as Coumadin (Warfarin) are ordered to prevent or treat serious conditions such as "deep thrombosis", "pumonary embolisms", and other heart problems.  The amount of time that you may need off of the medication may also vary with the medication and the reason for which you were taking it.  If you are taking any of these medications, please make sure you notify your pain physician before you undergo any  procedures.         Trigger Point Injection Trigger points are areas where you have pain. A trigger point injection is a shot given in the trigger point to help relieve pain for a few days to a few months. Common places for trigger points include:  The neck.  The shoulders.  The upper back.  The lower back.  A trigger point injection will not cure long-lasting (chronic) pain permanently. These injections do not always work for every person, but for some people they can help to relieve pain for a few days to a few months. Tell a health care provider about:  Any allergies you have.  All medicines you are taking, including vitamins, herbs, eye drops, creams, and over-the-counter medicines.  Any problems you  or family members have had with anesthetic medicines.  Any blood disorders you have.  Any surgeries you have had.  Any medical conditions you have. What are the risks? Generally, this is a safe procedure. However, problems may occur, including:  Infection.  Bleeding.  Allergic reaction to the injected medicine.  Irritation of the skin around the injection site.  What happens before the procedure?  Ask your health care provider about changing or stopping your regular medicines. This is especially important if you are taking diabetes medicines or blood thinners. What happens during the procedure?  Your health care provider will feel for trigger points. A marker may be used to circle the area for the injection.  The skin over the trigger point will be washed with a germ-killing (antiseptic) solution.  A thin needle is used for the shot. You may feel pain or a twitching feeling when the needle enters the trigger point.  A numbing solution may be injected into the trigger point. Sometimes a medicine to keep down swelling, redness, and warmth (inflammation) is also injected.  Your health care provider may move the needle around the area where the trigger point is  located until the tightness and twitching goes away.  After the injection, your health care provider may put gentle pressure over the injection site.  The injection site will be covered with a bandage (dressing). The procedure may vary among health care providers and hospitals. What happens after the procedure?  The dressing can be taken off in a few hours or as told by your health care provider.  You may feel sore and stiff for 1-2 days. This information is not intended to replace advice given to you by your health care provider. Make sure you discuss any questions you have with your health care provider. Document Released: 09/08/2011 Document Revised: 05/22/2016 Document Reviewed: 03/09/2015 Elsevier Interactive Patient Education  2018 [employer].

## 2017-09-12 NOTE — Progress Notes (Signed)
Subjective:  Patient ID: Stephen Wise, male    DOB: 07-05-1971  Age: 46 y.o. MRN: [id number]  CC: Back Pain (low and equal bilaterally)   Procedure: None  HPI Asahd Can presents for reevaluation.  His last seen a few months ago and had trigger point injections at that time.  He generally gets several weeks of relief but this has recurred.  It is of the same quality characteristic distribution primarily affecting the lumbar region.  No change in lower extremity strength and function has been noted bowel bladder function has been stable.  He has been taking hydrocodone 5 mg strength up to 3 times a day to help with his pain relief.  Based on his narcotic assessment sheet he continues to derive functional improvement with the medications without any significant side effects mentioned.  He continues to do some stretching strengthening exercises as well but unfortunately the pain in the lumbar region has been quite recalcitrant to conservative therapy.  Outpatient Medications Prior to Visit  Medication Sig Dispense Refill  . amphetamine-dextroamphetamine (ADDERALL) 10 MG tablet Take 1 tablet (10 mg total) by mouth 2 (two) times daily. 60 tablet 0  . Aspirin-Acetaminophen-Caffeine (EXCEDRIN MIGRAINE PO) Take by mouth.    . butalbital-acetaminophen-caffeine (FIORICET WITH CODEINE) 50-325-40-30 MG capsule Take 1 capsule by mouth every 4 (four) hours. 60 capsule 0  . esomeprazole (NEXIUM) 40 MG capsule Take 40 mg by mouth.    . mesalamine (PENTASA) 500 MG CR capsule Take 1,000 mg by mouth 3 (three) times daily as needed.     . propranolol ER (INDERAL LA) 80 MG 24 hr capsule Take 1 capsule (80 mg total) by mouth daily. 30 capsule 3  . SUMAtriptan (IMITREX) 100 MG tablet Take 1 tablet (100 mg total) by mouth every 2 (two) hours as needed for migraine. May repeat in 2 hours if headache persists or recurs. 10 tablet 12  . HYDROcodone-acetaminophen (NORCO) 7.5-325 MG tablet Take 1 tablet by mouth every 6  (six) hours as needed for moderate pain or severe pain. 75 tablet 0   No facility-administered medications prior to visit.     Review of Systems CNS: No confusion or sedation GI: No constipation or abdominal pain Cardiac: No angina or palpitations  Objective:  BP 137/86   Pulse 66   Temp 98.5 F (36.9 C) (Oral)   Resp 18   Ht 5' 10"  (1.778 m)   Wt 195 lb (88.5 kg)   SpO2 100%   BMI 27.98 kg/m    BP Readings from Last 3 Encounters:  09/07/17 137/86  08/29/17 129/83  08/01/17 128/79     Wt Readings from Last 3 Encounters:  09/07/17 195 lb (88.5 kg)  08/29/17 202 lb 6 oz (91.8 kg)  08/01/17 201 lb 7 oz (91.4 kg)     Physical Exam Pt is alert and oriented PERRL EOMI HEART IS RRR no murmur or rub LCTA no wheezing or rhales MUSCULOSKELETAL reveals some trigger points in the lumbar region.  No significant pain on extension left or right rotation is provocative for pain.  His muscle tone and bulk in the lower extremities also at baseline.  Labs  No results found for: HGBA1C No results found for: GLUF, MICROALBUR, LDLCALC, CREATININE  -------------------------------------------------------------------------------------------------------------------- No results found for: WBC, HGB, HCT, PLT, GLUCOSE, CHOL, TRIG, HDL, LDLDIRECT, LDLCALC, ALT, AST, NA, K, CL, CREATININE, BUN, CO2, TSH, PSA, INR, GLUF, HGBA1C, MICROALBUR  --------------------------------------------------------------------------------------------------------------------- Mr Brain Wo Contrast  Result Date: 06/30/2017 CLINICAL DATA:  Chronic migraines, worsening in intensity. EXAM: MRI HEAD WITHOUT CONTRAST TECHNIQUE: Multiplanar, multiecho pulse sequences of the brain and surrounding structures were obtained without intravenous contrast. COMPARISON:  None. FINDINGS: Brain: No infarction, hemorrhage, hydrocephalus, extra-axial collection or mass lesion. Single FLAIR hyperintense focus in the left cerebral  white matter, allowable for age and nonspecific. Vascular: Major vessels are patent Skull and upper cervical spine: No evidence of bone lesion. Sinuses/Orbits: No sinusitis or orbital finding to explain headache. IMPRESSION: Negative brain MRI.  No explanation for headache. Electronically Signed   By: Monte Fantasia M.D.   On: 06/30/2017 09:02     Assessment & Plan:   Capri was seen today for back pain.  Diagnoses and all orders for this visit:  Neck pain  Long term current use of opiate analgesic  Chronic pain syndrome  Chronic left-sided low back pain without sciatica -     INJECT TRIGGER POINT, 1 OR 2  Facet arthritis of lumbar region ([place])  Chronic bilateral low back pain without sciatica -     INJECT TRIGGER POINT, 1 OR 2  Disc disease, degenerative, lumbar or lumbosacral  Pain in both hands  Other orders -     Discontinue: HYDROcodone-acetaminophen (NORCO) 7.5-325 MG tablet; Take 1 tablet by mouth every 6 (six) hours as needed for moderate pain or severe pain. -     HYDROcodone-acetaminophen (NORCO) 7.5-325 MG tablet; Take 1 tablet by mouth every 6 (six) hours as needed for moderate pain or severe pain.        ----------------------------------------------------------------------------------------------------------------------  Problem List Items Addressed This Visit      Unprioritized   Chronic pain syndrome (Chronic)   Disc disease, degenerative, lumbar or lumbosacral (Chronic)   Relevant Medications   HYDROcodone-acetaminophen (NORCO) 7.5-325 MG tablet   Hand pain (Chronic)   Long term current use of opiate analgesic (Chronic)   Neck pain - Primary (Chronic)    Other Visit Diagnoses    Chronic left-sided low back pain without sciatica       Relevant Medications   HYDROcodone-acetaminophen (NORCO) 7.5-325 MG tablet   Other Relevant Orders   INJECT TRIGGER POINT, 1 OR 2   Facet arthritis of lumbar region ([place])       Relevant Medications    HYDROcodone-acetaminophen (NORCO) 7.5-325 MG tablet   Chronic bilateral low back pain without sciatica       Relevant Medications   HYDROcodone-acetaminophen (NORCO) 7.5-325 MG tablet   Other Relevant Orders   INJECT TRIGGER POINT, 1 OR 2        ----------------------------------------------------------------------------------------------------------------------  1. Neck pain Continue with stretching strengthening exercises as reviewed.  We talked about some weight training exercises and limitation of total weight and proper technique  2. Long term current use of opiate analgesic We reviewed the [facility] practitioner database information and it is appropriate.  Refills were given today for December 6 and January 5.  He is to return to clinic in approximate 1-2 months for possible trigger point injections at that time.  3. Chronic pain syndrome Above  4. Chronic left-sided low back pain without sciatica Above - INJECT TRIGGER POINT, 1 OR 2  5. Facet arthritis of lumbar region [facility]) Continue with core stretching strengthening for lumbar support and aerobic conditioning as reviewed once again today.  6. Chronic bilateral low back pain without sciatica - INJECT TRIGGER POINT, 1 OR 2  7. Disc disease, degenerative, lumbar or lumbosacral   8. Pain in both hands     ----------------------------------------------------------------------------------------------------------------------  I am having [name] maintain his mesalamine, Aspirin-Acetaminophen-Caffeine (EXCEDRIN MIGRAINE PO), propranolol ER, SUMAtriptan, esomeprazole, butalbital-acetaminophen-caffeine, amphetamine-dextroamphetamine, and HYDROcodone-acetaminophen.   Meds ordered this encounter  Medications  . DISCONTD: HYDROcodone-acetaminophen (NORCO) 7.5-325 MG tablet    Sig: Take 1 tablet by mouth every 6 (six) hours as needed for moderate pain or severe pain.    Dispense:  75 tablet    Refill:  0  .  HYDROcodone-acetaminophen (NORCO) 7.5-325 MG tablet    Sig: Take 1 tablet by mouth every 6 (six) hours as needed for moderate pain or severe pain.    Dispense:  75 tablet    Refill:  0    Do not fill until [id number]     Medication List        Accurate as of 09/07/17 11:59 PM. Always use your most recent med list.          amphetamine-dextroamphetamine 10 MG tablet Commonly known as:  ADDERALL Take 1 tablet (10 mg total) by mouth 2 (two) times daily.   butalbital-acetaminophen-caffeine 50-325-40-30 MG capsule Commonly known as:  FIORICET WITH CODEINE Take 1 capsule by mouth every 4 (four) hours.   esomeprazole 40 MG capsule Commonly known as:  NEXIUM   EXCEDRIN MIGRAINE PO   HYDROcodone-acetaminophen 7.5-325 MG tablet Commonly known as:  NORCO Take 1 tablet by mouth every 6 (six) hours as needed for moderate pain or severe pain.   mesalamine 500 MG CR capsule Commonly known as:  PENTASA   propranolol ER 80 MG 24 hr capsule Commonly known as:  INDERAL LA Take 1 capsule (80 mg total) by mouth daily.   SUMAtriptan 100 MG tablet Commonly known as:  IMITREX Take 1 tablet (100 mg total) by mouth every 2 (two) hours as needed for migraine. May repeat in 2 hours if headache persists or recurs.       Where to Get Your Medications    You can get these medications from any pharmacy   Bring a paper prescription for each of these medications  HYDROcodone-acetaminophen 7.5-325 MG tablet    ----------------------------------------------------------------------------------------------------------------------  Follow-up: Return in about 1 month (around 10/08/2017) for evaluation, procedure.    Molli Barrows, MD

## 2017-09-13 ENCOUNTER — Ambulatory Visit: Payer: Managed Care, Other (non HMO) | Admitting: Neurology

## 2017-09-27 ENCOUNTER — Other Ambulatory Visit: Payer: Self-pay | Admitting: Family Medicine

## 2017-09-27 NOTE — Telephone Encounter (Signed)
Copied from [place]. Topic: Quick Communication - Rx Refill/Question >> Sep 27, 2017  1:32 PM Marin Olp L wrote: Has the patient contacted their pharmacy? No. (no refills)   (Agent: If no, request that the patient contact the pharmacy for the refill.)   Preferred Pharmacy (with phone number or street name): [id number] [address] [address] (not sure if he picks up in office)   Agent: Please be advised that RX refills may take up to 3 business days. We ask that you follow-up with your pharmacy. Refill butalbital-acetaminophen-caffeine (FIORICET WITH CODEINE) 50-325-40-30 MG capsule

## 2017-09-27 NOTE — Telephone Encounter (Signed)
request for controlled substance

## 2017-09-29 ENCOUNTER — Telehealth: Payer: Self-pay | Admitting: Family Medicine

## 2017-09-29 MED ORDER — BUTALBITAL-APAP-CAFF-COD 50-325-40-30 MG PO CAPS: 1.0000 | ORAL_CAPSULE | ORAL | 0 refills | Status: DC

## 2017-09-29 NOTE — Telephone Encounter (Signed)
OK to call fiorecet into his pharmacy. Written.

## 2017-09-29 NOTE — Telephone Encounter (Signed)
Called RX into pharmacy. Called and let patient know that this was done for him.

## 2017-09-29 NOTE — Telephone Encounter (Signed)
Copied from [place]. Topic: Quick Communication - Rx Refill/Question >> Sep 27, 2017  1:32 PM Marin Olp L wrote: Has the patient contacted their pharmacy? No. (no refills)   (Agent: If no, request that the patient contact the pharmacy for the refill.)   Preferred Pharmacy (with phone number or street name[id number] [address] (not sure if he picks up in office)   Agent: Please be advised that RX refills may take up to 3 business days. We ask that you follow-up with your pharmacy. Refill butalbital-acetaminophen-caffeine (FIORICET WITH CODEINE) 50-325-40-30 MG capsule   Pt. Wife calling to check on status of refill requesting that someone gives pt. A call if he will be able to get rx. Or not

## 2017-10-11 ENCOUNTER — Ambulatory Visit: Payer: Managed Care, Other (non HMO) | Admitting: Neurology

## 2017-10-27 ENCOUNTER — Telehealth: Payer: Self-pay | Admitting: Family Medicine

## 2017-10-27 NOTE — Telephone Encounter (Signed)
Too early for a refill

## 2017-10-27 NOTE — Telephone Encounter (Signed)
Pt would like a refill for butalbital.

## 2017-10-27 NOTE — Telephone Encounter (Signed)
Pt.notified

## 2017-10-30 ENCOUNTER — Telehealth: Payer: Self-pay | Admitting: Family Medicine

## 2017-10-30 NOTE — Telephone Encounter (Signed)
Copied from [place] [id number]. Topic: Quick Communication - Rx Refill/Question >> Oct 30, 2017 12:05 PM Lolita Rieger, RMA wrote: Medication: fiorcet   Has the patient contacted their pharmacy?no   (Agent: If no, request that the patient contact the pharmacy for the refill.)   Preferred Pharmacy (with phone number or street name): [facility]: Please be advised that RX refills may take up to 3 business days. We ask that you follow-up with your pharmacy.

## 2017-10-30 NOTE — Telephone Encounter (Signed)
Pt requesting butalital-acetaminophen-caffeine refill Last OV: 08/29/17 Last Refill:09/29/17 #60 no refill Pharmacy:[facility] [address]

## 2017-10-30 NOTE — Telephone Encounter (Signed)
Copied from [place] [id number]. Topic: Quick Communication - Rx Refill/Question >> Oct 30, 2017  4:44 PM Corie Chiquito, [place] wrote: Medication: Butalbital-Acetaminophen-Caffeine   Has the patient contacted their pharmacy? Yes  Patient stated that he has been in contact with his pharmacy but still hasn't received his medication refill    Preferred Pharmacy (with phone number or street name): [facility] [phone]   Agent: Please be advised that RX refills may take up to 3 business days. We ask that you follow-up with your pharmacy.

## 2017-10-31 MED ORDER — BUTALBITAL-APAP-CAFF-COD 50-325-40-30 MG PO CAPS: 1.0000 | ORAL_CAPSULE | ORAL | 0 refills | Status: DC

## 2017-10-31 NOTE — Telephone Encounter (Signed)
Rx up front for him to pick up

## 2017-11-06 ENCOUNTER — Encounter: Payer: Self-pay | Admitting: Family Medicine

## 2017-11-13 ENCOUNTER — Encounter: Payer: Self-pay | Admitting: Anesthesiology

## 2017-11-13 ENCOUNTER — Ambulatory Visit: Payer: Managed Care, Other (non HMO) | Attending: Anesthesiology | Admitting: Anesthesiology

## 2017-11-13 ENCOUNTER — Other Ambulatory Visit: Payer: Self-pay

## 2017-11-13 VITALS — BP 131/84 | HR 84 | Temp 98.7°F | Resp 16 | Ht 70.0 in | Wt 200.0 lb

## 2017-11-13 DIAGNOSIS — M542 Cervicalgia: Secondary | ICD-10-CM

## 2017-11-13 DIAGNOSIS — M5137 Other intervertebral disc degeneration, lumbosacral region: Secondary | ICD-10-CM

## 2017-11-13 DIAGNOSIS — Z79899 Other long term (current) drug therapy: Secondary | ICD-10-CM | POA: Diagnosis not present

## 2017-11-13 DIAGNOSIS — M79641 Pain in right hand: Secondary | ICD-10-CM | POA: Insufficient documentation

## 2017-11-13 DIAGNOSIS — M545 Low back pain, unspecified: Secondary | ICD-10-CM

## 2017-11-13 DIAGNOSIS — Z79891 Long term (current) use of opiate analgesic: Secondary | ICD-10-CM

## 2017-11-13 DIAGNOSIS — G43909 Migraine, unspecified, not intractable, without status migrainosus: Secondary | ICD-10-CM | POA: Insufficient documentation

## 2017-11-13 DIAGNOSIS — G8929 Other chronic pain: Secondary | ICD-10-CM | POA: Diagnosis not present

## 2017-11-13 DIAGNOSIS — M4696 Unspecified inflammatory spondylopathy, lumbar region: Secondary | ICD-10-CM | POA: Diagnosis not present

## 2017-11-13 DIAGNOSIS — M51379 Other intervertebral disc degeneration, lumbosacral region without mention of lumbar back pain or lower extremity pain: Secondary | ICD-10-CM

## 2017-11-13 DIAGNOSIS — Z7982 Long term (current) use of aspirin: Secondary | ICD-10-CM | POA: Insufficient documentation

## 2017-11-13 DIAGNOSIS — G894 Chronic pain syndrome: Secondary | ICD-10-CM | POA: Diagnosis not present

## 2017-11-13 DIAGNOSIS — M4316 Spondylolisthesis, lumbar region: Secondary | ICD-10-CM

## 2017-11-13 DIAGNOSIS — M5136 Other intervertebral disc degeneration, lumbar region: Secondary | ICD-10-CM | POA: Insufficient documentation

## 2017-11-13 DIAGNOSIS — M47816 Spondylosis without myelopathy or radiculopathy, lumbar region: Secondary | ICD-10-CM

## 2017-11-13 MED ORDER — HYDROCODONE-ACETAMINOPHEN 7.5-325 MG PO TABS: 1.0000 | ORAL_TABLET | Freq: Three times a day (TID) | ORAL | 0 refills | Status: DC

## 2017-11-13 NOTE — Progress Notes (Signed)
Nursing Pain Medication Assessment:  Safety precautions to be maintained throughout the outpatient stay will include: orient to surroundings, keep bed in low position, maintain call bell within reach at all times, provide assistance with transfer out of bed and ambulation.  Medication Inspection Compliance: Pill count conducted under aseptic conditions, in front of the patient. Neither the pills nor the bottle was removed from the patient's sight at any time. Once count was completed pills were immediately returned to the patient in their original bottle.  Medication: Hydrocodone/APAP Pill/Patch Count: 0 of 75 pills remain Pill/Patch Appearance: Markings consistent with prescribed medication Bottle Appearance: Standard pharmacy container. Clearly labeled. Filled Date: 01/04 / 2019 Last Medication intake:  Ran out of medicine more than 48 hours ago

## 2017-11-13 NOTE — Patient Instructions (Signed)
GENERAL RISKS AND COMPLICATIONS  What are the risk, side effects and possible complications? Generally speaking, most procedures are safe.  However, with any procedure there are risks, side effects, and the possibility of complications.  The risks and complications are dependent upon the sites that are lesioned, or the type of nerve block to be performed.  The closer the procedure is to the spine, the more serious the risks are.  Great care is taken when placing the radio frequency needles, block needles or lesioning probes, but sometimes complications can occur. 1. Infection: Any time there is an injection through the skin, there is a risk of infection.  This is why sterile conditions are used for these blocks.  There are four possible types of infection. 1. Localized skin infection. 2. Central Nervous System Infection-This can be in the form of Meningitis, which can be deadly. 3. Epidural Infections-This can be in the form of an epidural abscess, which can cause pressure inside of the spine, causing compression of the spinal cord with subsequent paralysis. This would require an emergency surgery to decompress, and there are no guarantees that the patient would recover from the paralysis. 4. Discitis-This is an infection of the intervertebral discs.  It occurs in about 1% of discography procedures.  It is difficult to treat and it may lead to surgery.        2. Pain: the needles have to go through skin and soft tissues, will cause soreness.       3. Damage to internal structures:  The nerves to be lesioned may be near blood vessels or    other nerves which can be potentially damaged.       4. Bleeding: Bleeding is more common if the patient is taking blood thinners such as  aspirin, Coumadin, Ticiid, Plavix, etc., or if he/she have some genetic predisposition  such as hemophilia. Bleeding into the spinal canal can cause compression of the spinal  cord with subsequent paralysis.  This would require an  emergency surgery to  decompress and there are no guarantees that the patient would recover from the  paralysis.       5. Pneumothorax:  Puncturing of a lung is a possibility, every time a needle is introduced in  the area of the chest or upper back.  Pneumothorax refers to free air around the  collapsed lung(s), inside of the thoracic cavity (chest cavity).  Another two possible  complications related to a similar event would include: Hemothorax and Chylothorax.   These are variations of the Pneumothorax, where instead of air around the collapsed  lung(s), you may have blood or chyle, respectively.       6. Spinal headaches: They may occur with any procedures in the area of the spine.       7. Persistent CSF (Cerebro-Spinal Fluid) leakage: This is a rare problem, but may occur  with prolonged intrathecal or epidural catheters either due to the formation of a fistulous  track or a dural tear.       8. Nerve damage: By working so close to the spinal cord, there is always a possibility of  nerve damage, which could be as serious as a permanent spinal cord injury with  paralysis.       9. Death:  Although rare, severe deadly allergic reactions known as "Anaphylactic  reaction" can occur to any of the medications used.      10. Worsening of the symptoms:  We can always make thing worse.  What are the chances of something like this happening? Chances of any of this occuring are extremely low.  By statistics, you have more of a chance of getting killed in a motor vehicle accident: while driving to the hospital than any of the above occurring .  Nevertheless, you should be aware that they are possibilities.  In general, it is similar to taking a shower.  Everybody knows that you can slip, hit your head and get killed.  Does that mean that you should not shower again?  Nevertheless always keep in mind that statistics do not mean anything if you happen to be on the wrong side of them.  Even if a procedure has a 1  (one) in a 1,000,000 (million) chance of going wrong, it you happen to be that one..Also, keep in mind that by statistics, you have more of a chance of having something go wrong when taking medications.  Who should not have this procedure? If you are on a blood thinning medication (e.g. Coumadin, Plavix, see list of "Blood Thinners"), or if you have an active infection going on, you should not have the procedure.  If you are taking any blood thinners, please inform your physician.  How should I prepare for this procedure?  Do not eat or drink anything at least six hours prior to the procedure.  Bring a driver with you .  It cannot be a taxi.  Come accompanied by an adult that can drive you back, and that is strong enough to help you if your legs get weak or numb from the local anesthetic.  Take all of your medicines the morning of the procedure with just enough water to swallow them.  If you have diabetes, make sure that you are scheduled to have your procedure done first thing in the morning, whenever possible.  If you have diabetes, take only half of your insulin dose and notify our nurse that you have done so as soon as you arrive at the clinic.  If you are diabetic, but only take blood sugar pills (oral hypoglycemic), then do not take them on the morning of your procedure.  You may take them after you have had the procedure.  Do not take aspirin or any aspirin-containing medications, at least eleven (11) days prior to the procedure.  They may prolong bleeding.  Wear loose fitting clothing that may be easy to take off and that you would not mind if it got stained with Betadine or blood.  Do not wear any jewelry or perfume  Remove any nail coloring.  It will interfere with some of our monitoring equipment.  NOTE: Remember that this is not meant to be interpreted as a complete list of all possible complications.  Unforeseen problems may occur.  BLOOD THINNERS The following drugs  contain aspirin or other products, which can cause increased bleeding during surgery and should not be taken for 2 weeks prior to and 1 week after surgery.  If you should need take something for relief of minor pain, you may take acetaminophen which is found in Tylenol,m Datril, Anacin-3 and Panadol. It is not blood thinner. The products listed below are.  Do not take any of the products listed below in addition to any listed on your instruction sheet.  A.P.C or A.P.C with Codeine Codeine Phosphate Capsules #3 Ibuprofen Ridaura  ABC compound Congesprin Imuran rimadil  Advil Cope Indocin Robaxisal  Alka-Seltzer Effervescent Pain Reliever and Antacid Coricidin or Coricidin-D  Indomethacin Rufen  Alka-Seltzer plus Cold Medicine Cosprin Ketoprofen S-A-C Tablets  Anacin Analgesic Tablets or Capsules Coumadin Korlgesic Salflex  Anacin Extra Strength Analgesic tablets or capsules CP-2 Tablets Lanoril Salicylate  Anaprox Cuprimine Capsules Levenox Salocol  Anexsia-D Dalteparin Magan Salsalate  Anodynos Darvon compound Magnesium Salicylate Sine-off  Ansaid Dasin Capsules Magsal Sodium Salicylate  Anturane Depen Capsules Marnal Soma  APF Arthritis pain formula Dewitt's Pills Measurin Stanback  Argesic Dia-Gesic Meclofenamic Sulfinpyrazone  Arthritis Bayer Timed Release Aspirin Diclofenac Meclomen Sulindac  Arthritis pain formula Anacin Dicumarol Medipren Supac  Analgesic (Safety coated) Arthralgen Diffunasal Mefanamic Suprofen  Arthritis Strength Bufferin Dihydrocodeine Mepro Compound Suprol  Arthropan liquid Dopirydamole Methcarbomol with Aspirin Synalgos  ASA tablets/Enseals Disalcid Micrainin Tagament  Ascriptin Doan's Midol Talwin  Ascriptin A/D Dolene Mobidin Tanderil  Ascriptin Extra Strength Dolobid Moblgesic Ticlid  Ascriptin with Codeine Doloprin or Doloprin with Codeine Momentum Tolectin  Asperbuf Duoprin Mono-gesic Trendar  Aspergum Duradyne Motrin or Motrin IB Triminicin  Aspirin  plain, buffered or enteric coated Durasal Myochrisine Trigesic  Aspirin Suppositories Easprin Nalfon Trillsate  Aspirin with Codeine Ecotrin Regular or Extra Strength Naprosyn Uracel  Atromid-S Efficin Naproxen Ursinus  Auranofin Capsules Elmiron Neocylate Vanquish  Axotal Emagrin Norgesic Verin  Azathioprine Empirin or Empirin with Codeine Normiflo Vitamin E  Azolid Emprazil Nuprin Voltaren  Bayer Aspirin plain, buffered or children's or timed BC Tablets or powders Encaprin Orgaran Warfarin Sodium  Buff-a-Comp Enoxaparin Orudis Zorpin  Buff-a-Comp with Codeine Equegesic Os-Cal-Gesic   Buffaprin Excedrin plain, buffered or Extra Strength Oxalid   Bufferin Arthritis Strength Feldene Oxphenbutazone   Bufferin plain or Extra Strength Feldene Capsules Oxycodone with Aspirin   Bufferin with Codeine Fenoprofen Fenoprofen Pabalate or Pabalate-SF   Buffets II Flogesic Panagesic   Buffinol plain or Extra Strength Florinal or Florinal with Codeine Panwarfarin   Buf-Tabs Flurbiprofen Penicillamine   Butalbital Compound Four-way cold tablets Penicillin   Butazolidin Fragmin Pepto-Bismol   Carbenicillin Geminisyn Percodan   Carna Arthritis Reliever Geopen Persantine   Carprofen Gold's salt Persistin   Chloramphenicol Goody's Phenylbutazone   Chloromycetin Haltrain Piroxlcam   Clmetidine heparin Plaquenil   Cllnoril Hyco-pap Ponstel   Clofibrate Hydroxy chloroquine Propoxyphen         Before stopping any of these medications, be sure to consult the physician who ordered them.  Some, such as Coumadin (Warfarin) are ordered to prevent or treat serious conditions such as "deep thrombosis", "pumonary embolisms", and other heart problems.  The amount of time that you may need off of the medication may also vary with the medication and the reason for which you were taking it.  If you are taking any of these medications, please make sure you notify your pain physician before you undergo any  procedures.         Epidural Steroid Injection Patient Information  Description: The epidural space surrounds the nerves as they exit the spinal cord.  In some patients, the nerves can be compressed and inflamed by a bulging disc or a tight spinal canal (spinal stenosis).  By injecting steroids into the epidural space, we can bring irritated nerves into direct contact with a potentially helpful medication.  These steroids act directly on the irritated nerves and can reduce swelling and inflammation which often leads to decreased pain.  Epidural steroids may be injected anywhere along the spine and from the neck to the low back depending upon the location of your pain.   After numbing the skin with local anesthetic (like Novocaine), a small needle is passed  into the epidural space slowly.  You may experience a sensation of pressure while this is being done.  The entire block usually last less than 10 minutes.  Conditions which may be treated by epidural steroids:   Low back and leg pain  Neck and arm pain  Spinal stenosis  Post-laminectomy syndrome  Herpes zoster (shingles) pain  Pain from compression fractures  Preparation for the injection:  1. Do not eat any solid food or dairy products within 8 hours of your appointment.  2. You may drink clear liquids up to 3 hours before appointment.  Clear liquids include water, black coffee, juice or soda.  No milk or cream please. 3. You may take your regular medication, including pain medications, with a sip of water before your appointment  Diabetics should hold regular insulin (if taken separately) and take 1/2 normal NPH dos the morning of the procedure.  Carry some sugar containing items with you to your appointment. 4. A driver must accompany you and be prepared to drive you home after your procedure.  5. Bring all your current medications with your. 6. An IV may be inserted and sedation may be given at the discretion of the  physician.   7. A blood pressure cuff, EKG and other monitors will often be applied during the procedure.  Some patients may need to have extra oxygen administered for a short period. 8. You will be asked to provide medical information, including your allergies, prior to the procedure.  We must know immediately if you are taking blood thinners (like Coumadin/Warfarin)  Or if you are allergic to IV iodine contrast (dye). We must know if you could possible be pregnant.  Possible side-effects:  Bleeding from needle site  Infection (rare, may require surgery)  Nerve injury (rare)  Numbness & tingling (temporary)  Difficulty urinating (rare, temporary)  Spinal headache ( a headache worse with upright posture)  Light -headedness (temporary)  Pain at injection site (several days)  Decreased blood pressure (temporary)  Weakness in arm/leg (temporary)  Pressure sensation in back/neck (temporary)  Call if you experience:  Fever/chills associated with headache or increased back/neck pain.  Headache worsened by an upright position.  New onset weakness or numbness of an extremity below the injection site  Hives or difficulty breathing (go to the emergency room)  Inflammation or drainage at the infection site  Severe back/neck pain  Any new symptoms which are concerning to you  Please note:  Although the local anesthetic injected can often make your back or neck feel good for several hours after the injection, the pain will likely return.  It takes 3-7 days for steroids to work in the epidural space.  You may not notice any pain relief for at least that one week.  If effective, we will often do a series of three injections spaced 3-6 weeks apart to maximally decrease your pain.  After the initial series, we generally will wait several months before considering a repeat injection of the same type.  If you have any questions, please call [phone] [facility]____________________________________________________________________________________________  Genicular Nerve Block  What is a genicular nerve block? A genicular nerve block is the injection of a local anesthetic to block the nerves that transmits pain from the knee.  What is the purpose of a facet nerve block? A genicular nerve block is a diagnostic procedure to determine if the pathologic changes (i.e. arthritis, meniscal tears, etc) and inflammation within the knee joint  is the source of your knee pain. It also confirms that the knee pain will respond well to the actual treatment procedure. If a genicular nerve block works, it will give you relief for several hours. After that, the pain is expected to return to normal. This test is always performed twice (usually a week or two apart) because two successful tests are required to move onto treatment. If both diagnostic tests are positive, then we schedule a treatment called radiofrequency (RF) ablation. In this procedure, the same nerves are cauterized, which typically leads to pain relief for 4 -18 months. If this process works well for one knee, it can be performed on the other knee if needed.  How is the procedure performed? You will be placed on the procedure table. The injection site is sterilized with either iodine or chlorhexadine. The site to be injected is numbed with a local anesthetic, and a needle is directed to the target area. X-ray guidance is used to ensure proper placement and positioning of the needle. When the needle is properly positioned near the genicular nerve, local anesthetic is injected to numb that nerve. This will be repeated at multiple sites around the knee to block all genicular nerves.  Will the procedure be painful? The injection can be painful and we therefore provide the option of receiving IV sedation. IV sedation, combined with local anesthetic, can make the injection nearly pain free. It allows  you to remain very still during the procedure, which can also make the injection easier, faster, and more successful. If you decide to have IV sedation, you must have a driver to get you home safely afterwards. In addition, you cannot have anything to eat or drink within 8 hours of your appointment (clear liquids are allowed until 3 hours before the procedure). If you take medications for diabetes, these medications may need to be adjusted the morning of the procedure. Your primary care physician can help you with this adjustment.  What are the discharge instructions? If you received IV sedation do not drive or operate machinery for at least 24 hours after the procedure. You may return to work the next day following your procedure. You may resume your normal diet immediately. Do not engage in any strenuous activity for 24 hours. You should, however, engage in moderate activity that typically causes your ususal pain. If the block works, those activities should not be painful for several hours after the injection. Do not take a bath, swim, or use a hot tub for 24 hours (you may take a shower). Call the office if you have any of the following: severe pain afterwards (different than your usual symptoms), redness/swelling/discharge at the injection site(s), fevers/chills, difficulty with bowel or bladder functions.  What are the risks and side effects? The complication rate for this procedure is very low. Whenever a needle enters the skin, bleeding or infection can occur. Some other serious but extremely rare risks include paralysis and death. You may have an allergic reaction to any of the medications used. If you have a known allergy to any medications, especially local anesthetics, notify our staff before the procedure takes place. You may experience any of the following side effects up to 4 - 6 hours after the procedure:  Leg muscle weakness or numbness may occur due to the local anesthetic affecting the  nerves that control your legs (this is a temporary affect and it is not paralysis). If you have any leg weakness or numbness, walk only with assistance  in order to prevent falls and injury. Your leg strength will return slowly and completely.  Dizziness may occur due to a decrease in your blood pressure. If this occurs, remain in a seated or lying position. Gradually sit up, and then stand after at least 10 minutes of sitting.  Mild headaches may occur. Drink fluids and take pain medications if needed. If the headaches persist or become severe, call the office.  Mild discomfort at the injection site can occur. This typically lasts for a few hours but can persist for a couple days. If this occurs, take anti-inflammatories or pain medications, apply ice to the area the day of the procedure. If it persists, apply moist heat in the day(s) following.  The side effects listed above can be normal. They are not dangerous and will resolve on their own. If, however, you experience any of the following, a complication may have occurred and you should either contact your doctor. If he is not readily available, then you should proceed to the closest urgent care center for evaluation:  Severe or progressive pain at the injection site(s)  Arm or leg weakness that progressively worsens or persists for longer than 8 hours  Severe or progressive redness, swelling, or discharge from the injections site(s)  Fevers, chills, nausea, or vomiting  Bowel or bladder dysfunction (i.e. inability to urinate or pass stool or difficulty controlling either)  How long does it take for the procedure to work? You should feel relief from your usual pain within the first hour. Again, this is only expected to last for several hours, at the most. Remember, you may be sore in the middle part of your back from the needles, and you must distinguish this from your usual  pain. ____________________________________________________________________________________________

## 2017-11-14 NOTE — Progress Notes (Signed)
Subjective:  Patient ID: Stephen Wise, male    DOB: 06-Oct-1970  Age: 47 y.o. MRN: [id number]  CC: Back Pain (lower) and Hand Pain (right)   Procedure: None  HPI Zeddie Njie presents for reevaluation.  He was last seen 2 months ago and the quality characteristic and distribution of his low back pain and buttock pain are unchanged.  He is taking his medications as prescribed but continues to have breakthrough's pain despite this.  Based on his narcotic assessment sheet he continues to derive good functional lifestyle improvement with his medications with no side effects.  He denies any sedation or nausea vomiting with the medications no constipation either.  His strength in lower extremities is well-preserved without change noted.  Otherwise he is in his usual state of health.  His bowel bladder function has been stable as well with no problems noted.  Outpatient Medications Prior to Visit  Medication Sig Dispense Refill  . amphetamine-dextroamphetamine (ADDERALL) 10 MG tablet Take 1 tablet (10 mg total) by mouth 2 (two) times daily. 60 tablet 0  . Aspirin-Acetaminophen-Caffeine (EXCEDRIN MIGRAINE PO) Take by mouth.    . butalbital-acetaminophen-caffeine (FIORICET WITH CODEINE) 50-325-40-30 MG capsule Take 1 capsule by mouth every 4 (four) hours. Taking too much of this will make your headaches worse- please limit it 60 capsule 0  . esomeprazole (NEXIUM) 40 MG capsule Take 40 mg by mouth.    . mesalamine (PENTASA) 500 MG CR capsule Take 1,000 mg by mouth 3 (three) times daily as needed.     . propranolol ER (INDERAL LA) 80 MG 24 hr capsule Take 1 capsule (80 mg total) by mouth daily. 30 capsule 3  . SUMAtriptan (IMITREX) 100 MG tablet Take 1 tablet (100 mg total) by mouth every 2 (two) hours as needed for migraine. May repeat in 2 hours if headache persists or recurs. 10 tablet 12  . HYDROcodone-acetaminophen (NORCO) 7.5-325 MG tablet Take 1 tablet by mouth every 6 (six) hours as needed for moderate  pain or severe pain. 75 tablet 0   No facility-administered medications prior to visit.     Review of Systems CNS: No confusion or sedation Cardiac: No angina or palpitations GI: No abdominal pain or constipation Constitutional: No nausea vomiting fevers or chills  Objective:  BP 131/84   Pulse 84   Temp 98.7 F (37.1 C) (Oral)   Resp 16   Ht 5' 10"  (1.778 m)   Wt 200 lb (90.7 kg)   SpO2 98%   BMI 28.70 kg/m    BP Readings from Last 3 Encounters:  11/13/17 131/84  09/07/17 137/86  08/29/17 129/83     Wt Readings from Last 3 Encounters:  11/13/17 200 lb (90.7 kg)  09/07/17 195 lb (88.5 kg)  08/29/17 202 lb 6 oz (91.8 kg)     Physical Exam Pt is alert and oriented PERRL EOMI HEART IS RRR no murmur or rub LCTA no wheezing or rales MUSCULOSKELETAL reveals some paraspinous muscle tenderness but no overt trigger points.  With the patient in the standing position he does have pain with extension at the low back reproducing his primary pain complaint and this is exacerbated by left and right lateral rotation.  His lower extremity muscle tone and bulk is good.  He ambulates well.  Labs  No results found for: HGBA1C No results found for: GLUF, MICROALBUR, LDLCALC, CREATININE  -------------------------------------------------------------------------------------------------------------------- No results found for: WBC, HGB, HCT, PLT, GLUCOSE, CHOL, TRIG, HDL, LDLDIRECT, LDLCALC, ALT, AST, NA, K,  CL, CREATININE, BUN, CO2, TSH, PSA, INR, GLUF, HGBA1C, MICROALBUR  --------------------------------------------------------------------------------------------------------------------- Mr Brain Wo Contrast  Result Date: 06/30/2017 CLINICAL DATA:  Chronic migraines, worsening in intensity. EXAM: MRI HEAD WITHOUT CONTRAST TECHNIQUE: Multiplanar, multiecho pulse sequences of the brain and surrounding structures were obtained without intravenous contrast. COMPARISON:  None. FINDINGS:  Brain: No infarction, hemorrhage, hydrocephalus, extra-axial collection or mass lesion. Single FLAIR hyperintense focus in the left cerebral white matter, allowable for age and nonspecific. Vascular: Major vessels are patent Skull and upper cervical spine: No evidence of bone lesion. Sinuses/Orbits: No sinusitis or orbital finding to explain headache. IMPRESSION: Negative brain MRI.  No explanation for headache. Electronically Signed   By: Monte Fantasia M.D.   On: 06/30/2017 09:02     Assessment & Plan:   Talal was seen today for back pain and hand pain.  Diagnoses and all orders for this visit:  Neck pain  Long term current use of opiate analgesic  Chronic pain syndrome  Chronic left-sided low back pain without sciatica -     Lumbar Epidural Injection; Future  Facet arthritis of lumbar region [facility]) -     Lumbar Epidural Injection; Future  Disc disease, degenerative, lumbar or lumbosacral -     Lumbar Epidural Injection; Future  Spondylolisthesis of lumbar region -     MR LUMBAR SPINE WO CONTRAST; Future  Other orders -     Discontinue: HYDROcodone-acetaminophen (NORCO) 7.5-325 MG tablet; Take 1 tablet by mouth 3 (three) times daily. -     HYDROcodone-acetaminophen (NORCO) 7.5-325 MG tablet; Take 1 tablet by mouth 3 (three) times daily.        ----------------------------------------------------------------------------------------------------------------------  Problem List Items Addressed This Visit      Unprioritized   Chronic pain syndrome (Chronic)   Disc disease, degenerative, lumbar or lumbosacral (Chronic)   Relevant Medications   HYDROcodone-acetaminophen (NORCO) 7.5-325 MG tablet   Other Relevant Orders   Lumbar Epidural Injection   Long term current use of opiate analgesic (Chronic)   Neck pain - Primary (Chronic)    Other Visit Diagnoses    Chronic left-sided low back pain without sciatica       Relevant Medications   HYDROcodone-acetaminophen (NORCO)  7.5-325 MG tablet   Other Relevant Orders   Lumbar Epidural Injection   Facet arthritis of lumbar region ([place])       Relevant Medications   HYDROcodone-acetaminophen (NORCO) 7.5-325 MG tablet   Other Relevant Orders   Lumbar Epidural Injection   Spondylolisthesis of lumbar region       Relevant Orders   MR LUMBAR SPINE WO CONTRAST        ----------------------------------------------------------------------------------------------------------------------  1. Neck pain Continue with stretching strengthening exercises and orthotic pillow as discussed  2. Long term current use of opiate analgesic Going to increase his hydrocodone to 3 times a day dosing.  I once again reviewed the risks and benefits of chronic opioid administration however he continues to fail conservative therapy.  At this point the majority of his pain is back related and anti-inflammatory and physical therapy modalities have not given him any significant relief.  Is been present for an extended period of time.  When increase him to 3 times a day dosing and also request an MRI for further analysis for low back pathology.  3. Chronic pain syndrome As above.  We have reviewed the [facility] practitioner database information and it is appropriate.  4. Chronic left-sided low back pain without sciatica Going to schedule him for a  lumbar epidural steroid at his next visit.  The risks and benefits of been fully reviewed.  This will be to treat both the degenerative disc and facet clinical pathology. - Lumbar Epidural Injection; Future  5. Facet arthritis of lumbar region [facility]) As above - Lumbar Epidural Injection; Future  6. Disc disease, degenerative, lumbar or lumbosacral As above - Lumbar Epidural Injection; Future  7. Spondylolisthesis of lumbar region Above - MR LUMBAR SPINE WO CONTRAST;  Future    ----------------------------------------------------------------------------------------------------------------------  I am having [name] maintain his mesalamine, Aspirin-Acetaminophen-Caffeine (EXCEDRIN MIGRAINE PO), propranolol ER, SUMAtriptan, esomeprazole, amphetamine-dextroamphetamine, butalbital-acetaminophen-caffeine, and HYDROcodone-acetaminophen.   Meds ordered this encounter  Medications  . DISCONTD: HYDROcodone-acetaminophen (NORCO) 7.5-325 MG tablet    Sig: Take 1 tablet by mouth 3 (three) times daily.    Dispense:  75 tablet    Refill:  0    Do not fill until 14103013  . HYDROcodone-acetaminophen (NORCO) 7.5-325 MG tablet    Sig: Take 1 tablet by mouth 3 (three) times daily.    Dispense:  75 tablet    Refill:  0    Do not fill until 14388875   Patient's Medications  New Prescriptions   No medications on file  Previous Medications   AMPHETAMINE-DEXTROAMPHETAMINE (ADDERALL) 10 MG TABLET    Take 1 tablet (10 mg total) by mouth 2 (two) times daily.   ASPIRIN-ACETAMINOPHEN-CAFFEINE (EXCEDRIN MIGRAINE PO)    Take by mouth.   BUTALBITAL-ACETAMINOPHEN-CAFFEINE (FIORICET WITH CODEINE) 50-325-40-30 MG CAPSULE    Take 1 capsule by mouth every 4 (four) hours. Taking too much of this will make your headaches worse- please limit it   ESOMEPRAZOLE (NEXIUM) 40 MG CAPSULE    Take 40 mg by mouth.   MESALAMINE (PENTASA) 500 MG CR CAPSULE    Take 1,000 mg by mouth 3 (three) times daily as needed.    PROPRANOLOL ER (INDERAL LA) 80 MG 24 HR CAPSULE    Take 1 capsule (80 mg total) by mouth daily.   SUMATRIPTAN (IMITREX) 100 MG TABLET    Take 1 tablet (100 mg total) by mouth every 2 (two) hours as needed for migraine. May repeat in 2 hours if headache persists or recurs.  Modified Medications   Modified Medication Previous Medication   HYDROCODONE-ACETAMINOPHEN (NORCO) 7.5-325 MG TABLET HYDROcodone-acetaminophen (NORCO) 7.5-325 MG tablet      Take 1 tablet by mouth 3 (three)  times daily.    Take 1 tablet by mouth every 6 (six) hours as needed for moderate pain or severe pain.  Discontinued Medications   No medications on file   ----------------------------------------------------------------------------------------------------------------------  Follow-up: Return in about 2 months (around 01/11/2018) for evaluation, procedure.    Molli Barrows, MD

## 2017-11-20 ENCOUNTER — Telehealth: Payer: Self-pay | Admitting: *Deleted

## 2017-11-24 ENCOUNTER — Ambulatory Visit: Payer: Managed Care, Other (non HMO)

## 2017-11-29 ENCOUNTER — Ambulatory Visit: Payer: Managed Care, Other (non HMO) | Admitting: Family Medicine

## 2017-11-29 ENCOUNTER — Telehealth: Payer: Self-pay | Admitting: Family Medicine

## 2017-11-29 NOTE — Telephone Encounter (Signed)
Copied from [place] [id number]. Topic: Quick Communication - See Telephone Encounter >> Nov 29, 2017  1:55 PM Conception Chancy, NT wrote: CRM for notification. See Telephone encounter for:  11/29/17.  Patient is needing a refill on butalbital-acetaminophen-caffeine (FIORICET WITH CODEINE) . If script needs to be picked up please contact when ready.  [id number] [place] [address]

## 2017-11-30 MED ORDER — BUTALBITAL-APAP-CAFF-COD 50-325-40-30 MG PO CAPS: 1.0000 | ORAL_CAPSULE | ORAL | 0 refills | Status: DC

## 2017-11-30 NOTE — Telephone Encounter (Signed)
Fiorcet with codeine refill LOV: 08/29/17 PCP: [place] [address] [place]

## 2017-12-01 ENCOUNTER — Ambulatory Visit: Admission: RE | Admit: 2017-12-01 | Payer: Managed Care, Other (non HMO) | Source: Ambulatory Visit

## 2017-12-01 ENCOUNTER — Other Ambulatory Visit: Payer: Self-pay | Admitting: Unknown Physician Specialty

## 2017-12-01 MED ORDER — BUTALBITAL-APAP-CAFF-COD 50-325-40-30 MG PO CAPS: 1.0000 | ORAL_CAPSULE | ORAL | 0 refills | Status: DC

## 2017-12-01 NOTE — Telephone Encounter (Signed)
Patient notified script is ready

## 2017-12-01 NOTE — Telephone Encounter (Signed)
Wife called to follow up on this Rx request.  She states no one has called, but it was printed yesterday according to the chart. Phone: [phone]

## 2017-12-01 NOTE — Telephone Encounter (Signed)
Patient calling about his RX and I spoke with the [facility] she advised me to tell the patient that the Rx has been written but Orene Desanctis forgot to sign RX and that she had left for the day FC will try and get [place] to sign another RX I advised pt that someone will give him a call when RX is ready

## 2017-12-05 ENCOUNTER — Encounter: Payer: Self-pay | Admitting: Family Medicine

## 2017-12-05 ENCOUNTER — Ambulatory Visit: Payer: Managed Care, Other (non HMO) | Admitting: Family Medicine

## 2017-12-05 VITALS — BP 127/85 | HR 79 | Temp 98.6°F | Wt 205.2 lb

## 2017-12-05 DIAGNOSIS — G43009 Migraine without aura, not intractable, without status migrainosus: Secondary | ICD-10-CM

## 2017-12-05 DIAGNOSIS — F909 Attention-deficit hyperactivity disorder, unspecified type: Secondary | ICD-10-CM | POA: Diagnosis not present

## 2017-12-05 MED ORDER — AMPHETAMINE-DEXTROAMPHET ER 20 MG PO CP24: 20.0000 mg | ORAL_CAPSULE | ORAL | 0 refills | Status: DC

## 2017-12-05 MED ORDER — AMPHETAMINE-DEXTROAMPHETAMINE 10 MG PO TABS: ORAL_TABLET | ORAL | 0 refills | Status: DC

## 2017-12-05 NOTE — Progress Notes (Signed)
BP 127/85 (BP Location: Left Arm, Patient Position: Sitting, Cuff Size: Normal)   Pulse 79   Temp 98.6 F (37 C)   Wt 205 lb 4 oz (93.1 kg)   SpO2 100%   BMI 29.45 kg/m    Subjective:    Patient ID: Stephen Wise, male    DOB: [birth year], 47 y.o.   MRN: [id number]  HPI: Stephen Wise is a 47 y.o. male  Chief Complaint  Patient presents with  . ADHD   Headaches have been bad this month. They seem to be fluctuating- sometimes not bad, sometimes really bad. Fioricet still helps. Seeing neurology in April. Imitrex seems to work well for him. Thinks he is having 2-3 migraines a week, having almost daily headaches.  ADHD FOLLOW UP- having a lot of trouble focusing at the end of the day ADHD status: uncontrolled Satisfied with current therapy: no Medication compliance:  excellent compliance Controlled substance contract: yes Previous psychiatry evaluation: yes Previous medications: yes    Taking meds on weekends/vacations: yes Work/school performance:  good Difficulty sustaining attention/completing tasks: yes Distracted by extraneous stimuli: yes Does not listen when spoken to: yes  Fidgets with hands or feet: no Unable to stay in seat: no Blurts out/interrupts others: no ADHD Medication Side Effects: no    Decreased appetite: no    Headache: no    Sleeping disturbance pattern: no    Irritability: no    Rebound effects (worse than baseline) off medication: no    Anxiousness: no    Dizziness: no    Tics: no  Relevant past medical, surgical, family and social history reviewed and updated as indicated. Interim medical history since our last visit reviewed. Allergies and medications reviewed and updated.  Review of Systems  Constitutional: Negative.   HENT: Negative.   Respiratory: Negative.   Cardiovascular: Negative.   Psychiatric/Behavioral: Positive for decreased concentration. Negative for agitation, behavioral problems, confusion, dysphoric mood, hallucinations,  self-injury, sleep disturbance and suicidal ideas. The patient is not nervous/anxious and is not hyperactive.     Per HPI unless specifically indicated above     Objective:    BP 127/85 (BP Location: Left Arm, Patient Position: Sitting, Cuff Size: Normal)   Pulse 79   Temp 98.6 F (37 C)   Wt 205 lb 4 oz (93.1 kg)   SpO2 100%   BMI 29.45 kg/m   Wt Readings from Last 3 Encounters:  12/05/17 205 lb 4 oz (93.1 kg)  11/13/17 200 lb (90.7 kg)  09/07/17 195 lb (88.5 kg)    Physical Exam  Constitutional: He is oriented to person, place, and time. He appears well-developed and well-nourished. No distress.  HENT:  Head: Normocephalic and atraumatic.  Right Ear: Hearing normal.  Left Ear: Hearing normal.  Nose: Nose normal.  Eyes: Conjunctivae and lids are normal. Right eye exhibits no discharge. Left eye exhibits no discharge. No scleral icterus.  Cardiovascular: Normal rate, regular rhythm, normal heart sounds and intact distal pulses. Exam reveals no gallop and no friction rub.  No murmur heard. Pulmonary/Chest: Effort normal and breath sounds normal. No respiratory distress. He has no wheezes. He has no rales. He exhibits no tenderness.  Musculoskeletal: Normal range of motion.  Neurological: He is alert and oriented to person, place, and time.  Skin: Skin is warm, dry and intact. No rash noted. He is not diaphoretic. No erythema. No pallor.  Psychiatric: He has a normal mood and affect. His speech is normal and behavior is normal. Judgment  and thought content normal. Cognition and memory are normal.  Nursing note and vitals reviewed.   Results for orders placed or performed in visit on 03/08/17  Compliance Drug Analysis, Ur  Result Value Ref Range   Summary FINAL       Assessment & Plan:   Problem List Items Addressed This Visit      Cardiovascular and Mediastinum   Migraine without aura and without status migrainosus, not intractable    Not under great control. To see  neurology in about 2 months. Call with any concerns. Discussed risks of rebound on fiorect. Only 1 refill per month.         Other   Adult ADHD - Primary    Not under great control. Will change to ER adderall and increase to 54m daily and give 5-130mPRN afternoon on days when really needs it. Recheck 1 month.           Follow up plan: Return in about 4 weeks (around 01/02/2018) for Follow up ADD.

## 2017-12-05 NOTE — Assessment & Plan Note (Signed)
Not under great control. To see neurology in about 2 months. Call with any concerns. Discussed risks of rebound on fiorect. Only 1 refill per month.

## 2017-12-05 NOTE — Assessment & Plan Note (Signed)
Not under great control. Will change to ER adderall and increase to 4m daily and give 5-172mPRN afternoon on days when really needs it. Recheck 1 month.

## 2017-12-08 ENCOUNTER — Ambulatory Visit
Admission: RE | Admit: 2017-12-08 | Discharge: 2017-12-08 | Disposition: A | Discharge status: Home / Self Care | Payer: Managed Care, Other (non HMO) | Source: Ambulatory Visit | Attending: Anesthesiology | Admitting: Anesthesiology

## 2017-12-08 DIAGNOSIS — M4316 Spondylolisthesis, lumbar region: Secondary | ICD-10-CM | POA: Diagnosis not present

## 2017-12-08 DIAGNOSIS — M47816 Spondylosis without myelopathy or radiculopathy, lumbar region: Secondary | ICD-10-CM | POA: Diagnosis not present

## 2017-12-08 DIAGNOSIS — M5136 Other intervertebral disc degeneration, lumbar region: Secondary | ICD-10-CM | POA: Insufficient documentation

## 2017-12-19 ENCOUNTER — Ambulatory Visit: Payer: Managed Care, Other (non HMO) | Admitting: Anesthesiology

## 2017-12-28 ENCOUNTER — Telehealth: Payer: Self-pay | Admitting: Family Medicine

## 2017-12-28 ENCOUNTER — Other Ambulatory Visit: Payer: Self-pay | Admitting: *Deleted

## 2017-12-28 MED ORDER — BUTALBITAL-APAP-CAFF-COD 50-325-40-30 MG PO CAPS: 1.0000 | ORAL_CAPSULE | ORAL | 0 refills | Status: DC

## 2017-12-28 NOTE — Telephone Encounter (Addendum)
Will route to office for provider approva] fioricet with codeine 50-325 LOV: 12/15/17 PCP: Dr Park Liter [facility] [place]

## 2017-12-28 NOTE — Telephone Encounter (Signed)
Copied from [place] [id number]. Topic: Quick Communication - Rx Refill/Question >> Dec 28, 2017  2:22 PM Oliver Pila B wrote: Medication: butalbital-acetaminophen-caffeine (FIORICET WITH CODEINE) 50-325-40-30 MG capsule [[id number]]  Has the patient contacted their pharmacy? No. (Agent: If no, request that the patient contact the pharmacy for the refill.) Preferred Pharmacy (with phone number or street name[facility] [place]: Please be advised that RX refills may take up to 3 business days. We ask that you follow-up with your pharmacy.

## 2017-12-29 NOTE — Telephone Encounter (Signed)
Pt called to check the status of Rx, call pt to advise

## 2017-12-29 NOTE — Telephone Encounter (Signed)
Called patient to let him know the script was ready

## 2018-01-04 ENCOUNTER — Ambulatory Visit: Payer: Managed Care, Other (non HMO) | Admitting: Family Medicine

## 2018-01-04 ENCOUNTER — Encounter: Payer: Self-pay | Admitting: Family Medicine

## 2018-01-04 VITALS — BP 122/72 | HR 86 | Wt 200.1 lb

## 2018-01-04 DIAGNOSIS — F909 Attention-deficit hyperactivity disorder, unspecified type: Secondary | ICD-10-CM | POA: Diagnosis not present

## 2018-01-04 DIAGNOSIS — G43009 Migraine without aura, not intractable, without status migrainosus: Secondary | ICD-10-CM

## 2018-01-04 MED ORDER — NORTRIPTYLINE HCL 25 MG PO CAPS: 25.0000 mg | ORAL_CAPSULE | Freq: Every day | ORAL | 3 refills | Status: DC

## 2018-01-04 MED ORDER — AMPHETAMINE-DEXTROAMPHET ER 20 MG PO CP24: 20.0000 mg | ORAL_CAPSULE | ORAL | 0 refills | Status: DC

## 2018-01-04 MED ORDER — AMPHETAMINE-DEXTROAMPHETAMINE 10 MG PO TABS: ORAL_TABLET | ORAL | 0 refills | Status: DC

## 2018-01-04 NOTE — Assessment & Plan Note (Signed)
Wasn't sure if it has been helping. Possibly happening due to increased stress. Will continue current regimen and recheck in 1 month. Call with any concerns.

## 2018-01-04 NOTE — Assessment & Plan Note (Signed)
Stable, but still getting 2-3 migraines a week. Will start nortriptyline. Call with any concerns. Recheck 1 month.

## 2018-01-04 NOTE — Progress Notes (Signed)
BP 122/72 (BP Location: Left Arm, Cuff Size: Normal)   Pulse 86   Wt 200 lb 2 oz (90.8 kg)   SpO2 100%   BMI 28.71 kg/m    Subjective:    Patient ID: Stephen Wise, male    DOB: [birth year], 47 y.o.   MRN: [id number]  HPI: Stephen Wise is a 47 y.o. male  Chief Complaint  Patient presents with  . Follow-up    ADHD, migraines   ADHD FOLLOW UP- didn't feel like the medicine was working as well. He notes that it almost made him tired. Still feels like it was wearing off around lunch time about 50% of the time, thinks that this seems to be when he's having a stressful day ADHD status: worse Satisfied with current therapy: no Medication compliance:  good compliance Controlled substance contract: yes Previous psychiatry evaluation: yes Previous medications: no adderall and adderall XR   Taking meds on weekends/vacations: yes Work/school performance:  good Difficulty sustaining attention/completing tasks: yes Distracted by extraneous stimuli: yes Does not listen when spoken to: no  Fidgets with hands or feet: no Unable to stay in seat: no Blurts out/interrupts others: no ADHD Medication Side Effects: no    Decreased appetite: no    Headache: no    Sleeping disturbance pattern: no    Irritability: no    Rebound effects (worse than baseline) off medication: no    Anxiousness: yes    Dizziness: no    Tics: no  Notes that his headaches are some days worse than others. He notes that the past couple of weeks they were like the headaches in the fall. Has been having more headaches with stress and light sensitivity. Having more tension headaches going into migraines. Now having tension headaches about 4 days a week. Whenever his eyes have issues, he gets headaches. He thinks that he's getting a migraine 2-3x a week. Starts with a imitrex, if that doesn't help, he will take the fiorcet. He is taking the fiorcet a couple of times a week. Due to see neurology in April. Has failed propranolol and  cymbalta.  No other concerns or complaints at this time.   Relevant past medical, surgical, family and social history reviewed and updated as indicated. Interim medical history since our last visit reviewed. Allergies and medications reviewed and updated.  Review of Systems  Constitutional: Negative.   Respiratory: Negative.   Cardiovascular: Negative.   Neurological: Positive for headaches. Negative for dizziness, tremors, seizures, syncope, facial asymmetry, speech difficulty, weakness, light-headedness and numbness.  Hematological: Negative.   Psychiatric/Behavioral: Negative.     Per HPI unless specifically indicated above     Objective:    BP 122/72 (BP Location: Left Arm, Cuff Size: Normal)   Pulse 86   Wt 200 lb 2 oz (90.8 kg)   SpO2 100%   BMI 28.71 kg/m   Wt Readings from Last 3 Encounters:  01/04/18 200 lb 2 oz (90.8 kg)  12/05/17 205 lb 4 oz (93.1 kg)  11/13/17 200 lb (90.7 kg)    Physical Exam  Constitutional: He is oriented to person, place, and time. He appears well-developed and well-nourished. No distress.  HENT:  Head: Normocephalic and atraumatic.  Right Ear: Hearing normal.  Left Ear: Hearing normal.  Nose: Nose normal.  Eyes: Conjunctivae and lids are normal. Right eye exhibits no discharge. Left eye exhibits no discharge. No scleral icterus.  Cardiovascular: Normal rate, regular rhythm, normal heart sounds and intact distal pulses. Exam reveals no  gallop and no friction rub.  No murmur heard. Pulmonary/Chest: Effort normal and breath sounds normal. No respiratory distress. He has no wheezes. He has no rales. He exhibits no tenderness.  Musculoskeletal: Normal range of motion.  Neurological: He is alert and oriented to person, place, and time.  Skin: Skin is warm, dry and intact. No rash noted. He is not diaphoretic. No erythema. No pallor.  Psychiatric: He has a normal mood and affect. His speech is normal and behavior is normal. Judgment and thought  content normal. Cognition and memory are normal.  Nursing note and vitals reviewed.   Results for orders placed or performed in visit on 03/08/17  Compliance Drug Analysis, Ur  Result Value Ref Range   Summary FINAL       Assessment & Plan:   Problem List Items Addressed This Visit      Cardiovascular and Mediastinum   Migraine without aura and without status migrainosus, not intractable    Stable, but still getting 2-3 migraines a week. Will start nortriptyline. Call with any concerns. Recheck 1 month.       Relevant Medications   nortriptyline (PAMELOR) 25 MG capsule     Other   Adult ADHD - Primary    Wasn't sure if it has been helping. Possibly happening due to increased stress. Will continue current regimen and recheck in 1 month. Call with any concerns.           Follow up plan: Return in about 1 month (around 02/01/2018) for Follow up.

## 2018-01-15 ENCOUNTER — Telehealth: Payer: Self-pay | Admitting: *Deleted

## 2018-01-16 NOTE — Telephone Encounter (Signed)
If there aren't any sooner appointments,  nursing cannot do anything.

## 2018-01-22 ENCOUNTER — Encounter: Payer: Self-pay | Admitting: Anesthesiology

## 2018-01-22 ENCOUNTER — Other Ambulatory Visit: Payer: Self-pay

## 2018-01-22 ENCOUNTER — Ambulatory Visit: Payer: Managed Care, Other (non HMO) | Attending: Anesthesiology | Admitting: Anesthesiology

## 2018-01-22 VITALS — BP 146/95 | HR 79 | Temp 98.2°F | Resp 16 | Ht 70.0 in | Wt 200.0 lb

## 2018-01-22 DIAGNOSIS — M47817 Spondylosis without myelopathy or radiculopathy, lumbosacral region: Secondary | ICD-10-CM | POA: Insufficient documentation

## 2018-01-22 DIAGNOSIS — M5432 Sciatica, left side: Secondary | ICD-10-CM | POA: Insufficient documentation

## 2018-01-22 DIAGNOSIS — Z79891 Long term (current) use of opiate analgesic: Secondary | ICD-10-CM | POA: Insufficient documentation

## 2018-01-22 DIAGNOSIS — Z79899 Other long term (current) drug therapy: Secondary | ICD-10-CM | POA: Insufficient documentation

## 2018-01-22 DIAGNOSIS — M545 Low back pain: Secondary | ICD-10-CM

## 2018-01-22 DIAGNOSIS — G8929 Other chronic pain: Secondary | ICD-10-CM | POA: Diagnosis not present

## 2018-01-22 DIAGNOSIS — M542 Cervicalgia: Secondary | ICD-10-CM | POA: Insufficient documentation

## 2018-01-22 DIAGNOSIS — M5126 Other intervertebral disc displacement, lumbar region: Secondary | ICD-10-CM | POA: Diagnosis not present

## 2018-01-22 DIAGNOSIS — G894 Chronic pain syndrome: Secondary | ICD-10-CM | POA: Diagnosis not present

## 2018-01-22 DIAGNOSIS — M47816 Spondylosis without myelopathy or radiculopathy, lumbar region: Secondary | ICD-10-CM

## 2018-01-22 MED ORDER — HYDROCODONE-ACETAMINOPHEN 7.5-325 MG PO TABS: 1.0000 | ORAL_TABLET | Freq: Three times a day (TID) | ORAL | 0 refills | Status: DC

## 2018-01-22 NOTE — Progress Notes (Signed)
Nursing Pain Medication Assessment:  Safety precautions to be maintained throughout the outpatient stay will include: orient to surroundings, keep bed in low position, maintain call bell within reach at all times, provide assistance with transfer out of bed and ambulation.  Medication Inspection Compliance: Pill count conducted under aseptic conditions, in front of the patient. Neither the pills nor the bottle was removed from the patient's sight at any time. Once count was completed pills were immediately returned to the patient in their original bottle.  Medication: Hydrocodone/APAP Pill/Patch Count: 0 of 75 pills remain Pill/Patch Appearance: Markings consistent with prescribed medication Bottle Appearance: Standard pharmacy container. Clearly labeled. Filled Date: 03 06/ 2019 Last Medication intake:  Ran out of medicine more than 48 hours ago

## 2018-01-22 NOTE — Patient Instructions (Signed)
You have been given 2 scripts for hydrocodone today.  You have been given pre procedure instructions. Preparing for Procedure with Sedation Instructions: . Oral Intake: Do not eat or drink anything for at least 8 hours prior to your procedure. . Transportation: Public transportation is not allowed. Bring an adult driver. The driver must be physically present in our waiting room before any procedure can be started. Marland Kitchen Physical Assistance: Bring an adult capable of physically assisting you, in the event you need help. . Blood Pressure Medicine: Take your blood pressure medicine with a sip of water the morning of the procedure. . Insulin: Take only  of your normal insulin dose. . Preventing infections: Shower with an antibacterial soap the morning of your procedure. . Build-up your immune system: Take 1000 mg of Vitamin C with every meal (3 times a day) the day prior to your procedure. . Pregnancy: If you are pregnant, call and cancel the procedure. . Sickness: If you have a cold, fever, or any active infections, call and cancel the procedure. . Arrival: You must be in the facility at least 30 minutes prior to your scheduled procedure. . Children: Do not bring children with you. . Dress appropriately: Bring dark clothing that you would not mind if they get stained. . Valuables: Do not bring any jewelry or valuables. Procedure appointments are reserved for interventional treatments only. Marland Kitchen No Prescription Refills. . No medication changes will be discussed during procedure appointments. No disability issues will be discussed.Preparing for your procedure (without sedation) Instructions: . Oral Intake: Do not eat or drink anything for at least 3 hours prior to your procedure. . Transportation: Unless otherwise stated by your physician, you may drive yourself after the procedure. . Blood Pressure Medicine: Take your blood pressure medicine with a sip of water the morning of the procedure. . Insulin:  Take only  of your normal insulin dose. . Preventing infections: Shower with an antibacterial soap the morning of your procedure. . Build-up your immune system: Take 1000 mg of Vitamin C with every meal (3 times a day) the day prior to your procedure. . Pregnancy: If you are pregnant, call and cancel the procedure. . Sickness: If you have a cold, fever, or any active infections, call and cancel the procedure. . Arrival: You must be in the facility at least 30 minutes prior to your scheduled procedure. . Children: Do not bring any children with you. . Dress appropriately: Bring dark clothing that you would not mind if they get stained. . Valuables: Do not bring any jewelry or valuables. Procedure appointments are reserved for interventional treatments only. Marland Kitchen No Prescription Refills. . No medication changes will be discussed during procedure appointments. No disability issues will be discussed.

## 2018-01-23 ENCOUNTER — Telehealth: Payer: Self-pay | Admitting: Family Medicine

## 2018-01-23 ENCOUNTER — Ambulatory Visit: Payer: Managed Care, Other (non HMO) | Admitting: Neurology

## 2018-01-23 ENCOUNTER — Encounter: Payer: Self-pay | Admitting: Neurology

## 2018-01-23 VITALS — BP 112/84 | HR 77 | Ht 70.0 in | Wt 201.0 lb

## 2018-01-23 DIAGNOSIS — G43109 Migraine with aura, not intractable, without status migrainosus: Secondary | ICD-10-CM | POA: Diagnosis not present

## 2018-01-23 DIAGNOSIS — G4440 Drug-induced headache, not elsewhere classified, not intractable: Secondary | ICD-10-CM | POA: Diagnosis not present

## 2018-01-23 NOTE — Telephone Encounter (Signed)
Copied from [place] [id number]. Topic: Quick Communication - Rx Refill/Question >> Jan 23, 2018  4:12 PM Margot Ables wrote: Medication: butalbital-acetaminophen-caffeine (FIORICET WITH CODEINE) [phone] MG capsule pt will be out Friday - they said they have to pick up the RX - please call when ready Has the patient contacted their pharmacy? No - controlled Preferred Pharmacy (with phone number or street name): [id number] [address] [phone](Phone) [phone](Fax)

## 2018-01-23 NOTE — Patient Instructions (Addendum)
Migraine Recommendations: 1.  Continue nortriptyline 2m at bedtime.  Call in 1 week with update and we can adjust dose if needed. 2.  Take sumatriptan 1063mat earliest onset of headache.  May repeat dose once in 2 hours if needed.  Do not exceed two tablets in 24 hours. 3.  Stop Fioricet and all over the counter pain relievers.  Only take sumatriptan limited to no more than 2 days out of the week to help reduce risk of rebound headaches. 4.  Be aware of common food triggers such as processed sweets, processed foods with nitrites (such as deli meat, hot dogs, sausages), foods with MSG, alcohol (such as wine), chocolate, certain cheeses, certain fruits (dried fruits, bananas, some citrus fruit), vinegar, diet soda. 4.  Avoid caffeine including soda 5.  Routine exercise 6.  Proper sleep hygiene 7.  Stay adequately hydrated with water 8.  Keep a headache diary. 9.  Maintain proper stress management. 10.  Do not skip meals. 11.  Consider supplements:  Magnesium citrate 40089mo 600m44mily, riboflavin 400mg17menzyme Q 10 100mg 109me times daily.  Check with your gastroenterologist first. 12.  Follow up in 3 months.   Recurrent Migraine Headache A migraine headache is very bad, throbbing pain that is usually on one side of your head. Recurrent migraines keep coming back (recurring). Talk with your doctor about what things may bring on (trigger) your migraine headaches. Follow these instructions at home: Medicines  Take over-the-counter and prescription medicines only as told by your doctor.  Do not drive or use heavy machinery while taking prescription pain medicine. Lifestyle  Do not use any products that contain nicotine or tobacco, such as cigarettes and e-cigarettes. If you need help quitting, ask your doctor.  Limit alcohol intake to no more than 1 drink a day for nonpregnant women and 2 drinks a day for men. One drink equals 12 oz of beer, 5 oz of wine, or 1 oz of hard liquor.  Get  7-9 hours of sleep each night.  Lessen any stress in your life. Ask your doctor about ways to lower your stress.  Stay at a healthy weight. Talk with your doctor if you need help losing weight.  Get regular exercise. General instructions  Keep a journal to find out if certain things bring on migraine headaches. For example, write down: ? What you eat and drink. ? How much sleep you get. ? Any change to your diet or medicines.  Lie down in a dark, quiet room when you have a migraine.  Try placing a cool towel over your head when you have a migraine.  Keep lights dim if bright lights bother you or make your migraines worse.  Keep all follow-up visits as told by your doctor. This is important. Contact a doctor if:  Medicine does not help your migraines.  Your pain keeps coming back.  You have a fever.  You have weight loss without trying. Get help right away if:  Your migraine becomes really bad and medicine does not help.  You have a stiff neck.  You have trouble seeing.  Your muscles are weak or you lose control of your muscles.  You lose your balance or have trouble walking.  You feel like you will pass out (faint) or you pass out.  You have really bad symptoms that are different than your first symptoms.  You start having sudden, very bad headaches that last for one second or less, like a thunderclap. Summary  A migraine headache is very bad, throbbing pain that is usually on one side of your head.  Talk with your doctor about what things may bring on (trigger) your migraine headaches.  Take over-the-counter and prescription medicines only as told by your doctor.  Lie down in a dark, quiet room when you have a migraine.  Keep a journal about what you eat and drink, how much sleep you get, and any changes to your medicines. This can help you find out if certain things make you have migraine headaches. This information is not intended to replace advice given  to you by your health care provider. Make sure you discuss any questions you have with your health care provider. Document Released: 06/28/2008 Document Revised: 08/12/2016 Document Reviewed: 08/12/2016 Elsevier Interactive Patient Education  2017 [employer].

## 2018-01-23 NOTE — Telephone Encounter (Signed)
Called patient, no answer, unable to leave a message, will try again.   

## 2018-01-23 NOTE — Telephone Encounter (Signed)
Note from Dr. Tomi Likens reviewed- to stop fiorect. He thinks it's causing rebound. Will need to stop fiorect. He should have plenty at home, as he told me last visit he only takes it 1-2 a week. But he should stop it all together like Dr. Tomi Likens recommended.

## 2018-01-23 NOTE — Progress Notes (Signed)
Subjective:  Patient ID: Stephen Wise, male    DOB: [birth year]  Age: 47 y.o. MRN: [id number]  CC: Back Pain (low)   Procedure: None  HPI Stephen Wise presents for reevaluation.  He was last seen a few months ago and the quality characteristic distribution of his low back pain and hip and leg pain remained stable in nature.  He does admit that he is getting more left lower leg calf cramping with occasional numbness and tingling depending on his level of activity and how long is been standing.  No weakness has been noted and his bowel bladder function is been stable in nature.  He has been taking hydrocodone for breakthrough pain using this 2 and sometimes 3 times a day without significant side effect.  In review of his narcotic assessment sheet he continues to derive good functional lifestyle improvement with the medications.  Otherwise no new changes are noted in the symptom quality characteristic distribution of his pain.  I once again reviewed his previous MRI findings.  Outpatient Medications Prior to Visit  Medication Sig Dispense Refill  . amphetamine-dextroamphetamine (ADDERALL XR) 20 MG 24 hr capsule Take 1 capsule (20 mg total) by mouth every morning. 30 capsule 0  . amphetamine-dextroamphetamine (ADDERALL) 10 MG tablet 5-31m qPM PRN on bad days 30 tablet 0  . butalbital-acetaminophen-caffeine (FIORICET WITH CODEINE) 50-325-40-30 MG capsule Take 1 capsule by mouth every 4 (four) hours. Taking too much of this will make your headaches worse- please limit it 60 capsule 0  . esomeprazole (NEXIUM) 40 MG capsule Take 40 mg by mouth.    . mesalamine (PENTASA) 500 MG CR capsule Take 1,000 mg by mouth 3 (three) times daily as needed.     . nortriptyline (PAMELOR) 25 MG capsule Take 1 capsule (25 mg total) by mouth at bedtime. 30 capsule 3  . SUMAtriptan (IMITREX) 100 MG tablet Take 1 tablet (100 mg total) by mouth every 2 (two) hours as needed for migraine. May repeat in 2 hours if headache  persists or recurs. 10 tablet 12  . HYDROcodone-acetaminophen (NORCO) 7.5-325 MG tablet Take 1 tablet by mouth 3 (three) times daily. 75 tablet 0   No facility-administered medications prior to visit.     Review of Systems CNS: No confusion or sedation Cardiac: No angina or palpitations GI: No abdominal pain or constipation Constitutional: No nausea vomiting fevers or chills  Objective:  BP (!) 146/95   Pulse 79   Temp 98.2 F (36.8 C)   Resp 16   Ht 5' 10"  (1.778 m)   Wt 200 lb (90.7 kg)   SpO2 99%   BMI 28.70 kg/m    BP Readings from Last 3 Encounters:  01/22/18 (!) 146/95  01/04/18 122/72  12/05/17 127/85     Wt Readings from Last 3 Encounters:  01/22/18 200 lb (90.7 kg)  01/04/18 200 lb 2 oz (90.8 kg)  12/05/17 205 lb 4 oz (93.1 kg)     Physical Exam Pt is alert and oriented PERRL EOMI HEART IS RRR no murmur or rub LCTA no wheezing or rales MUSCULOSKELETAL reveals a positive straight leg raise on the left side.  Negative on the right.  He has no significant paraspinous muscle tenderness.  He is ambulating with a nonantalgic gait and his muscle tone and bulk is at baseline.  Labs  No results found for: HGBA1C No results found for: GLUF, MICROALBUR, LDLCALC, CREATININE  -------------------------------------------------------------------------------------------------------------------- No results found for: WBC, HGB, HCT, PLT, GLUCOSE, CHOL, TRIG,  HDL, LDLDIRECT, LDLCALC, ALT, AST, NA, K, CL, CREATININE, BUN, CO2, TSH, PSA, INR, GLUF, HGBA1C, MICROALBUR  --------------------------------------------------------------------------------------------------------------------- Stephen Wise Wo Contrast  Result Date: 12/08/2017 CLINICAL DATA:  Low back pain and bilateral leg pain. EXAM: MRI LUMBAR Wise WITHOUT CONTRAST TECHNIQUE: Multiplanar, multisequence Stephen imaging of the lumbar Wise was performed. No intravenous contrast was administered. COMPARISON:   Radiographs dated 03/11/2017 FINDINGS: Segmentation:  Standard. Alignment:  Physiologic. Vertebrae:  No fracture, evidence of discitis, or bone lesion. Conus medullaris and cauda equina: Conus extends to the L1-2 level. Conus and cauda equina appear normal. Paraspinal and other soft tissues: Negative. Disc levels: L1-2: Normal. L2-3: Tiny central annular fissure and tiny disc bulge slightly asymmetric to the left with no neural impingement. L3-4: Small broad-based disc bulge with no neural impingement. Small annular fissure centrally. L4-5: Right far lateral annular fissure. No disc bulging or protrusion. Slight bilateral facet arthritis. No neural impingement. L5-S1: Small broad-based disc bulge with no neural impingement. IMPRESSION: 1. 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 2. No spinal or foraminal stenosis. 3. Minimal facet arthritis at L4-5. Electronically Signed   By: Lorriane Shire M.D.   On: 12/08/2017 09:57     Assessment & Plan:   Stephen Wise was seen today for back pain.  Diagnoses and all orders for this visit:  Neck pain  Long term current use of opiate analgesic  Chronic pain syndrome  Chronic left-sided low back pain without sciatica  Facet arthritis of lumbar region  Lumbosacral spondylosis without myelopathy -     Cancel: Lumbar Epidural Injection; Future -     Lumbar Epidural Injection; Future  Sciatica of left side -     Lumbar Epidural Injection; Future  Other orders -     Discontinue: HYDROcodone-acetaminophen (NORCO) 7.5-325 MG tablet; Take 1 tablet by mouth 3 (three) times daily. -     HYDROcodone-acetaminophen (NORCO) 7.5-325 MG tablet; Take 1 tablet by mouth 3 (three) times daily.        ----------------------------------------------------------------------------------------------------------------------  Problem List Items Addressed This Visit      Unprioritized   Chronic pain syndrome  (Chronic)   Long term current use of opiate analgesic (Chronic)   Neck pain - Primary (Chronic)    Other Visit Diagnoses    Chronic left-sided low back pain without sciatica       Relevant Medications   HYDROcodone-acetaminophen (NORCO) 7.5-325 MG tablet   Facet arthritis of lumbar region       Relevant Medications   HYDROcodone-acetaminophen (NORCO) 7.5-325 MG tablet   Lumbosacral spondylosis without myelopathy       Relevant Medications   HYDROcodone-acetaminophen (NORCO) 7.5-325 MG tablet   Other Relevant Orders   Lumbar Epidural Injection   Sciatica of left side       Relevant Orders   Lumbar Epidural Injection        ----------------------------------------------------------------------------------------------------------------------  1. Neck pain Continue with stretching strengthening exercises as previously reviewed  2. Long term current use of opiate analgesic I am going to reduce hydrocodone to twice daily dosing for 60 tablets with this next refill and then to twice daily dosing on his following refill with 45 tablets.  I want him to continue with his core stretching strengthening exercises.  Hopefully we can continue to wean his opioid use have encouraged him to continue with intermittent anti-inflammatory medications.  I do want him to follow-up and will request for an epidural steroid injection and I have  gone over the risk benefits of this procedure with him hopefully this can help with some of the sciatica-like symptoms that he is experiencing.  3. Chronic pain syndrome As above.  We have gone over the [facility] practitioner database information and it is appropriate.  4. Chronic left-sided low back pain without sciatica As above  5. Facet arthritis of lumbar region Continue core stretching strengthening  6. Lumbosacral spondylosis without myelopathy As above.  This at his next return appointment in approximately 3 to 4 weeks. - Lumbar Epidural  Injection; Future  7. Sciatica of left side As above. - Lumbar Epidural Injection; Future    ----------------------------------------------------------------------------------------------------------------------  I am having [name] maintain his mesalamine, SUMAtriptan, esomeprazole, butalbital-acetaminophen-caffeine, amphetamine-dextroamphetamine, amphetamine-dextroamphetamine, nortriptyline, and HYDROcodone-acetaminophen.   Meds ordered this encounter  Medications  . DISCONTD: HYDROcodone-acetaminophen (NORCO) 7.5-325 MG tablet    Sig: Take 1 tablet by mouth 3 (three) times daily.    Dispense:  60 tablet    Refill:  0    Do not fill until [id number]  . HYDROcodone-acetaminophen (NORCO) 7.5-325 MG tablet    Sig: Take 1 tablet by mouth 3 (three) times daily.    Dispense:  45 tablet    Refill:  0    Do not fill until [id number]   Patient's Medications  New Prescriptions   No medications on file  Previous Medications   AMPHETAMINE-DEXTROAMPHETAMINE (ADDERALL XR) 20 MG 24 HR CAPSULE    Take 1 capsule (20 mg total) by mouth every morning.   AMPHETAMINE-DEXTROAMPHETAMINE (ADDERALL) 10 MG TABLET    5-44m qPM PRN on bad days   BUTALBITAL-ACETAMINOPHEN-CAFFEINE (FIORICET WITH CODEINE) 50-325-40-30 MG CAPSULE    Take 1 capsule by mouth every 4 (four) hours. Taking too much of this will make your headaches worse- please limit it   ESOMEPRAZOLE (NEXIUM) 40 MG CAPSULE    Take 40 mg by mouth.   MESALAMINE (PENTASA) 500 MG CR CAPSULE    Take 1,000 mg by mouth 3 (three) times daily as needed.    NORTRIPTYLINE (PAMELOR) 25 MG CAPSULE    Take 1 capsule (25 mg total) by mouth at bedtime.   SUMATRIPTAN (IMITREX) 100 MG TABLET    Take 1 tablet (100 mg total) by mouth every 2 (two) hours as needed for migraine. May repeat in 2 hours if headache persists or recurs.  Modified Medications   Modified Medication Previous Medication   HYDROCODONE-ACETAMINOPHEN (NORCO) 7.5-325 MG TABLET  HYDROcodone-acetaminophen (NORCO) 7.5-325 MG tablet      Take 1 tablet by mouth 3 (three) times daily.    Take 1 tablet by mouth 3 (three) times daily.  Discontinued Medications   No medications on file   ----------------------------------------------------------------------------------------------------------------------  Follow-up: Return in about 1 month (around 02/21/2018) for evaluation, procedure.    JMolli Barrows MD

## 2018-01-23 NOTE — Progress Notes (Signed)
NEUROLOGY CONSULTATION NOTE  [name] MRN: [id number] DOB: 1970-10-23  Referring provider: Dr. Wynetta Emery Primary care provider: Dr.  Wynetta Emery  Reason for consult:  migraine  HISTORY OF PRESENT ILLNESS: [facility] is a 47 year old male with adult ADHD, Crohn's disease, chronic pain syndrome/low back pain and migraine who presents for migraine.  He is accompanied by his wife who supplements history.  Onset:  2013 Location:  Left temple but spreads to whole head Quality:  Stabbing, pounding Intensity:  severe.  He denies new headache, thunderclap headache or severe headache that wakes him from sleep. Aura:  Sometimes preceded with tunnel vision and flickering lights Prodrome:  no Postdrome:  no Associated symptoms:  Photophobia.  Rarely nausea.  He denies associated phonophobia or unilateral numbness or weakness. Duration:  Up to 3 hours or all day (brief with sumatriptan but often tries other medications first because he only gets 9 pills a month) Frequency:  1 to 2 days a week Frequency of abortive medication: every other day Triggers/exacerbating factors:  Sttress, light Relieving factors:  Ice pack, dark room Activity:  aggrvates He also has dull bifrontal non-throbbing headaches about once a week, treated with Advil.  So he has a total of about 16 headache days a month.  Rescue protocol:  First takes 2 Advil with Mt. Dew or Coke, then 39m sumatriptan then Fioricet Current NSAIDS:  Advil, Motrin Current analgesics:  Fioricet with Codeine Current triptans:  sumatriptan 1077m(takes 5012mCurrent anti-emetic:  no Current muscle relaxants:  no Current anti-anxiolytic:  no Current sleep aide:  no Current Antihypertensive medications:  no Current Antidepressant medications:  nortriptyline 70m80mrrent Anticonvulsant medications:  no Current Vitamins/Herbal/Supplements:  no Current Antihistamines/Decongestants:  no Other therapy:  no Other medication:  Adderall XR  MRI of  brain without contrast from 06/30/17 was personally reviewed and was negative.  Past NSAIDS:  no Past analgesics:  Excedrin Migraine (effective but aggravated Crohn's) Past abortive triptans:  no Past muscle relaxants:  no Past anti-emetic:  no Past antihypertensive medications:  propranolol ER Past antidepressant medications:  Cymbalta 30mg73mt anticonvulsant medications:  topiramate Past vitamins/Herbal/Supplements:  no Past antihistamines/decongestants:  no Other past therapies:  no  Caffeine:  3 diet Mt. Dews daily Alcohol:  no Smoker:  no Diet:  hydrates Exercise:  yes Depression:  no; Anxiety:  no Other pain:  Chronic pain Sleep hygiene:  good Family history of headache:  Sister had migraines as a child  PAST MEDICAL HISTORY: Past Medical History:  Diagnosis Date  . Adult ADHD   . Chronic headaches   . Chronic pain   . Chronic prostatitis   . Condyloma acuminatum of penis   . Crohn disease ([facility]) Faxonwith terminal ileitis  . ED (erectile dysfunction)   . Gastric ulcer   . Inflammatory bowel disease   . Microcytic anemia   . Peyronie's disease     PAST SURGICAL HISTORY: Past Surgical History:  Procedure Laterality Date  . COLON SURGERY  2000   removal of colon and small intestine due to Crohn's  . COLONOSCOPY  07/21/2015   also done in 10/10, 6/00  . ESOPHAGOGASTRODUODENOSCOPY  03/08/1999    MEDICATIONS: Current Outpatient Medications on File Prior to Visit  Medication Sig Dispense Refill  . amphetamine-dextroamphetamine (ADDERALL XR) 20 MG 24 hr capsule Take 1 capsule (20 mg total) by mouth every morning. 30 capsule 0  . amphetamine-dextroamphetamine (ADDERALL) 10 MG tablet 5-10mg 44mPRN on bad days 30  tablet 0  . butalbital-acetaminophen-caffeine (FIORICET WITH CODEINE) 50-325-40-30 MG capsule Take 1 capsule by mouth every 4 (four) hours. Taking too much of this will make your headaches worse- please limit it 60 capsule 0  . esomeprazole (NEXIUM) 40 MG  capsule Take 40 mg by mouth.    Marland Kitchen HYDROcodone-acetaminophen (NORCO) 7.5-325 MG tablet Take 1 tablet by mouth 3 (three) times daily. 45 tablet 0  . mesalamine (PENTASA) 500 MG CR capsule Take 1,000 mg by mouth 3 (three) times daily as needed.     . nortriptyline (PAMELOR) 25 MG capsule Take 1 capsule (25 mg total) by mouth at bedtime. 30 capsule 3  . SUMAtriptan (IMITREX) 100 MG tablet Take 1 tablet (100 mg total) by mouth every 2 (two) hours as needed for migraine. May repeat in 2 hours if headache persists or recurs. 10 tablet 12   No current facility-administered medications on file prior to visit.     ALLERGIES: No Known Allergies  FAMILY HISTORY: Family History  Problem Relation Age of Onset  . Irritable bowel syndrome Mother   . Cirrhosis Father   . Alzheimer's disease Maternal Grandmother     SOCIAL HISTORY: Social History   Socioeconomic History  . Marital status: Married    Spouse name: Dottie  . Number of children: 4  . Years of education: Not on file  . Highest education level: Some college, no degree  Occupational History  . Occupation: Office manager  . Financial resource strain: Not on file  . Food insecurity:    Worry: Not on file    Inability: Not on file  . Transportation needs:    Medical: Not on file    Non-medical: Not on file  Tobacco Use  . Smoking status: Never Smoker  . Smokeless tobacco: Never Used  Substance and Sexual Activity  . Alcohol use: No  . Drug use: No  . Sexual activity: Yes  Lifestyle  . Physical activity:    Days per week: Not on file    Minutes per session: Not on file  . Stress: Not on file  Relationships  . Social connections:    Talks on phone: Not on file    Gets together: Not on file    Attends religious service: Not on file    Active member of club or organization: Not on file    Attends meetings of clubs or organizations: Not on file    Relationship status: Not on file  . Intimate partner violence:     Fear of current or ex partner: Not on file    Emotionally abused: Not on file    Physically abused: Not on file    Forced sexual activity: Not on file  Other Topics Concern  . Not on file  Social History Narrative   Pt is right handed. He is married, lives in a one story house with a basement. He drinks 3 sodas a day. He is very active at work.    REVIEW OF SYSTEMS: Constitutional: No fevers, chills, or sweats, no generalized fatigue, change in appetite Eyes: No visual changes, double vision, eye pain Ear, nose and throat: No hearing loss, ear pain, nasal congestion, sore throat Cardiovascular: No chest pain, palpitations Respiratory:  No shortness of breath at rest or with exertion, wheezes GastrointestinaI: No nausea, vomiting, diarrhea, abdominal pain, fecal incontinence Genitourinary:  No dysuria, urinary retention or frequency Musculoskeletal:  No neck pain, back pain Integumentary: No rash, pruritus, skin lesions Neurological: as above  Psychiatric: No depression, insomnia, anxiety Endocrine: No palpitations, fatigue, diaphoresis, mood swings, change in appetite, change in weight, increased thirst Hematologic/Lymphatic:  No purpura, petechiae. Allergic/Immunologic: no itchy/runny eyes, nasal congestion, recent allergic reactions, rashes  PHYSICAL EXAM: Vitals:  BP 112/84, Pulse 77, Wt 201 lb, Ht 5'10" General: No acute distress.  Patient appears well-groomed.  Head:  Normocephalic/atraumatic Eyes:  fundi examined but not visualized Neck: supple, no paraspinal tenderness, full range of motion Back: No paraspinal tenderness Heart: regular rate and rhythm Lungs: Clear to auscultation bilaterally. Vascular: No carotid bruits. Neurological Exam: Mental status: alert and oriented to person, place, and time, recent and remote memory intact, fund of knowledge intact, attention and concentration intact, speech fluent and not dysarthric, language intact. Cranial nerves: CN I: not  tested CN II: pupils equal, round and reactive to light, visual fields intact CN III, IV, VI:  full range of motion, no nystagmus, no ptosis CN V: facial sensation intact CN VII: upper and lower face symmetric CN VIII: hearing intact CN IX, X: gag intact, uvula midline CN XI: sternocleidomastoid and trapezius muscles intact CN XII: tongue midline Bulk & Tone: normal, no fasciculations. Motor:  5/5 throughout  Sensation: temperature and vibration sensation intact. Deep Tendon Reflexes:  2+ throughout, toes downgoing.  Finger to nose testing:  Without dysmetria.  Heel to shin:  Without dysmetria.  Gait:  Normal station and stride.  Able to turn and tandem walk. Romberg negative.  IMPRESSION: Chronic migraines with aura, not intractable Tension-type vs medication overuse headache  PLAN: 1.  He just started nortriptyline 42m 3 weeks ago.  If headaches do not improve over next week, then he will contact me and we can increase dose to 537mat bedtime. 2. Stop Fioricet and all OTC pain relievers.  Abortive therapy will only be sumatriptan 10051mlimited to no more than 2 days out of week to prevent rebound headache. 3.  Headache diary 4.  Follow up in 3 months.   Thank you for allowing me to take part in the care of this patient.  AdaMetta ClinesO  CC: MegPark LiterO

## 2018-01-23 NOTE — Telephone Encounter (Signed)
Too soon. Will not refill that.

## 2018-01-24 ENCOUNTER — Encounter: Payer: Self-pay | Admitting: Neurology

## 2018-01-24 NOTE — Telephone Encounter (Signed)
Called and left a detailed message on patients voice mail.

## 2018-01-29 ENCOUNTER — Encounter: Payer: Self-pay | Admitting: Neurology

## 2018-01-31 ENCOUNTER — Telehealth: Payer: Self-pay | Admitting: Family Medicine

## 2018-01-31 NOTE — Telephone Encounter (Signed)
Error

## 2018-02-01 ENCOUNTER — Encounter: Payer: Self-pay | Admitting: Family Medicine

## 2018-02-01 ENCOUNTER — Ambulatory Visit: Payer: Managed Care, Other (non HMO) | Admitting: Family Medicine

## 2018-02-01 VITALS — BP 130/85 | HR 67 | Temp 97.6°F | Wt 197.5 lb

## 2018-02-01 DIAGNOSIS — F909 Attention-deficit hyperactivity disorder, unspecified type: Secondary | ICD-10-CM | POA: Diagnosis not present

## 2018-02-01 DIAGNOSIS — Z79899 Other long term (current) drug therapy: Secondary | ICD-10-CM | POA: Diagnosis not present

## 2018-02-01 DIAGNOSIS — G43009 Migraine without aura, not intractable, without status migrainosus: Secondary | ICD-10-CM | POA: Diagnosis not present

## 2018-02-01 MED ORDER — AMPHETAMINE-DEXTROAMPHETAMINE 10 MG PO TABS: ORAL_TABLET | ORAL | 0 refills | Status: DC

## 2018-02-01 MED ORDER — AMPHETAMINE-DEXTROAMPHET ER 20 MG PO CP24: 20.0000 mg | ORAL_CAPSULE | ORAL | 0 refills | Status: DC

## 2018-02-01 MED ORDER — NORTRIPTYLINE HCL 50 MG PO CAPS: 50.0000 mg | ORAL_CAPSULE | Freq: Every day | ORAL | 3 refills | Status: DC

## 2018-02-01 NOTE — Assessment & Plan Note (Signed)
He notes that he is not better. Will increase his nortripytline to 63m daily and recheck in 1-2 months. Continue to follow with neurology. Call with any concerns. No more fiorect.

## 2018-02-01 NOTE — Progress Notes (Signed)
BP 130/85 (BP Location: Left Arm, Patient Position: Sitting, Cuff Size: Normal)   Pulse 67   Temp 97.6 F (36.4 C)   Wt 197 lb 8 oz (89.6 kg)   SpO2 100%   BMI 28.34 kg/m    Subjective:    Patient ID: Stephen Wise, male    DOB: October 28, 1970, 47 y.o.   MRN: [id number]  HPI: Stephen Wise is a 47 y.o. male  Chief Complaint  Patient presents with  . ADD   MIGRAINES- saw Dr. Tomi Likens on 01/23/18, he thinks that he has significant rebound. Wants him to stop all fioricet and OTC pain relievers. He is on 69m of nortriptyline which was started a month ago. He notes that he had month end last week and has been having work headaches. He is not happy about having to come off of his fiorect- he feels like it really helps, he is anxious about that. He is continuing to take imitrex at least 3-4x a week Duration: chronic Onset: sudden Severity: severe Quality: stabbing, pounding Frequency: 2-3x a week Location: Left temple and global Headache duration: Radiation: throughout his head Time of day headache occurs: at random  Alleviating factors: sumatriptan, fiorecet Aggravating factors:  light Headache status at time of visit: asymptomatic Treatments attempted: Treatments attempted: nortriptyline, rest, ice, heat, APAP, ibuprofen, aleve", excedrine, triptans and propranolol   Aura: yes Nausea:  yes Vomiting: no Photophobia:  yes Phonophobia:  no Effect on social functioning:  yes Confusion:  no Gait disturbance/ataxia:  no Behavioral changes:  no Fevers:  no  ADHD FOLLOW UP ADHD status: controlled Satisfied with current therapy: yes Medication compliance:  excellent compliance Controlled substance contract: yes Previous psychiatry evaluation: yes Previous medications: yes    Taking meds on weekends/vacations: yes Work/school performance:  good Difficulty sustaining attention/completing tasks: no Distracted by extraneous stimuli: no Does not listen when spoken to: no  Fidgets with  hands or feet: no Unable to stay in seat: no Blurts out/interrupts others: no ADHD Medication Side Effects: no    Decreased appetite: no    Headache: no    Sleeping disturbance pattern: no    Irritability: no    Rebound effects (worse than baseline) off medication: no    Anxiousness: no    Dizziness: no    Tics: no  Relevant past medical, surgical, family and social history reviewed and updated as indicated. Interim medical history since our last visit reviewed. Allergies and medications reviewed and updated.  Review of Systems  Constitutional: Negative.   Eyes: Positive for photophobia. Negative for pain, discharge, redness, itching and visual disturbance.  Respiratory: Negative.   Cardiovascular: Negative.   Neurological: Positive for headaches. Negative for dizziness, tremors, seizures, syncope, facial asymmetry, speech difficulty, weakness, light-headedness and numbness.  Psychiatric/Behavioral: Negative.     Per HPI unless specifically indicated above     Objective:    BP 130/85 (BP Location: Left Arm, Patient Position: Sitting, Cuff Size: Normal)   Pulse 67   Temp 97.6 F (36.4 C)   Wt 197 lb 8 oz (89.6 kg)   SpO2 100%   BMI 28.34 kg/m   Wt Readings from Last 3 Encounters:  02/01/18 197 lb 8 oz (89.6 kg)  01/23/18 201 lb (91.2 kg)  01/22/18 200 lb (90.7 kg)    Physical Exam  Constitutional: He is oriented to person, place, and time. He appears well-developed and well-nourished. No distress.  HENT:  Head: Normocephalic and atraumatic.  Right Ear: Hearing normal.  Left Ear:  Hearing normal.  Nose: Nose normal.  Eyes: Conjunctivae and lids are normal. Right eye exhibits no discharge. Left eye exhibits no discharge. No scleral icterus.  Cardiovascular: Normal rate, regular rhythm, normal heart sounds and intact distal pulses. Exam reveals no gallop and no friction rub.  No murmur heard. Pulmonary/Chest: Effort normal and breath sounds normal. No stridor. No  respiratory distress. He has no wheezes. He has no rales. He exhibits no tenderness.  Musculoskeletal: Normal range of motion.  Neurological: He is alert and oriented to person, place, and time.  Skin: Skin is intact. No rash noted. He is not diaphoretic.  Psychiatric: He has a normal mood and affect. His speech is normal and behavior is normal. Judgment and thought content normal. Cognition and memory are normal.    Results for orders placed or performed in visit on 03/08/17  Compliance Drug Analysis, Ur  Result Value Ref Range   Summary FINAL       Assessment & Plan:   Problem List Items Addressed This Visit      Cardiovascular and Mediastinum   Migraine without aura and without status migrainosus, not intractable - Primary    He notes that he is not better. Will increase his nortripytline to 43m daily and recheck in 1-2 months. Continue to follow with neurology. Call with any concerns. No more fiorect.       Relevant Medications   nortriptyline (PAMELOR) 50 MG capsule     Other   Adult ADHD    Under good control on current regimen. Continue to monitor. Call with any concerns. 3 month supply given today.       Other Visit Diagnoses    Long-term use of high-risk medication       Utox done today. Await results.    Relevant Orders   [id number]+Alc+Crt-Bund       Follow up plan: Return in about 3 months (around 05/04/2018).

## 2018-02-01 NOTE — Assessment & Plan Note (Signed)
Under good control on current regimen. Continue to monitor. Call with any concerns. 3 month supply given today.

## 2018-02-06 ENCOUNTER — Telehealth: Payer: Self-pay | Admitting: Family Medicine

## 2018-02-06 NOTE — Telephone Encounter (Signed)
Can we please check with lab about urine results- should be back by now, and I don't see it.

## 2018-02-07 LAB — DRUG SCREEN 764883 11+OXYCO+ALC+CRT-BUND
BENZODIAZ UR QL: NEGATIVE ng/mL
COCAINE (METABOLITE): NEGATIVE ng/mL
Cannabinoid Quant, Ur: NEGATIVE ng/mL
Creatinine: 32.4 mg/dL (ref 20.0–300.0)
Ethanol: NEGATIVE %
Meperidine: NEGATIVE ng/mL
Methadone Screen, Urine: NEGATIVE ng/mL
Oxycodone/Oxymorphone, Urine: NEGATIVE ng/mL
PH OF URINE: 6.1 (ref 4.5–8.9)
PHENCYCLIDINE: NEGATIVE ng/mL
Propoxyphene: NEGATIVE ng/mL
Tramadol: NEGATIVE ng/mL

## 2018-02-07 LAB — DRUG PROFILE 799016
AMPHETAMINE CONF, UR: 3056 ng/mL
AMPHETAMINE, UR: POSITIVE — AB
Amphetamines: POSITIVE — AB
Methamphetamine: NEGATIVE

## 2018-02-07 LAB — OPIATES CONFIRMATION, URINE
Codeine Confirm: >3000 ng/mL
Codeine: POSITIVE — AB
HYDROCODONE: NEGATIVE
HYDROMORPHONE: NEGATIVE
MORPHINE CONFIRM: 1863 ng/mL
Morphine: POSITIVE — AB
Opiates: POSITIVE ng/mL — AB

## 2018-02-07 LAB — DRUG PROFILE 799023
AMOBARBITAL: NEGATIVE
BARBITURATES: POSITIVE — AB
Butalbital GC/MS: 4190 ng/mL
Butalbital: POSITIVE — AB
PENTOBARBITAL LVL: NEGATIVE
Phenobarbital: NEGATIVE
SECOBARBITAL: NEGATIVE

## 2018-02-07 NOTE — Telephone Encounter (Signed)
Spoke with Merry Proud, he states that it will be another 3 to 4 days until these are finished, he will print what has been finished and give it to [place].

## 2018-02-07 NOTE — Telephone Encounter (Signed)
Noted. Thanks.

## 2018-02-21 ENCOUNTER — Encounter: Payer: Self-pay | Admitting: Family Medicine

## 2018-02-23 ENCOUNTER — Emergency Department
Admission: EM | Admit: 2018-02-23 | Discharge: 2018-02-23 | Disposition: A | Discharge status: Home / Self Care | Payer: Managed Care, Other (non HMO) | Attending: Emergency Medicine | Admitting: Emergency Medicine

## 2018-02-23 DIAGNOSIS — Y999 Unspecified external cause status: Secondary | ICD-10-CM | POA: Diagnosis not present

## 2018-02-23 DIAGNOSIS — Y939 Activity, unspecified: Secondary | ICD-10-CM | POA: Insufficient documentation

## 2018-02-23 DIAGNOSIS — Y929 Unspecified place or not applicable: Secondary | ICD-10-CM | POA: Insufficient documentation

## 2018-02-23 DIAGNOSIS — Z79899 Other long term (current) drug therapy: Secondary | ICD-10-CM | POA: Diagnosis not present

## 2018-02-23 DIAGNOSIS — W57XXXA Bitten or stung by nonvenomous insect and other nonvenomous arthropods, initial encounter: Secondary | ICD-10-CM | POA: Insufficient documentation

## 2018-02-23 DIAGNOSIS — L03115 Cellulitis of right lower limb: Secondary | ICD-10-CM | POA: Diagnosis not present

## 2018-02-23 DIAGNOSIS — S90561A Insect bite (nonvenomous), right ankle, initial encounter: Secondary | ICD-10-CM | POA: Insufficient documentation

## 2018-02-23 MED ORDER — SULFAMETHOXAZOLE-TRIMETHOPRIM 800-160 MG PO TABS
1.0000 | ORAL_TABLET | Freq: Once | ORAL | Status: AC
Start: 2018-02-23 — End: 2018-02-23
  Administered 2018-02-23: 1 via ORAL
  Filled 2018-02-23: qty 1

## 2018-02-23 MED ORDER — SULFAMETHOXAZOLE-TRIMETHOPRIM 800-160 MG PO TABS: 1.0000 | ORAL_TABLET | Freq: Two times a day (BID) | ORAL | 0 refills | Status: DC

## 2018-02-23 NOTE — ED Triage Notes (Signed)
Patient c/o possible insect bite to medial right ankle. Patient noticed bite yesterday, reports increased pain today.

## 2018-02-23 NOTE — ED Provider Notes (Signed)
[facility] Provider Note   CSN: [id number] Arrival date & time: 02/23/18  1926     History   Chief Complaint Chief Complaint  Patient presents with  . Insect Bite    HPI Stephen Wise is a 47 y.o. male presents to the emergency department for evaluation of insect bite to the right medial ankle.  Patient noticed this 1 day ago and noticed some itching.  There is a small bump present.  Last night there was increased pain, redness and swelling with a vesicular lesion.  No ulceration.  Patient has having mild discomfort.  He denies any fevers.  He is ambulatory with no limp.  HPI  Past Medical History:  Diagnosis Date  . Adult ADHD   . Chronic headaches   . Chronic pain   . Chronic prostatitis   . Condyloma acuminatum of penis   . Crohn disease ([place])    with terminal ileitis  . ED (erectile dysfunction)   . Gastric ulcer   . Inflammatory bowel disease   . Microcytic anemia   . Peyronie's disease     Patient Active Problem List   Diagnosis Date Noted  . Migraine without aura and without status migrainosus, not intractable 05/12/2017  . Controlled substance agreement signed 05/12/2017  . Chronic pain   . Inflammatory bowel disease   . Chronic pain syndrome 03/08/2017  . Long term current use of opiate analgesic 03/08/2017  . Neck pain 03/08/2017  . Hand pain 03/08/2017  . Chronic prostatitis 11/26/2015  . Condyloma acuminatum of penis 11/26/2015  . Peyronie's disease 11/26/2015  . Adult ADHD 11/12/2014  . Pain syndrome, chronic 11/12/2014  . Chronic cough 03/13/2014  . Chronic headaches 03/13/2014  . Crohn's disease ([place]) 03/13/2014  . Disc disease, degenerative, lumbar or lumbosacral 03/13/2014  . Gastric ulcer 03/13/2014  . Hypoglycemia 03/13/2014  . Microcytic anemia 03/13/2014    Past Surgical History:  Procedure Laterality Date  . COLON SURGERY  2000   removal of colon and small intestine due to Crohn's  .  COLONOSCOPY  07/21/2015   also done in 10/10, 6/00  . ESOPHAGOGASTRODUODENOSCOPY  03/08/1999        Home Medications    Prior to Admission medications   Medication Sig Start Date End Date Taking? Authorizing Provider  amphetamine-dextroamphetamine (ADDERALL XR) 20 MG 24 hr capsule Take 1 capsule (20 mg total) by mouth every morning. 02/01/18   Johnson, Megan P, DO  amphetamine-dextroamphetamine (ADDERALL) 10 MG tablet 5-16m qPM PRN on bad days 02/01/18   JPark LiterP, DO  esomeprazole (NEXIUM) 40 MG capsule Take 40 mg by mouth. 05/07/14   [provider]  HYDROcodone-acetaminophen (NORCO) 7.5-325 MG tablet Take 1 tablet by mouth 3 (three) times daily. 01/22/18   AMolli Barrows MD  mesalamine (PENTASA) 500 MG CR capsule Take 1,000 mg by mouth 3 (three) times daily as needed.  05/18/15   [provider]  nortriptyline (PAMELOR) 50 MG capsule Take 1 capsule (50 mg total) by mouth at bedtime. 02/01/18   Johnson, Megan P, DO  sulfamethoxazole-trimethoprim (BACTRIM DS,SEPTRA DS) 800-160 MG tablet Take 1 tablet by mouth 2 (two) times daily. 02/23/18   GDuanne Guess PA-C  SUMAtriptan (IMITREX) 100 MG tablet Take 1 tablet (100 mg total) by mouth every 2 (two) hours as needed for migraine. May repeat in 2 hours if headache persists or recurs. 08/01/17   JValerie Roys DO    Family History Family History  Problem Relation Age of Onset  . Irritable bowel syndrome Mother   . Cirrhosis Father   . Alzheimer's disease Maternal Grandmother     Social History Social History   Tobacco Use  . Smoking status: Never Smoker  . Smokeless tobacco: Never Used  Substance Use Topics  . Alcohol use: No  . Drug use: No     Allergies   Patient has no known allergies.   Review of Systems Review of Systems  Constitutional: Negative for fever.  Respiratory: Negative for shortness of breath.   Cardiovascular: Negative for chest pain.  Musculoskeletal: Negative for arthralgias,  back pain, gait problem, joint swelling and myalgias.  Skin: Positive for rash and wound.  Neurological: Negative for headaches.     Physical Exam Updated Vital Signs BP 135/87 (BP Location: Right Arm)   Pulse 82   Temp 98.4 F (36.9 C) (Oral)   Resp 17   Ht 5' 10"  (1.778 m)   Wt 88.5 kg (195 lb)   SpO2 98%   BMI 27.98 kg/m   Physical Exam  Constitutional: He is oriented to person, place, and time. He appears well-developed and well-nourished.  HENT:  Head: Normocephalic and atraumatic.  Eyes: Conjunctivae are normal.  Neck: Normal range of motion.  Cardiovascular: Normal rate.  Pulmonary/Chest: Effort normal. No respiratory distress.  Musculoskeletal: Normal range of motion.  Right ankle medial with 4 x 4 centimeter area of erythema with centralized raised vesicle but is not ruptured.  There is no black necrotic tissue or ulceration noted.  Area of erythema slightly warm, nonindurated and nonfluctuant.  Patient has full range of motion of the ankle.  Rash is noted along the right medial aspect of the ankle just above the medial malleolus.  No signs of joint effusion.  Neurological: He is alert and oriented to person, place, and time.  Skin: Skin is warm. No rash noted.  Psychiatric: He has a normal mood and affect. His behavior is normal. Thought content normal.     ED Treatments / Results  Labs (all labs ordered are listed, but only abnormal results are displayed) Labs Reviewed - No data to display  EKG None  Radiology No results found.  Procedures Procedures (including critical care time)  Medications Ordered in ED Medications  sulfamethoxazole-trimethoprim (BACTRIM DS,SEPTRA DS) 800-160 MG per tablet 1 tablet (has no administration in time range)     Initial Impression / Assessment and Plan / ED Course  I have reviewed the triage vital signs and the nursing notes.  Pertinent labs & imaging results that were available during my care of the patient were  reviewed by me and considered in my medical decision making (see chart for details).    47 year old male with insect bite to the right lower leg, erythematous area consistent with mild cellulitis.  There is a vesicular lesion noted in the center of the area of erythema.  No sign of fluctuance or abscess formation.  No necrotic tissue.  We will treat with Bactrim DS and have patient keep covered and clean.  He is educated on signs and symptoms return to the ED for. Final Clinical Impressions(s) / ED Diagnoses   Final diagnoses:  Insect bite of right ankle, initial encounter  Cellulitis of right lower extremity    ED Discharge Orders        Ordered    sulfamethoxazole-trimethoprim (BACTRIM DS,SEPTRA DS) 800-160 MG tablet  2 times daily     02/23/18 2131  Renata Caprice 02/23/18 2136    Delman Kitten, MD 02/25/18 1606

## 2018-02-23 NOTE — Discharge Instructions (Addendum)
Please take antibiotics as prescribed.  Keep area clean and covered.  If blister ruptures, apply antibiotic ointment daily.  If any fevers, increasing redness, pain or swelling throughout the right lower extremity despite oral antibiotics, please return to the emergency department or urgent care facility for possible IV antibiotics.

## 2018-02-23 NOTE — ED Notes (Signed)

## 2018-02-24 ENCOUNTER — Other Ambulatory Visit: Payer: Self-pay | Admitting: Family Medicine

## 2018-02-28 ENCOUNTER — Ambulatory Visit: Payer: Managed Care, Other (non HMO) | Admitting: Anesthesiology

## 2018-03-23 ENCOUNTER — Encounter: Payer: Self-pay | Admitting: Family Medicine

## 2018-04-03 ENCOUNTER — Other Ambulatory Visit: Payer: Self-pay | Admitting: Anesthesiology

## 2018-04-03 ENCOUNTER — Encounter: Payer: Self-pay | Admitting: Anesthesiology

## 2018-04-03 ENCOUNTER — Other Ambulatory Visit: Payer: Self-pay | Admitting: Family Medicine

## 2018-04-03 ENCOUNTER — Ambulatory Visit: Payer: Managed Care, Other (non HMO) | Attending: Anesthesiology | Admitting: Anesthesiology

## 2018-04-03 ENCOUNTER — Other Ambulatory Visit: Payer: Self-pay

## 2018-04-03 VITALS — BP 96/69 | HR 82 | Temp 98.0°F | Resp 16 | Ht 70.0 in | Wt 195.0 lb

## 2018-04-03 DIAGNOSIS — G894 Chronic pain syndrome: Secondary | ICD-10-CM | POA: Diagnosis not present

## 2018-04-03 DIAGNOSIS — M47817 Spondylosis without myelopathy or radiculopathy, lumbosacral region: Secondary | ICD-10-CM | POA: Insufficient documentation

## 2018-04-03 DIAGNOSIS — M542 Cervicalgia: Secondary | ICD-10-CM | POA: Insufficient documentation

## 2018-04-03 DIAGNOSIS — M5431 Sciatica, right side: Secondary | ICD-10-CM | POA: Diagnosis not present

## 2018-04-03 DIAGNOSIS — Z79899 Other long term (current) drug therapy: Secondary | ICD-10-CM | POA: Insufficient documentation

## 2018-04-03 DIAGNOSIS — Z79891 Long term (current) use of opiate analgesic: Secondary | ICD-10-CM | POA: Insufficient documentation

## 2018-04-03 DIAGNOSIS — R52 Pain, unspecified: Secondary | ICD-10-CM

## 2018-04-03 DIAGNOSIS — M545 Low back pain: Secondary | ICD-10-CM | POA: Insufficient documentation

## 2018-04-03 DIAGNOSIS — M5432 Sciatica, left side: Secondary | ICD-10-CM | POA: Insufficient documentation

## 2018-04-03 MED ORDER — HYDROCODONE-ACETAMINOPHEN 5-325 MG PO TABS: 1.0000 | ORAL_TABLET | Freq: Three times a day (TID) | ORAL | 0 refills | Status: DC

## 2018-04-03 NOTE — Patient Instructions (Signed)
Pain Management Discharge Instructions  General Discharge Instructions :  If you need to reach your doctor call: Monday-Friday 8:00 am - 4:00 pm at [phone] or toll free [phone].  After clinic hours [phone] to have operator reach doctor.  Bring all of your medication bottles to all your appointments in the pain clinic.  To cancel or reschedule your appointment with Pain Management please remember to call 24 hours in advance to avoid a fee.  Refer to the educational materials which you have been given on: General Risks, I had my Procedure. Discharge Instructions, Post Sedation.  Post Procedure Instructions:  The drugs you were given will stay in your system until tomorrow, so for the next 24 hours you should not drive, make any legal decisions or drink any alcoholic beverages.  You may eat anything you prefer, but it is better to start with liquids then soups and crackers, and gradually work up to solid foods.  Please notify your doctor immediately if you have any unusual bleeding, trouble breathing or pain that is not related to your normal pain.  Depending on the type of procedure that was done, some parts of your body may feel week and/or numb.  This usually clears up by tonight or the next day.  Walk with the use of an assistive device or accompanied by an adult for the 24 hours.  You may use ice on the affected area for the first 24 hours.  Put ice in a Ziploc bag and cover with a towel and place against area 15 minutes on 15 minutes off.  You may switch to heat after 24 hours.GENERAL RISKS AND COMPLICATIONS  What are the risk, side effects and possible complications? Generally speaking, most procedures are safe.  However, with any procedure there are risks, side effects, and the possibility of complications.  The risks and complications are dependent upon the sites that are lesioned, or the type of nerve block to be performed.  The closer the procedure is to the spine,  the more serious the risks are.  Great care is taken when placing the radio frequency needles, block needles or lesioning probes, but sometimes complications can occur. 1. Infection: Any time there is an injection through the skin, there is a risk of infection.  This is why sterile conditions are used for these blocks.  There are four possible types of infection. 1. Localized skin infection. 2. Central Nervous System Infection-This can be in the form of Meningitis, which can be deadly. 3. Epidural Infections-This can be in the form of an epidural abscess, which can cause pressure inside of the spine, causing compression of the spinal cord with subsequent paralysis. This would require an emergency surgery to decompress, and there are no guarantees that the patient would recover from the paralysis. 4. Discitis-This is an infection of the intervertebral discs.  It occurs in about 1% of discography procedures.  It is difficult to treat and it may lead to surgery.        2. Pain: the needles have to go through skin and soft tissues, will cause soreness.       3. Damage to internal structures:  The nerves to be lesioned may be near blood vessels or    other nerves which can be potentially damaged.       4. Bleeding: Bleeding is more common if the patient is taking blood thinners such as  aspirin, Coumadin, Ticiid, Plavix, etc., or if he/she have some genetic predisposition  such as  hemophilia. Bleeding into the spinal canal can cause compression of the spinal  cord with subsequent paralysis.  This would require an emergency surgery to  decompress and there are no guarantees that the patient would recover from the  paralysis.       5. Pneumothorax:  Puncturing of a lung is a possibility, every time a needle is introduced in  the area of the chest or upper back.  Pneumothorax refers to free air around the  collapsed lung(s), inside of the thoracic cavity (chest cavity).  Another two possible  complications  related to a similar event would include: Hemothorax and Chylothorax.   These are variations of the Pneumothorax, where instead of air around the collapsed  lung(s), you may have blood or chyle, respectively.       6. Spinal headaches: They may occur with any procedures in the area of the spine.       7. Persistent CSF (Cerebro-Spinal Fluid) leakage: This is a rare problem, but may occur  with prolonged intrathecal or epidural catheters either due to the formation of a fistulous  track or a dural tear.       8. Nerve damage: By working so close to the spinal cord, there is always a possibility of  nerve damage, which could be as serious as a permanent spinal cord injury with  paralysis.       9. Death:  Although rare, severe deadly allergic reactions known as "Anaphylactic  reaction" can occur to any of the medications used.      10. Worsening of the symptoms:  We can always make thing worse.  What are the chances of something like this happening? Chances of any of this occuring are extremely low.  By statistics, you have more of a chance of getting killed in a motor vehicle accident: while driving to the hospital than any of the above occurring .  Nevertheless, you should be aware that they are possibilities.  In general, it is similar to taking a shower.  Everybody knows that you can slip, hit your head and get killed.  Does that mean that you should not shower again?  Nevertheless always keep in mind that statistics do not mean anything if you happen to be on the wrong side of them.  Even if a procedure has a 1 (one) in a 1,000,000 (million) chance of going wrong, it you happen to be that one..Also, keep in mind that by statistics, you have more of a chance of having something go wrong when taking medications.  Who should not have this procedure? If you are on a blood thinning medication (e.g. Coumadin, Plavix, see list of "Blood Thinners"), or if you have an active infection going on, you should not  have the procedure.  If you are taking any blood thinners, please inform your physician.  How should I prepare for this procedure?  Do not eat or drink anything at least six hours prior to the procedure.  Bring a driver with you .  It cannot be a taxi.  Come accompanied by an adult that can drive you back, and that is strong enough to help you if your legs get weak or numb from the local anesthetic.  Take all of your medicines the morning of the procedure with just enough water to swallow them.  If you have diabetes, make sure that you are scheduled to have your procedure done first thing in the morning, whenever possible.  If you have diabetes,  take only half of your insulin dose and notify our nurse that you have done so as soon as you arrive at the clinic.  If you are diabetic, but only take blood sugar pills (oral hypoglycemic), then do not take them on the morning of your procedure.  You may take them after you have had the procedure.  Do not take aspirin or any aspirin-containing medications, at least eleven (11) days prior to the procedure.  They may prolong bleeding.  Wear loose fitting clothing that may be easy to take off and that you would not mind if it got stained with Betadine or blood.  Do not wear any jewelry or perfume  Remove any nail coloring.  It will interfere with some of our monitoring equipment.  NOTE: Remember that this is not meant to be interpreted as a complete list of all possible complications.  Unforeseen problems may occur.  BLOOD THINNERS The following drugs contain aspirin or other products, which can cause increased bleeding during surgery and should not be taken for 2 weeks prior to and 1 week after surgery.  If you should need take something for relief of minor pain, you may take acetaminophen which is found in Tylenol,m Datril, Anacin-3 and Panadol. It is not blood thinner. The products listed below are.  Do not take any of the products listed below  in addition to any listed on your instruction sheet.  A.P.C or A.P.C with Codeine Codeine Phosphate Capsules #3 Ibuprofen Ridaura  ABC compound Congesprin Imuran rimadil  Advil Cope Indocin Robaxisal  Alka-Seltzer Effervescent Pain Reliever and Antacid Coricidin or Coricidin-D  Indomethacin Rufen  Alka-Seltzer plus Cold Medicine Cosprin Ketoprofen S-A-C Tablets  Anacin Analgesic Tablets or Capsules Coumadin Korlgesic Salflex  Anacin Extra Strength Analgesic tablets or capsules CP-2 Tablets Lanoril Salicylate  Anaprox Cuprimine Capsules Levenox Salocol  Anexsia-D Dalteparin Magan Salsalate  Anodynos Darvon compound Magnesium Salicylate Sine-off  Ansaid Dasin Capsules Magsal Sodium Salicylate  Anturane Depen Capsules Marnal Soma  APF Arthritis pain formula Dewitt's Pills Measurin Stanback  Argesic Dia-Gesic Meclofenamic Sulfinpyrazone  Arthritis Bayer Timed Release Aspirin Diclofenac Meclomen Sulindac  Arthritis pain formula Anacin Dicumarol Medipren Supac  Analgesic (Safety coated) Arthralgen Diffunasal Mefanamic Suprofen  Arthritis Strength Bufferin Dihydrocodeine Mepro Compound Suprol  Arthropan liquid Dopirydamole Methcarbomol with Aspirin Synalgos  ASA tablets/Enseals Disalcid Micrainin Tagament  Ascriptin Doan's Midol Talwin  Ascriptin A/D Dolene Mobidin Tanderil  Ascriptin Extra Strength Dolobid Moblgesic Ticlid  Ascriptin with Codeine Doloprin or Doloprin with Codeine Momentum Tolectin  Asperbuf Duoprin Mono-gesic Trendar  Aspergum Duradyne Motrin or Motrin IB Triminicin  Aspirin plain, buffered or enteric coated Durasal Myochrisine Trigesic  Aspirin Suppositories Easprin Nalfon Trillsate  Aspirin with Codeine Ecotrin Regular or Extra Strength Naprosyn Uracel  Atromid-S Efficin Naproxen Ursinus  Auranofin Capsules Elmiron Neocylate Vanquish  Axotal Emagrin Norgesic Verin  Azathioprine Empirin or Empirin with Codeine Normiflo Vitamin E  Azolid Emprazil Nuprin Voltaren  Bayer  Aspirin plain, buffered or children's or timed BC Tablets or powders Encaprin Orgaran Warfarin Sodium  Buff-a-Comp Enoxaparin Orudis Zorpin  Buff-a-Comp with Codeine Equegesic Os-Cal-Gesic   Buffaprin Excedrin plain, buffered or Extra Strength Oxalid   Bufferin Arthritis Strength Feldene Oxphenbutazone   Bufferin plain or Extra Strength Feldene Capsules Oxycodone with Aspirin   Bufferin with Codeine Fenoprofen Fenoprofen Pabalate or Pabalate-SF   Buffets II Flogesic Panagesic   Buffinol plain or Extra Strength Florinal or Florinal with Codeine Panwarfarin   Buf-Tabs Flurbiprofen Penicillamine   Butalbital Compound Four-way cold tablets  Penicillin   Butazolidin Fragmin Pepto-Bismol   Carbenicillin Geminisyn Percodan   Carna Arthritis Reliever Geopen Persantine   Carprofen Gold's salt Persistin   Chloramphenicol Goody's Phenylbutazone   Chloromycetin Haltrain Piroxlcam   Clmetidine heparin Plaquenil   Cllnoril Hyco-pap Ponstel   Clofibrate Hydroxy chloroquine Propoxyphen         Before stopping any of these medications, be sure to consult the physician who ordered them.  Some, such as Coumadin (Warfarin) are ordered to prevent or treat serious conditions such as "deep thrombosis", "pumonary embolisms", and other heart problems.  The amount of time that you may need off of the medication may also vary with the medication and the reason for which you were taking it.  If you are taking any of these medications, please make sure you notify your pain physician before you undergo any procedures.         Epidural Steroid Injection Patient Information  Description: The epidural space surrounds the nerves as they exit the spinal cord.  In some patients, the nerves can be compressed and inflamed by a bulging disc or a tight spinal canal (spinal stenosis).  By injecting steroids into the epidural space, we can bring irritated nerves into direct contact with a potentially helpful medication.   These steroids act directly on the irritated nerves and can reduce swelling and inflammation which often leads to decreased pain.  Epidural steroids may be injected anywhere along the spine and from the neck to the low back depending upon the location of your pain.   After numbing the skin with local anesthetic (like Novocaine), a small needle is passed into the epidural space slowly.  You may experience a sensation of pressure while this is being done.  The entire block usually last less than 10 minutes.  Conditions which may be treated by epidural steroids:   Low back and leg pain  Neck and arm pain  Spinal stenosis  Post-laminectomy syndrome  Herpes zoster (shingles) pain  Pain from compression fractures  Preparation for the injection:  1. Do not eat any solid food or dairy products within 8 hours of your appointment.  2. You may drink clear liquids up to 3 hours before appointment.  Clear liquids include water, black coffee, juice or soda.  No milk or cream please. 3. You may take your regular medication, including pain medications, with a sip of water before your appointment  Diabetics should hold regular insulin (if taken separately) and take 1/2 normal NPH dos the morning of the procedure.  Carry some sugar containing items with you to your appointment. 4. A driver must accompany you and be prepared to drive you home after your procedure.  5. Bring all your current medications with your. 6. An IV may be inserted and sedation may be given at the discretion of the physician.   7. A blood pressure cuff, EKG and other monitors will often be applied during the procedure.  Some patients may need to have extra oxygen administered for a short period. 8. You will be asked to provide medical information, including your allergies, prior to the procedure.  We must know immediately if you are taking blood thinners (like Coumadin/Warfarin)  Or if you are allergic to IV iodine contrast (dye). We  must know if you could possible be pregnant.  Possible side-effects:  Bleeding from needle site  Infection (rare, may require surgery)  Nerve injury (rare)  Numbness & tingling (temporary)  Difficulty urinating (rare, temporary)  Spinal headache (  a headache worse with upright posture)  Light -headedness (temporary)  Pain at injection site (several days)  Decreased blood pressure (temporary)  Weakness in arm/leg (temporary)  Pressure sensation in back/neck (temporary)  Call if you experience:  Fever/chills associated with headache or increased back/neck pain.  Headache worsened by an upright position.  New onset weakness or numbness of an extremity below the injection site  Hives or difficulty breathing (go to the emergency room)  Inflammation or drainage at the infection site  Severe back/neck pain  Any new symptoms which are concerning to you  Please note:  Although the local anesthetic injected can often make your back or neck feel good for several hours after the injection, the pain will likely return.  It takes 3-7 days for steroids to work in the epidural space.  You may not notice any pain relief for at least that one week.  If effective, we will often do a series of three injections spaced 3-6 weeks apart to maximally decrease your pain.  After the initial series, we generally will wait several months before considering a repeat injection of the same type.  If you have any questions, please call [phone] [facility]

## 2018-04-03 NOTE — Progress Notes (Signed)
Nursing Pain Medication Assessment:  Safety precautions to be maintained throughout the outpatient stay will include: orient to surroundings, keep bed in low position, maintain call bell within reach at all times, provide assistance with transfer out of bed and ambulation.  Medication Inspection Compliance: Pill count conducted under aseptic conditions, in front of the patient. Neither the pills nor the bottle was removed from the patient's sight at any time. Once count was completed pills were immediately returned to the patient in their original bottle.  Medication: Hydrocodone/APAP Pill/Patch Count: 0 of 45 pills remain Pill/Patch Appearance: Markings consistent with prescribed medication Bottle Appearance: Standard pharmacy container. Clearly labeled. Filled Date: 05 / 22/ 2019 Last Medication intake:  Today

## 2018-04-04 NOTE — Progress Notes (Signed)
Subjective:  Patient ID: Stephen Wise, male    DOB: [birth year]  Age: 47 y.o. MRN: [id number]  CC: Back Pain (low)   Procedure: None  HPI Stephen Wise presents for reevaluation.  He was last seen in late April.  He continues to have some neck and low back pain.  The quality characteristic distribution of this pain are stable in nature.  Based on his narcotic assessment sheet he continues to derive good functional lifestyle improvement with his medications.  He still primarily having low back pain with spasming in the low back and some radiation in the bilateral calves.  No new weakness is noted.  His bowel bladder function is also been stable.  He has failed conservative therapy and is scheduled for an epidural steroid injection however secondary to some work-related conflicts he is unable to proceed with the epidural today.  He is tolerating his medications well and these are keeping his pain under reasonable relief.  He is generally averaging 2-3 hydrocodone per day and they enable him to continue to work effectively.  Outpatient Medications Prior to Visit  Medication Sig Dispense Refill  . amphetamine-dextroamphetamine (ADDERALL XR) 20 MG 24 hr capsule Take 1 capsule (20 mg total) by mouth every morning. 30 capsule 0  . amphetamine-dextroamphetamine (ADDERALL) 10 MG tablet 5-30m qPM PRN on bad days 30 tablet 0  . esomeprazole (NEXIUM) 40 MG capsule Take 40 mg by mouth.    . mesalamine (PENTASA) 500 MG CR capsule Take 1,000 mg by mouth 3 (three) times daily as needed.     . nortriptyline (PAMELOR) 50 MG capsule Take 1 capsule (50 mg total) by mouth at bedtime. 30 capsule 3  . sulfamethoxazole-trimethoprim (BACTRIM DS,SEPTRA DS) 800-160 MG tablet Take 1 tablet by mouth 2 (two) times daily. 14 tablet 0  . SUMAtriptan (IMITREX) 100 MG tablet Take 1 tablet (100 mg total) by mouth every 2 (two) hours as needed for migraine. May repeat in 2 hours if headache persists or recurs. 10 tablet 12  .  HYDROcodone-acetaminophen (NORCO) 7.5-325 MG tablet Take 1 tablet by mouth 3 (three) times daily. 45 tablet 0   No facility-administered medications prior to visit.     Review of Systems CNS: No confusion or sedation Cardiac: No angina or palpitations GI: No abdominal pain or constipation Constitutional: No nausea vomiting fevers or chills  Objective:  BP 96/69   Pulse 82   Temp 98 F (36.7 C)   Resp 16   Ht 5' 10"  (1.778 m)   Wt 195 lb (88.5 kg)   SpO2 98%   BMI 27.98 kg/m    BP Readings from Last 3 Encounters:  04/03/18 96/69  02/23/18 135/87  02/01/18 130/85     Wt Readings from Last 3 Encounters:  04/03/18 195 lb (88.5 kg)  02/23/18 195 lb (88.5 kg)  02/01/18 197 lb 8 oz (89.6 kg)     Physical Exam Pt is alert and oriented PERRL EOMI HEART IS RRR no murmur or rub LCTA no wheezing or rales MUSCULOSKELETAL reveals some persistent paraspinous muscle tenderness but no overt trigger points.  His strength the lower extremities is good and he continues to ambulate well.  Labs  No results found for: HGBA1C No results found for: GLUF, MICROALBUR, LDLCALC, CREATININE  -------------------------------------------------------------------------------------------------------------------- No results found for: WBC, HGB, HCT, PLT, GLUCOSE, CHOL, TRIG, HDL, LDLDIRECT, LDLCALC, ALT, AST, NA, K, CL, CREATININE, BUN, CO2, TSH, PSA, INR, GLUF, HGBA1C, MICROALBUR  --------------------------------------------------------------------------------------------------------------------- No results found.   Assessment &  Plan:   Stephen Wise was seen today for back pain.  Diagnoses and all orders for this visit:  Neck pain  Lumbosacral spondylosis without myelopathy -     Lumbar Epidural Injection  Sciatica of left side -     Lumbar Epidural Injection  Chronic pain syndrome  Long term current use of opiate analgesic  Sciatica of right side  Other orders -     Discontinue:  HYDROcodone-acetaminophen (NORCO/VICODIN) 5-325 MG tablet; Take 1 tablet by mouth 3 (three) times daily. -     HYDROcodone-acetaminophen (NORCO/VICODIN) 5-325 MG tablet; Take 1 tablet by mouth 3 (three) times daily.        ----------------------------------------------------------------------------------------------------------------------  Problem List Items Addressed This Visit      Unprioritized   Chronic pain syndrome (Chronic)   Long term current use of opiate analgesic (Chronic)   Neck pain - Primary (Chronic)    Other Visit Diagnoses    Lumbosacral spondylosis without myelopathy       Relevant Medications   HYDROcodone-acetaminophen (NORCO/VICODIN) 5-325 MG tablet   Sciatica of left side       Sciatica of right side            ----------------------------------------------------------------------------------------------------------------------  1. Lumbosacral spondylosis without myelopathy I want to reschedule him for an epidural at his next visit in approximately 1 to 2 months. - Lumbar Epidural Injection  2. Sciatica of left side As above.  Continue with back stretching strengthening exercises. - Lumbar Epidural Injection  3. Neck pain Continue with stretching strengthening exercises as previously reviewed  4. Chronic pain syndrome Have reviewed the [facility] practitioner database information and it is appropriate.  5. Long term current use of opiate analgesic As above  6. Sciatica of right side As above    ----------------------------------------------------------------------------------------------------------------------  I am having Stephen Wise maintain his mesalamine, SUMAtriptan, esomeprazole, nortriptyline, amphetamine-dextroamphetamine, amphetamine-dextroamphetamine, sulfamethoxazole-trimethoprim, and HYDROcodone-acetaminophen.   Meds ordered this encounter  Medications  . DISCONTD: HYDROcodone-acetaminophen (NORCO/VICODIN) 5-325 MG  tablet    Sig: Take 1 tablet by mouth 3 (three) times daily.    Dispense:  75 tablet    Refill:  0  . HYDROcodone-acetaminophen (NORCO/VICODIN) 5-325 MG tablet    Sig: Take 1 tablet by mouth 3 (three) times daily.    Dispense:  75 tablet    Refill:  0    Do not fill until [id number]   Patient's Medications  New Prescriptions   HYDROCODONE-ACETAMINOPHEN (NORCO/VICODIN) 5-325 MG TABLET    Take 1 tablet by mouth 3 (three) times daily.  Previous Medications   AMPHETAMINE-DEXTROAMPHETAMINE (ADDERALL XR) 20 MG 24 HR CAPSULE    Take 1 capsule (20 mg total) by mouth every morning.   AMPHETAMINE-DEXTROAMPHETAMINE (ADDERALL) 10 MG TABLET    5-58m qPM PRN on bad days   ESOMEPRAZOLE (NEXIUM) 40 MG CAPSULE    Take 40 mg by mouth.   MESALAMINE (PENTASA) 500 MG CR CAPSULE    Take 1,000 mg by mouth 3 (three) times daily as needed.    NORTRIPTYLINE (PAMELOR) 50 MG CAPSULE    Take 1 capsule (50 mg total) by mouth at bedtime.   SULFAMETHOXAZOLE-TRIMETHOPRIM (BACTRIM DS,SEPTRA DS) 800-160 MG TABLET    Take 1 tablet by mouth 2 (two) times daily.   SUMATRIPTAN (IMITREX) 100 MG TABLET    Take 1 tablet (100 mg total) by mouth every 2 (two) hours as needed for migraine. May repeat in 2 hours if headache persists or recurs.  Modified Medications   No medications on file  Discontinued Medications   HYDROCODONE-ACETAMINOPHEN (NORCO) 7.5-325 MG TABLET    Take 1 tablet by mouth 3 (three) times daily.   ----------------------------------------------------------------------------------------------------------------------  Follow-up: Return in about 1 month (around 05/04/2018) for procedure.    Molli Barrows, MD

## 2018-04-27 ENCOUNTER — Telehealth: Payer: Self-pay | Admitting: Family Medicine

## 2018-04-27 NOTE — Telephone Encounter (Signed)
He has been discharged from this practice. Unfortunately, there's nothing I can do. He can go to the urgent care.

## 2018-04-27 NOTE — Telephone Encounter (Signed)
Copied from [place] [id number]. Topic: Inquiry >> Apr 27, 2018  2:24 PM Margot Ables wrote: Reason for CRM: pt has had a migraine since 4am. Pt has taken 4 imitrex today but nothing else. He drank a Coke. He is crying in pain. Pt is wanting something to be called in for him. Dottie said to call pt if needed at his cell [phone].  [id number] [address] [phone](Phone) [phone](Fax)

## 2018-05-04 ENCOUNTER — Other Ambulatory Visit: Payer: Self-pay | Admitting: Family Medicine

## 2018-05-04 ENCOUNTER — Ambulatory Visit: Payer: Managed Care, Other (non HMO) | Admitting: Family Medicine

## 2018-05-08 ENCOUNTER — Ambulatory Visit
Admission: RE | Admit: 2018-05-08 | Discharge: 2018-05-08 | Disposition: A | Discharge status: Home / Self Care | Payer: Managed Care, Other (non HMO) | Source: Ambulatory Visit | Attending: Anesthesiology | Admitting: Anesthesiology

## 2018-05-08 ENCOUNTER — Encounter: Payer: Self-pay | Admitting: Anesthesiology

## 2018-05-08 ENCOUNTER — Other Ambulatory Visit: Payer: Self-pay

## 2018-05-08 ENCOUNTER — Other Ambulatory Visit: Payer: Self-pay | Admitting: Anesthesiology

## 2018-05-08 ENCOUNTER — Ambulatory Visit (HOSPITAL_BASED_OUTPATIENT_CLINIC_OR_DEPARTMENT_OTHER): Payer: Managed Care, Other (non HMO) | Admitting: Anesthesiology

## 2018-05-08 VITALS — BP 125/87 | HR 73 | Temp 98.5°F | Resp 15 | Ht 70.0 in | Wt 200.0 lb

## 2018-05-08 DIAGNOSIS — Z79891 Long term (current) use of opiate analgesic: Secondary | ICD-10-CM

## 2018-05-08 DIAGNOSIS — M5432 Sciatica, left side: Secondary | ICD-10-CM | POA: Diagnosis not present

## 2018-05-08 DIAGNOSIS — M47817 Spondylosis without myelopathy or radiculopathy, lumbosacral region: Secondary | ICD-10-CM

## 2018-05-08 DIAGNOSIS — G894 Chronic pain syndrome: Secondary | ICD-10-CM | POA: Insufficient documentation

## 2018-05-08 DIAGNOSIS — M5136 Other intervertebral disc degeneration, lumbar region: Secondary | ICD-10-CM | POA: Insufficient documentation

## 2018-05-08 DIAGNOSIS — R52 Pain, unspecified: Secondary | ICD-10-CM

## 2018-05-08 DIAGNOSIS — G8929 Other chronic pain: Secondary | ICD-10-CM

## 2018-05-08 DIAGNOSIS — M5137 Other intervertebral disc degeneration, lumbosacral region: Secondary | ICD-10-CM | POA: Diagnosis not present

## 2018-05-08 DIAGNOSIS — Z79899 Other long term (current) drug therapy: Secondary | ICD-10-CM | POA: Diagnosis not present

## 2018-05-08 DIAGNOSIS — M545 Low back pain: Secondary | ICD-10-CM

## 2018-05-08 DIAGNOSIS — M47816 Spondylosis without myelopathy or radiculopathy, lumbar region: Secondary | ICD-10-CM

## 2018-05-08 MED ORDER — LIDOCAINE HCL (PF) 1 % IJ SOLN
5.0000 mL | Freq: Once | INTRAMUSCULAR | Status: AC
  Administered 2018-05-08: 5 mL via SUBCUTANEOUS
  Filled 2018-05-08: qty 5

## 2018-05-08 MED ORDER — SODIUM CHLORIDE 0.9 % IJ SOLN
INTRAMUSCULAR | Status: AC
  Filled 2018-05-08: qty 10

## 2018-05-08 MED ORDER — IOPAMIDOL (ISOVUE-M 200) INJECTION 41%
INTRAMUSCULAR | Status: AC
  Filled 2018-05-08: qty 10

## 2018-05-08 MED ORDER — SODIUM CHLORIDE 0.9% FLUSH
10.0000 mL | Freq: Once | INTRAVENOUS | Status: AC
  Administered 2018-05-08: 10 mL

## 2018-05-08 MED ORDER — IOPAMIDOL (ISOVUE-M 200) INJECTION 41%
20.0000 mL | Freq: Once | INTRAMUSCULAR | Status: DC | PRN
  Administered 2018-05-08: 20 mL
  Filled 2018-05-08: qty 20

## 2018-05-08 MED ORDER — TRIAMCINOLONE ACETONIDE 40 MG/ML IJ SUSP
40.0000 mg | Freq: Once | INTRAMUSCULAR | Status: AC
  Administered 2018-05-08: 40 mg
  Filled 2018-05-08: qty 1

## 2018-05-08 MED ORDER — HYDROCODONE-ACETAMINOPHEN 5-325 MG PO TABS: 1.0000 | ORAL_TABLET | Freq: Three times a day (TID) | ORAL | 0 refills | Status: DC

## 2018-05-08 MED ORDER — ROPIVACAINE HCL 2 MG/ML IJ SOLN
10.0000 mL | Freq: Once | INTRAMUSCULAR | Status: AC
  Administered 2018-05-08: 10 mL via EPIDURAL
  Filled 2018-05-08: qty 10

## 2018-05-08 NOTE — Progress Notes (Signed)
Subjective:  Patient ID: Stephen Wise, male    DOB: 1971/03/15  Age: 47 y.o. MRN: [id number]  CC: Back Pain (lower)   Procedure: L5-S1 lumbar epidural steroid and fluoroscopic guidance with no sedation  HPI Stephen Wise presents for evaluation.  He continues to have pain primarily in the left lower back with radiation into the left buttocks and into the left calf.  Occasional numbness and tingling affecting the left foot and periodically this radiates into the right lower back and right buttocks as well.  No change in lower extremity strength or function is noted at this time.  He is taking his Vicodin without difficulty as well.  No side effects are noted and he is continuing to get good relief with the medication but insufficient.  He has been doing physical therapy exercises and these do sometimes help but also can aggravate his pain.  Otherwise he is in his usual state of health and no new changes in bowel or bladder function.  Outpatient Medications Prior to Visit  Medication Sig Dispense Refill  . amphetamine-dextroamphetamine (ADDERALL XR) 20 MG 24 hr capsule Take 1 capsule (20 mg total) by mouth every morning. 30 capsule 0  . amphetamine-dextroamphetamine (ADDERALL) 10 MG tablet 5-16m qPM PRN on bad days 30 tablet 0  . esomeprazole (NEXIUM) 40 MG capsule Take 40 mg by mouth.    . mesalamine (PENTASA) 500 MG CR capsule Take 1,000 mg by mouth 3 (three) times daily as needed.     . nortriptyline (PAMELOR) 50 MG capsule Take 1 capsule (50 mg total) by mouth at bedtime. 30 capsule 3  . SUMAtriptan (IMITREX) 100 MG tablet Take 1 tablet (100 mg total) by mouth every 2 (two) hours as needed for migraine. May repeat in 2 hours if headache persists or recurs. 10 tablet 12  . HYDROcodone-acetaminophen (NORCO/VICODIN) 5-325 MG tablet Take 1 tablet by mouth 3 (three) times daily. 75 tablet 0  . sulfamethoxazole-trimethoprim (BACTRIM DS,SEPTRA DS) 800-160 MG tablet Take 1 tablet by mouth 2 (two) times  daily. (Patient not taking: Reported on 05/08/2018) 14 tablet 0   No facility-administered medications prior to visit.     Review of Systems CNS: No confusion or sedation Cardiac: No angina or palpitations GI: No abdominal pain or constipation Constitutional: No nausea vomiting fevers or chills  Objective:  BP 126/84   Pulse 73   Temp 98.5 F (36.9 C) (Oral)   Resp 12   Ht 5' 10"  (1.778 m)   Wt 200 lb (90.7 kg)   SpO2 99%   BMI 28.70 kg/m    BP Readings from Last 3 Encounters:  05/08/18 126/84  04/03/18 96/69  02/23/18 135/87     Wt Readings from Last 3 Encounters:  05/08/18 200 lb (90.7 kg)  04/03/18 195 lb (88.5 kg)  02/23/18 195 lb (88.5 kg)     Physical Exam Pt is alert and oriented PERRL EOMI HEART IS RRR no murmur or rub LCTA no wheezing or rales MUSCULOSKELETAL feels some tenderness in the lumbar paraspinous muscles but good strength and a positive straight leg raise on the left side negative on the right and good muscle tone and bulk  Labs  No results found for: HGBA1C No results found for: GLUF, MICROALBUR, LDLCALC, CREATININE  -------------------------------------------------------------------------------------------------------------------- No results found for: WBC, HGB, HCT, PLT, GLUCOSE, CHOL, TRIG, HDL, LDLDIRECT, LDLCALC, ALT, AST, NA, K, CL, CREATININE, BUN, CO2, TSH, PSA, INR, GLUF, HGBA1C, MICROALBUR  --------------------------------------------------------------------------------------------------------------------- No results found.   Assessment &  Plan:   Stephen Wise was seen today for back pain.  Diagnoses and all orders for this visit:  Lumbosacral spondylosis without myelopathy  Sciatica of left side  Chronic pain syndrome  Long term current use of opiate analgesic  Facet arthritis of lumbar region -     Lumbar Epidural Injection  Chronic left-sided low back pain without sciatica -     Lumbar Epidural Injection  Disc  disease, degenerative, lumbar or lumbosacral -     Lumbar Epidural Injection  Other orders -     HYDROcodone-acetaminophen (NORCO/VICODIN) 5-325 MG tablet; Take 1 tablet by mouth 3 (three) times daily. -     triamcinolone acetonide (KENALOG-40) injection 40 mg -     sodium chloride flush (NS) 0.9 % injection 10 mL -     ropivacaine (PF) 2 mg/mL (0.2%) (NAROPIN) injection 10 mL -     lidocaine (PF) (XYLOCAINE) 1 % injection 5 mL -     iopamidol (ISOVUE-M) 41 % intrathecal injection 20 mL        ----------------------------------------------------------------------------------------------------------------------  Problem List Items Addressed This Visit      Unprioritized   Chronic pain syndrome (Chronic)   Disc disease, degenerative, lumbar or lumbosacral (Chronic)   Relevant Medications   HYDROcodone-acetaminophen (NORCO/VICODIN) 5-325 MG tablet   triamcinolone acetonide (KENALOG-40) injection 40 mg   Long term current use of opiate analgesic (Chronic)    Other Visit Diagnoses    Lumbosacral spondylosis without myelopathy    -  Primary   Relevant Medications   HYDROcodone-acetaminophen (NORCO/VICODIN) 5-325 MG tablet   triamcinolone acetonide (KENALOG-40) injection 40 mg   Sciatica of left side       Facet arthritis of lumbar region       Relevant Medications   HYDROcodone-acetaminophen (NORCO/VICODIN) 5-325 MG tablet   triamcinolone acetonide (KENALOG-40) injection 40 mg   Chronic left-sided low back pain without sciatica       Relevant Medications   HYDROcodone-acetaminophen (NORCO/VICODIN) 5-325 MG tablet   triamcinolone acetonide (KENALOG-40) injection 40 mg        ----------------------------------------------------------------------------------------------------------------------  1. Lumbosacral spondylosis without myelopathy We will proceed with a lumbar epidural steroid injection for his left L5-S1 radiculitis.  We have gone over the risks and benefits of the  procedure with him in full detail all questions answered.  Warm return to clinic in 1 month for reevaluation.  He is to continue with core stretching strengthening exercises as previously reviewed.  2. Sciatica of left side As above  3. Chronic pain syndrome We will let him continue on his hydrocodone with refills given for August 31.  He did not bring his bottles in today and he will need to bring these in for release of this prescription.  We have reviewed the [facility] practitioner database information and it is appropriate.  4. Long term current use of opiate analgesic As above  5. Facet arthritis of lumbar region As above and continue core stretching strengthening exercises - Lumbar Epidural Injection  6. Chronic left-sided low back pain without sciatica  - Lumbar Epidural Injection  7. Disc disease, degenerative, lumbar or lumbosacral  - Lumbar Epidural Injection    ----------------------------------------------------------------------------------------------------------------------  I am having Stephen Wise maintain his mesalamine, SUMAtriptan, esomeprazole, nortriptyline, amphetamine-dextroamphetamine, amphetamine-dextroamphetamine, sulfamethoxazole-trimethoprim, and HYDROcodone-acetaminophen.   Meds ordered this encounter  Medications  . HYDROcodone-acetaminophen (NORCO/VICODIN) 5-325 MG tablet    Sig: Take 1 tablet by mouth 3 (three) times daily.    Dispense:  75 tablet    Refill:  0    Do not fill until [id number]  . triamcinolone acetonide (KENALOG-40) injection 40 mg  . sodium chloride flush (NS) 0.9 % injection 10 mL  . ropivacaine (PF) 2 mg/mL (0.2%) (NAROPIN) injection 10 mL  . lidocaine (PF) (XYLOCAINE) 1 % injection 5 mL  . iopamidol (ISOVUE-M) 41 % intrathecal injection 20 mL   Patient's Medications  New Prescriptions   No medications on file  Previous Medications   AMPHETAMINE-DEXTROAMPHETAMINE (ADDERALL XR) 20 MG 24 HR CAPSULE    Take 1 capsule (20  mg total) by mouth every morning.   AMPHETAMINE-DEXTROAMPHETAMINE (ADDERALL) 10 MG TABLET    5-19m qPM PRN on bad days   ESOMEPRAZOLE (NEXIUM) 40 MG CAPSULE    Take 40 mg by mouth.   MESALAMINE (PENTASA) 500 MG CR CAPSULE    Take 1,000 mg by mouth 3 (three) times daily as needed.    NORTRIPTYLINE (PAMELOR) 50 MG CAPSULE    Take 1 capsule (50 mg total) by mouth at bedtime.   SULFAMETHOXAZOLE-TRIMETHOPRIM (BACTRIM DS,SEPTRA DS) 800-160 MG TABLET    Take 1 tablet by mouth 2 (two) times daily.   SUMATRIPTAN (IMITREX) 100 MG TABLET    Take 1 tablet (100 mg total) by mouth every 2 (two) hours as needed for migraine. May repeat in 2 hours if headache persists or recurs.  Modified Medications   Modified Medication Previous Medication   HYDROCODONE-ACETAMINOPHEN (NORCO/VICODIN) 5-325 MG TABLET HYDROcodone-acetaminophen (NORCO/VICODIN) 5-325 MG tablet      Take 1 tablet by mouth 3 (three) times daily.    Take 1 tablet by mouth 3 (three) times daily.  Discontinued Medications   No medications on file   ----------------------------------------------------------------------------------------------------------------------  Follow-up: Return for evaluation, procedure.   Procedure: L5-S1 LESI with fluoroscopic guidance and no moderate sedation  NOTE: The risks, benefits, and expectations of the procedure have been discussed and explained to the patient who was understanding and in agreement with suggested treatment plan. No guarantees were made.  DESCRIPTION OF PROCEDURE: Lumbar epidural steroid injection with no IV Versed, EKG, blood pressure, pulse, and pulse oximetry monitoring. The procedure was performed with the patient in the prone position under fluoroscopic guidance.  Sterile prep x3 was initiated and I then injected subcutaneous lidocaine to the overlying L5-S1 site after its fluoroscopic identifictation.  Using strict aseptic technique, I then advanced an 18-gauge Tuohy epidural needle in the  midline using interlaminar approach via loss-of-resistance to saline technique. There was negative aspiration for heme or  CSF.  I then confirmed position with both AP and Lateral fluoroscan.  2 cc of Isovue were injected and a  total of 5 mL of Preservative-Free normal saline mixed with 40 mg of Kenalog and 1cc Ropicaine 0.2 percent were injected incrementally via the  epidurally placed needle. The needle was removed. The patient tolerated the injection well and was convalesced and discharged to home in stable condition. Should the patient have any post procedure difficulty they have been instructed on how to contact [place].    JMolli Barrows MD

## 2018-05-08 NOTE — Progress Notes (Signed)
Nursing Pain Medication Assessment:  Safety precautions to be maintained throughout the outpatient stay will include: orient to surroundings, keep bed in low position, maintain call bell within reach at all times, provide assistance with transfer out of bed and ambulation.  Medication Inspection Compliance: Stephen Wise did not comply with our request to bring his pills to be counted. He was reminded that bringing the medication bottles, even when empty, is a requirement.  Medication: None brought in. Pill/Patch Count: None available to be counted. Bottle Appearance: No container available. Did not bring bottle(s) to appointment. Filled Date: N/A Last Medication intake:  Today

## 2018-05-09 ENCOUNTER — Telehealth: Payer: Self-pay | Admitting: *Deleted

## 2018-05-09 NOTE — Telephone Encounter (Signed)
Denies complications post procedure. 

## 2018-05-10 ENCOUNTER — Ambulatory Visit: Payer: Managed Care, Other (non HMO) | Admitting: Family Medicine

## 2018-05-11 ENCOUNTER — Ambulatory Visit: Payer: Managed Care, Other (non HMO) | Admitting: Neurology

## 2018-06-01 NOTE — Progress Notes (Signed)
Nursing Pain Medication Assessment:  Safety precautions to be maintained throughout the outpatient stay will include: orient to surroundings, keep bed in low position, maintain call bell within reach at all times, provide assistance with transfer out of bed and ambulation.  Medication Inspection Compliance: Pill count conducted under aseptic conditions, in front of the patient. Neither the pills nor the bottle was removed from the patient's sight at any time. Once count was completed pills were immediately returned to the patient in their original bottle.  Medication: Hydrocodone/APAP Pill/Patch Count: 1 of 75 pills remain Pill/Patch Appearance: Markings consistent with prescribed medication Bottle Appearance: Standard pharmacy container. Clearly labeled. Filled Date: 08/ / 01 / 2019 Last Medication intake:  Today

## 2018-06-22 IMAGING — CR DG CERVICAL SPINE COMPLETE 4+V
1 series · 5 of 5 positions shown · non-contrast
Comparison: None.

CLINICAL DATA: Chronic cervicalgia with upper extremity tingling

EXAM:
CERVICAL SPINE - COMPLETE 4+ VIEW

[Series 1: dg cervical spine complete · 0.14mm/px · 5 of 5 slices shown]
[im 1/5]
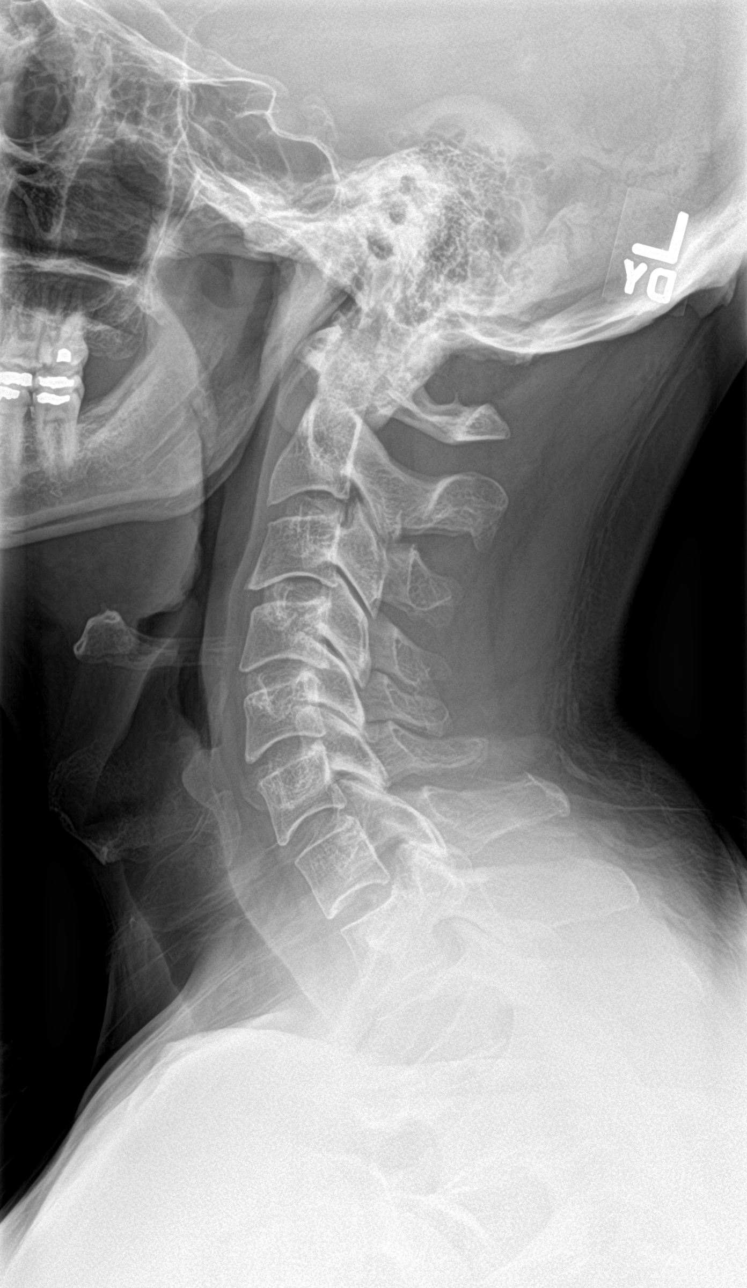
[im 2/5]
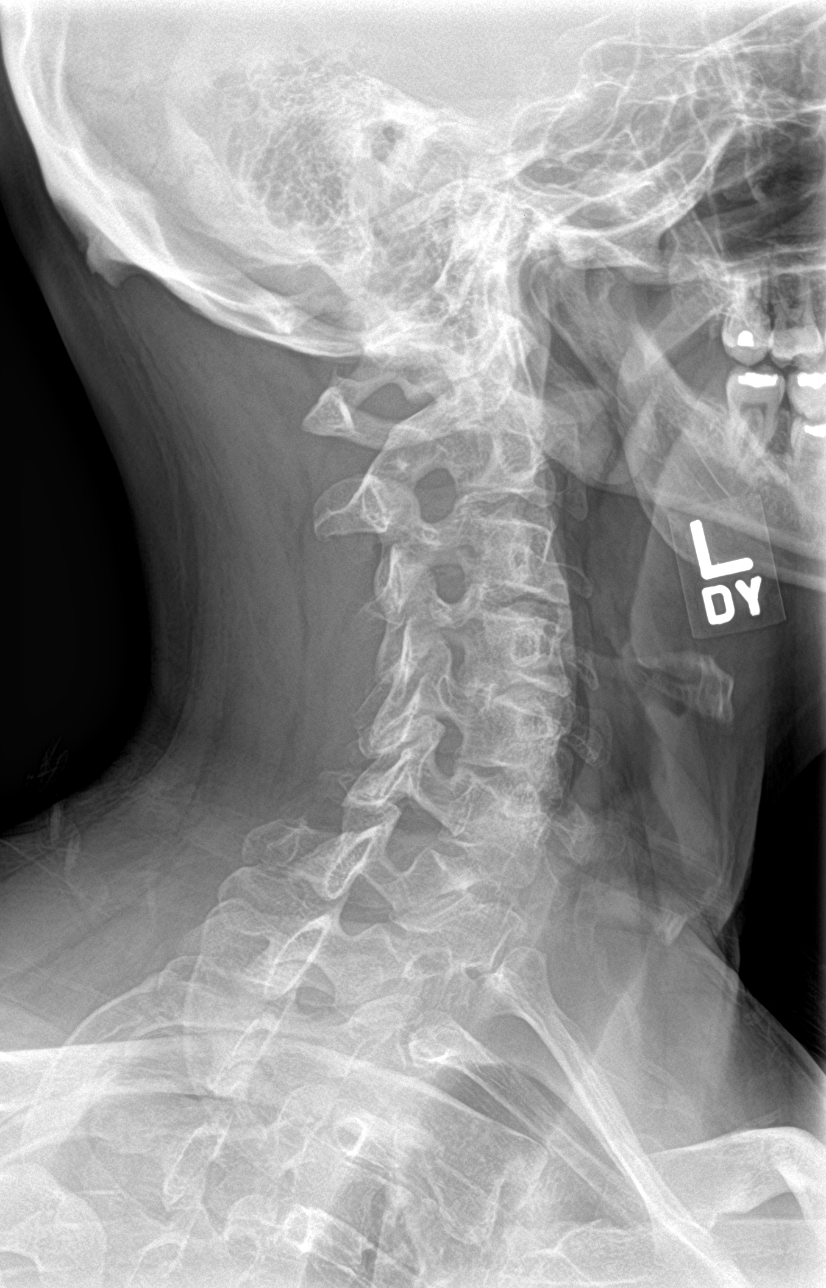
[im 3/5]
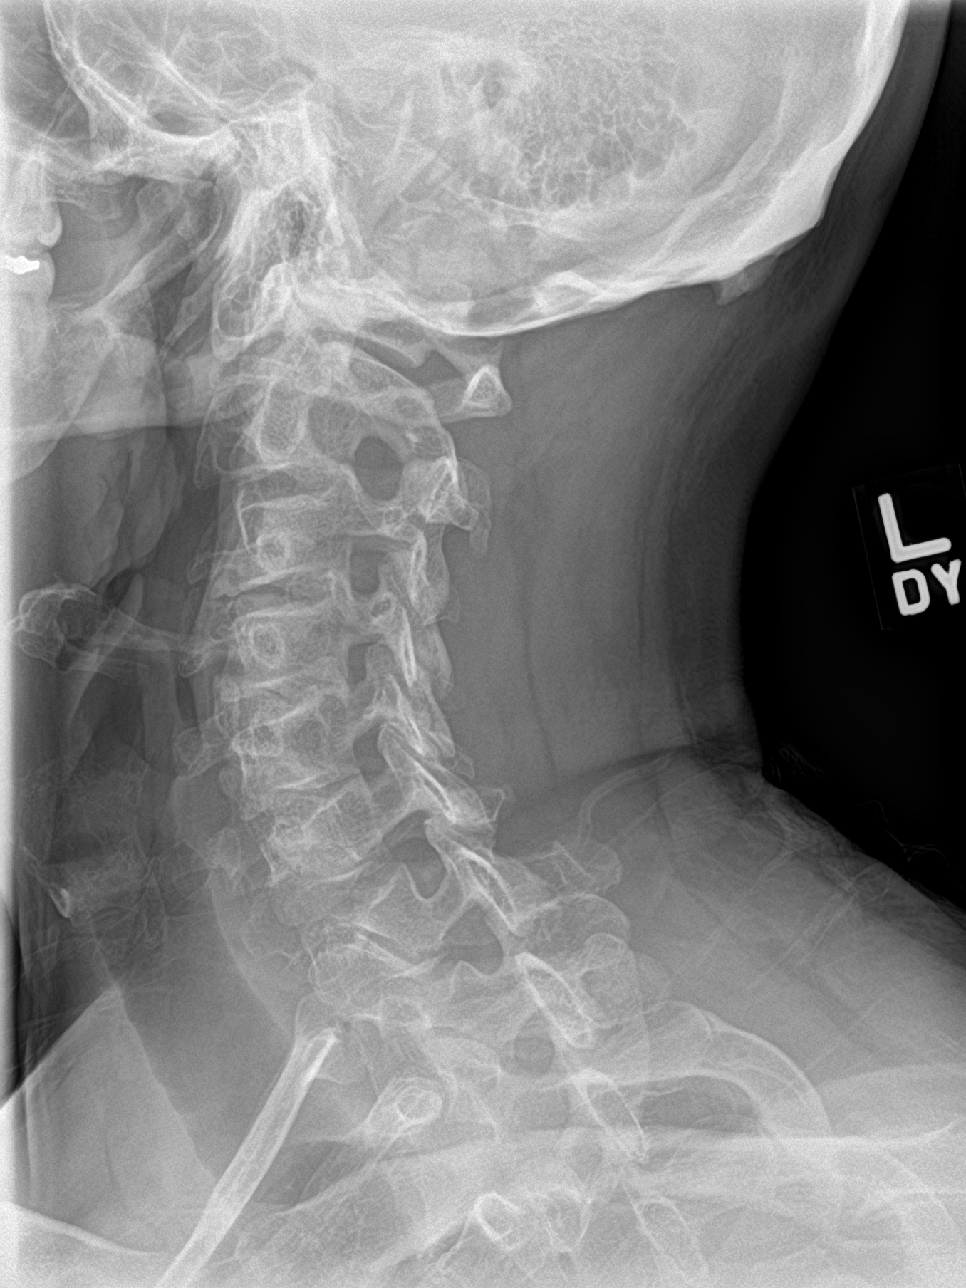
[im 4/5]
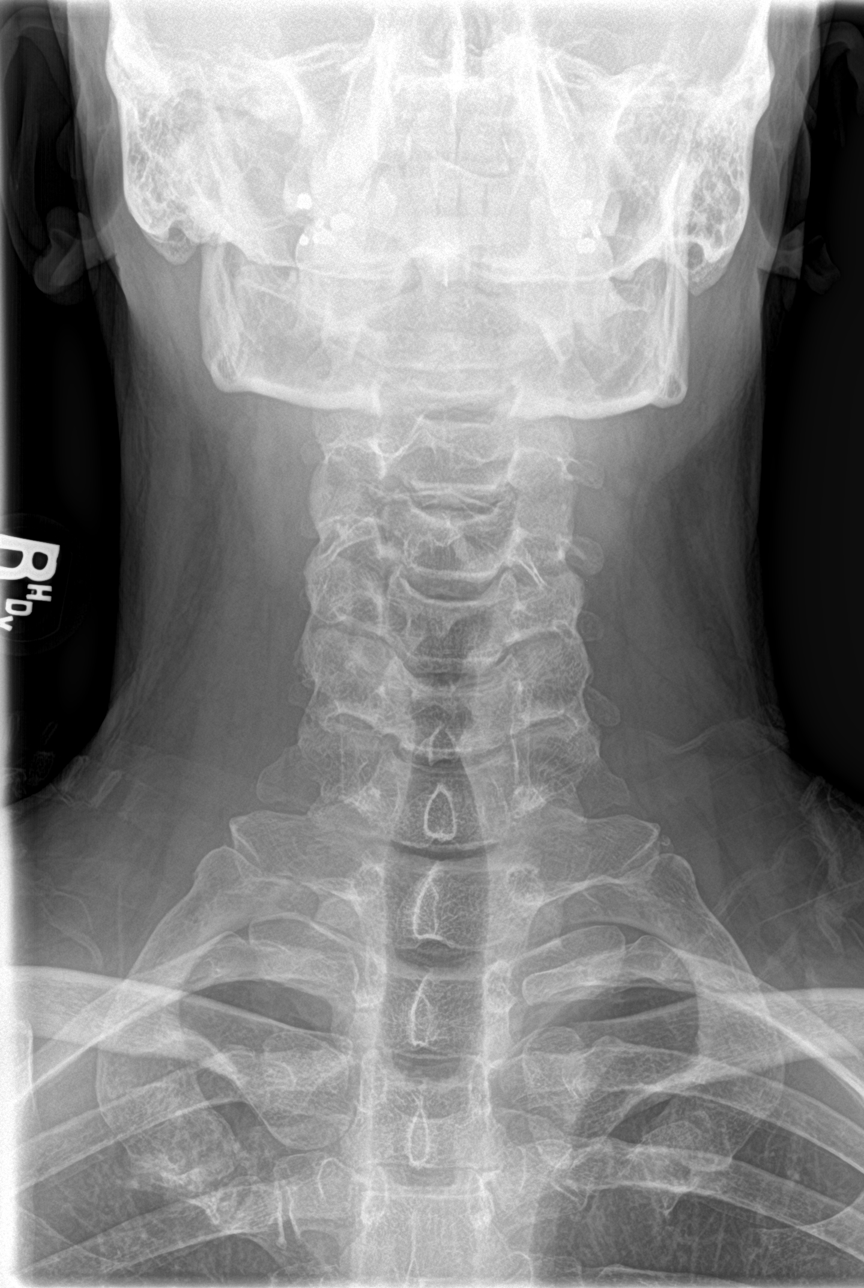
[im 5/5]
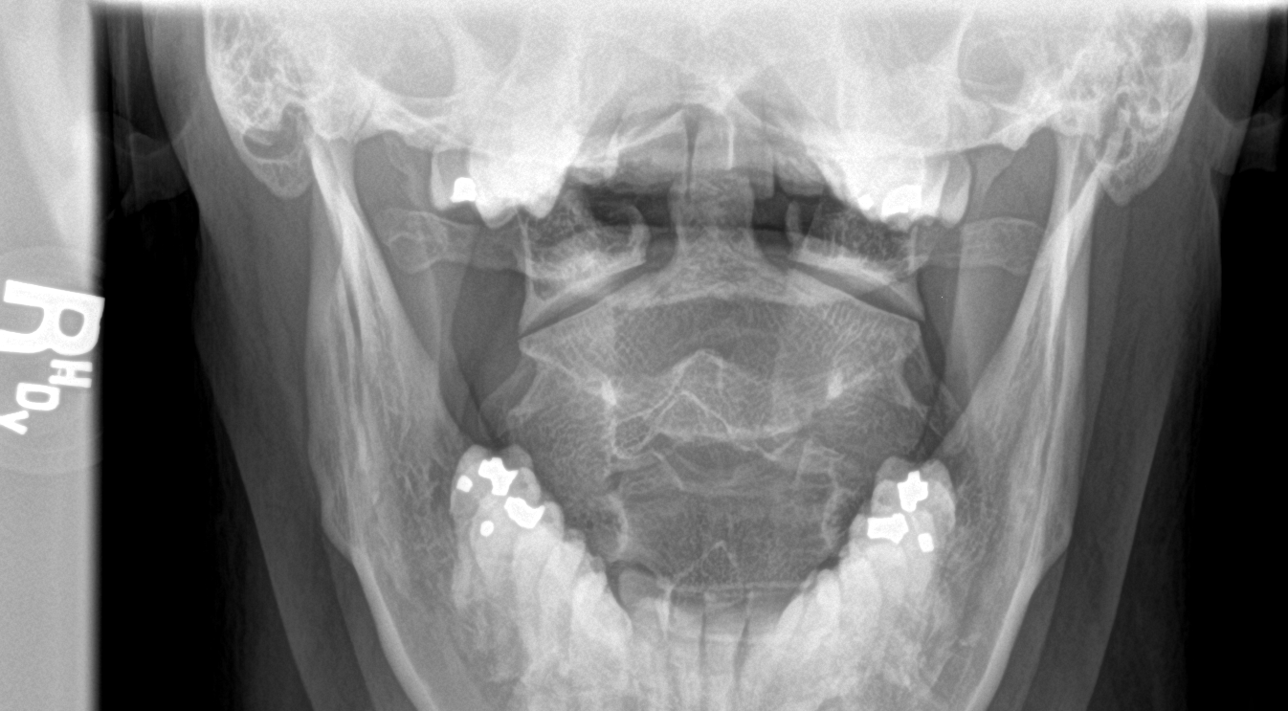

[5 of 5 positions shown; findings below may reference images not displayed]

FINDINGS: Frontal, lateral, open-mouth odontoid, and bilateral oblique views
were obtained. There is no fracture or spondylolisthesis.
Prevertebral soft tissues and predental space regions are normal.
The disc spaces appear unremarkable. There is no appreciable exit
foraminal narrowing on the oblique views. Lung apices are clear.
IMPRESSION: No fracture or spondylolisthesis.  No appreciable arthropathy.

## 2018-06-22 IMAGING — CR DG HAND 2V*R*
1 series · 2 of 2 positions shown · non-contrast
Comparison: None.

CLINICAL DATA: Chronic pain and tingling

EXAM:
RIGHT HAND - 2 VIEW

[Series 1: dg hand 2 view right · 0.14mm/px · 2 of 2 slices shown]
[im 1/2]
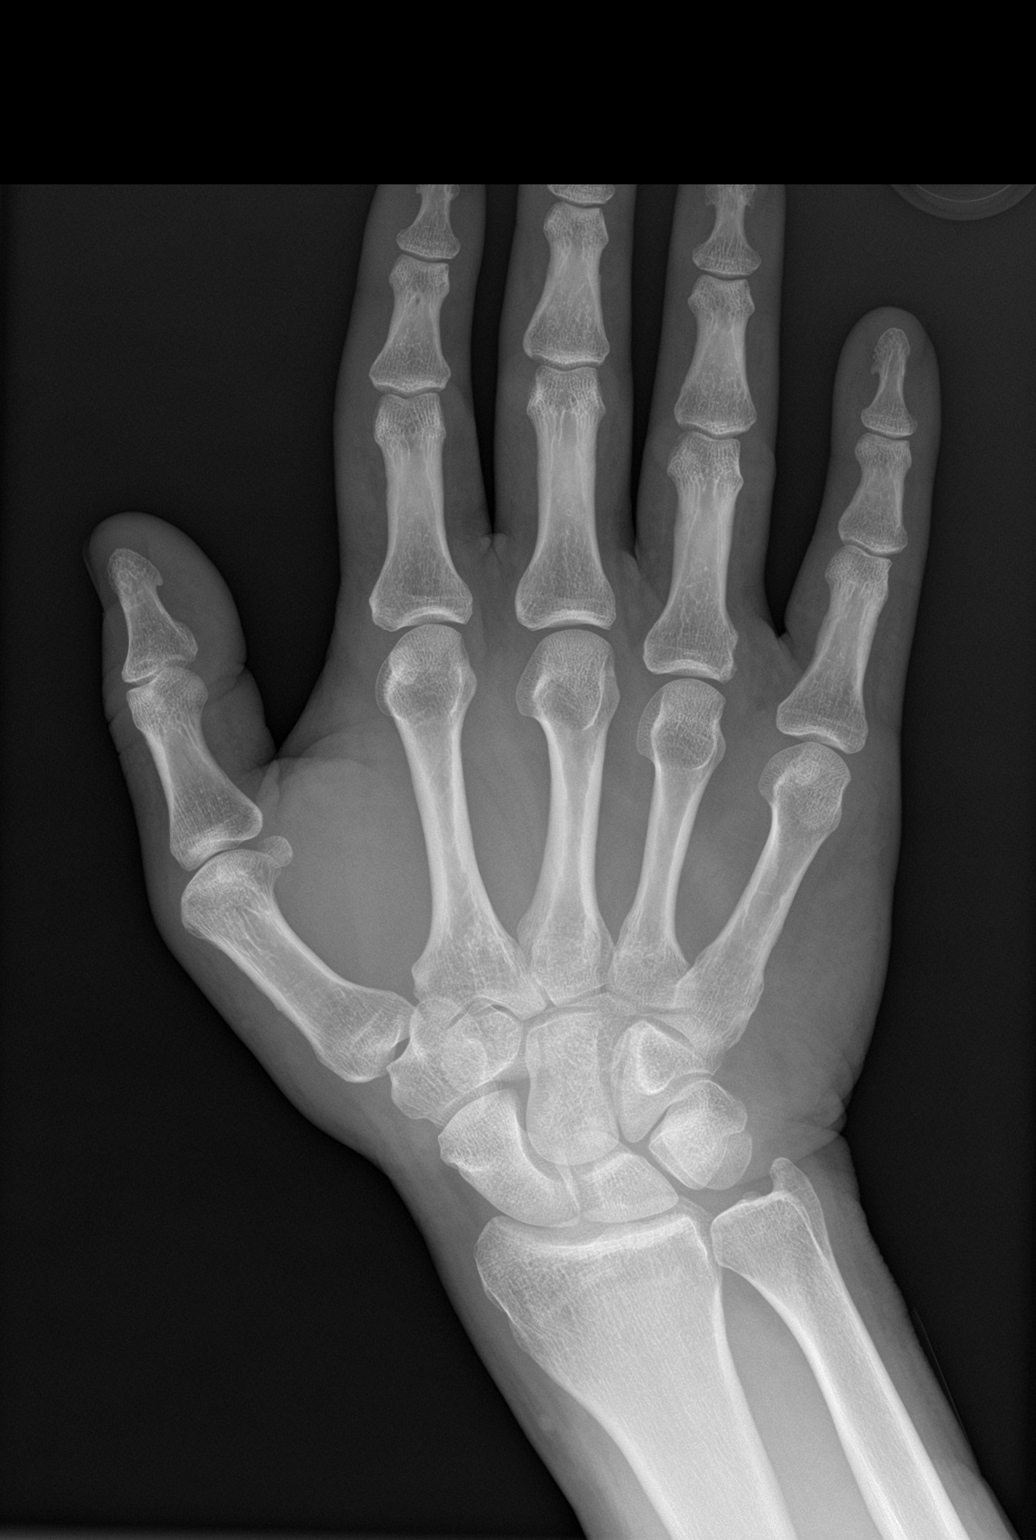
[im 2/2]
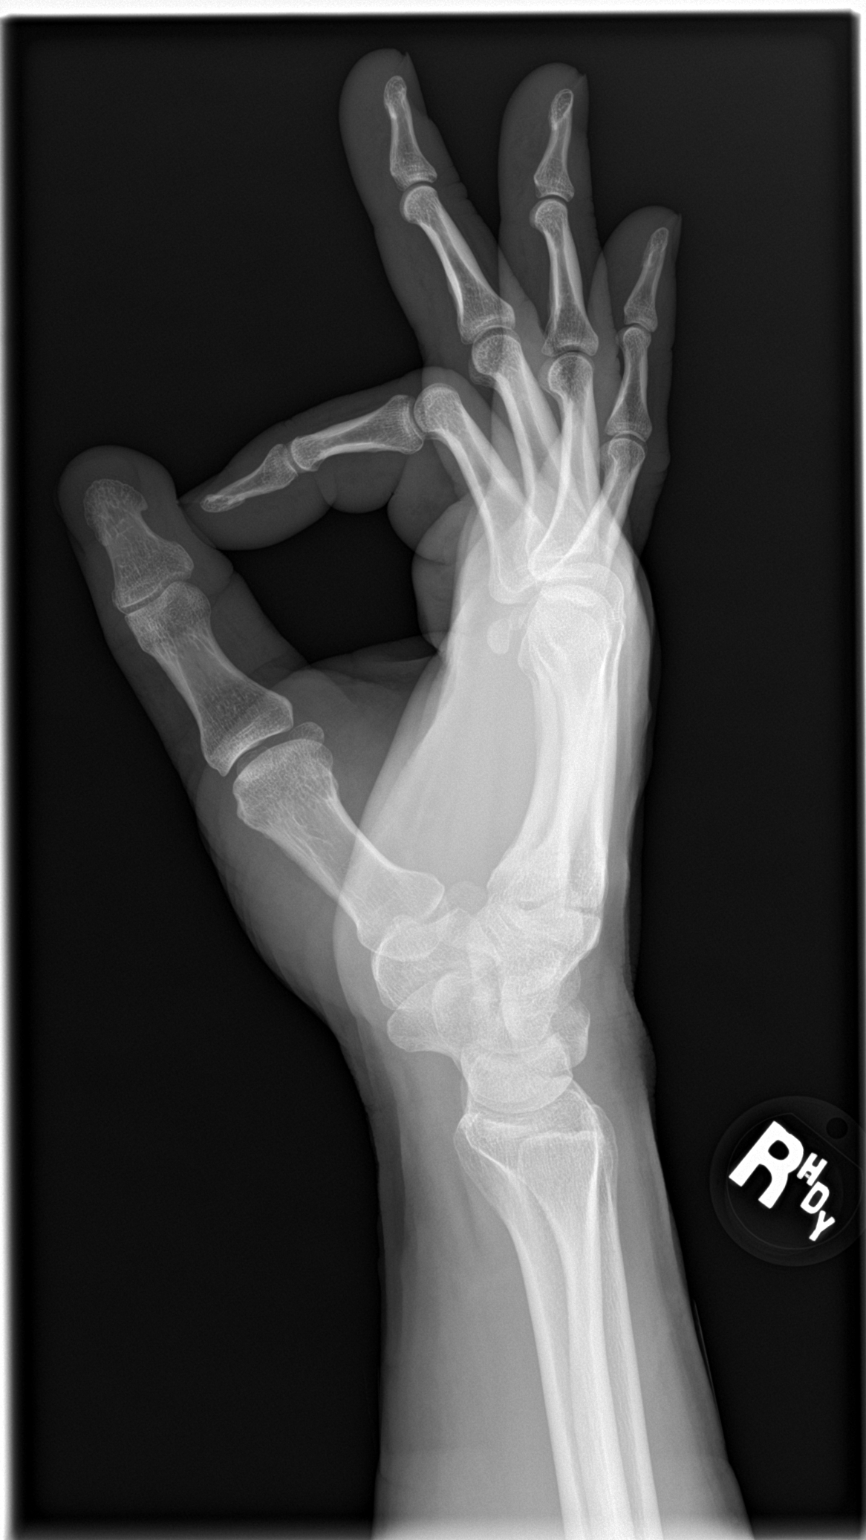

[2 of 2 positions shown; findings below may reference images not displayed]

FINDINGS: Frontal and lateral views were obtained. Bony mineralization is
normal. No fracture or dislocation. The joint spaces appear normal.
No erosive change or periostitis.
IMPRESSION: No appreciable arthropathy.  No fracture or dislocation.

## 2018-07-02 NOTE — Progress Notes (Deleted)
NEUROLOGY FOLLOW UP OFFICE NOTE  Stephen Wise [id number]  HISTORY OF PRESENT ILLNESS: Stephen Wise is a 47 year old male with adult ADHD, Crohn's disease, chronic pain syndrome/low back pain and migraines who follows up for migraines.  He is accompanied by his wife who supplements history.  ***  UPDATE: Intensity:  *** Duration:  *** Frequency:  *** Frequency of abortive medication: *** Current NSAIDS:  *** Current analgesics:  *** Current triptans:  Sumatriptan 158m Current ergotamine:  *** Current anti-emetic:  *** Current muscle relaxants:  *** Current anti-anxiolytic:  *** Current sleep aide:  *** Current Antihypertensive medications:  *** Current Antidepressant medications:  Nortriptyline 27mat bedtime Current Anticonvulsant medications:  *** Current anti-CGRP:  *** Current Vitamins/Herbal/Supplements:  *** Current Antihistamines/Decongestants:  *** Other therapy:  *** Other medication:  Adderall XR  Caffeine:  3 diet Mt Dews daily *** Alcohol:  no Smoker:  no Diet:   hydrates Exercise:  yes Depression:  no; Anxiety:  no Other pain:  Chronic pain Sleep hygiene:  good  HISTORY: Onset:  2013 Location:  Left temple but spreads to whole head Quality:  Stabbing, pounding Intensity:  severe.  He denies new headache, thunderclap headache or severe headache that wakes him from sleep. Aura:  Sometimes preceded with tunnel vision and flickering lights Prodrome:  no Postdrome:  no Associated symptoms:  Photophobia.  Rarely nausea.  He denies associated phonophobia or unilateral numbness or weakness. Duration:  Up to 3 hours or all day (brief with sumatriptan but often tries other medications first because he only gets 9 pills a month) Frequency:  1 to 2 days a week Frequency of abortive medication: every other day Triggers/exacerbating factors:  Emotional stress Relieving factors:  Ice pack, dark room Activity:  aggrvates He also has dull bifrontal non-throbbing  headaches about once a week, treated with Advil.  So he has a total of about 16 headache days a month.  MRI of brain without contrast from 06/30/17 was personally reviewed and was negative.  Past NSAIDS:  Advil, Motrin Past analgesics:  Excedrin Migraine (effective but aggravated Crohn's), Fioricet with codeine Past abortive triptans:  no Past muscle relaxants:  no Past anti-emetic:  no Past antihypertensive medications:  propranolol ER Past antidepressant medications:  Cymbalta 3072mast anticonvulsant medications:  topiramate Past vitamins/Herbal/Supplements:  no Past antihistamines/decongestants:  no Other past therapies:  no  Family history of headache:  Sister had migraines as a child    PAST MEDICAL HISTORY: Past Medical History:  Diagnosis Date  . Adult ADHD   . Chronic headaches   . Chronic pain   . Chronic prostatitis   . Condyloma acuminatum of penis   . [place]  with terminal ileitis  . ED (erectile dysfunction)   . Gastric ulcer   . Inflammatory bowel disease   . Microcytic anemia   . Peyronie's disease     MEDICATIONS: Current Outpatient Medications on File Prior to Visit  Medication Sig Dispense Refill  . amphetamine-dextroamphetamine (ADDERALL XR) 20 MG 24 hr capsule Take 1 capsule (20 mg total) by mouth every morning. 30 capsule 0  . amphetamine-dextroamphetamine (ADDERALL) 10 MG tablet 5-17m37mM PRN on bad days 30 tablet 0  . esomeprazole (NEXIUM) 40 MG capsule Take 40 mg by mouth.    . HYMarland KitchenROcodone-acetaminophen (NORCO/VICODIN) 5-325 MG tablet Take 1 tablet by mouth 3 (three) times daily. 75 tablet 0  . mesalamine (PENTASA) 500 MG CR capsule Take 1,000 mg by mouth 3 (  three) times daily as needed.     . nortriptyline (PAMELOR) 50 MG capsule Take 1 capsule (50 mg total) by mouth at bedtime. 30 capsule 3  . sulfamethoxazole-trimethoprim (BACTRIM DS,SEPTRA DS) 800-160 MG tablet Take 1 tablet by mouth 2 (two) times daily. (Patient not  taking: Reported on 05/08/2018) 14 tablet 0  . SUMAtriptan (IMITREX) 100 MG tablet Take 1 tablet (100 mg total) by mouth every 2 (two) hours as needed for migraine. May repeat in 2 hours if headache persists or recurs. 10 tablet 12   No current facility-administered medications on file prior to visit.     ALLERGIES: No Known Allergies  FAMILY HISTORY: Family History  Problem Relation Age of Onset  . Irritable bowel syndrome Mother   . Cirrhosis Father   . Alzheimer's disease Maternal Grandmother    SOCIAL HISTORY: Social History   Socioeconomic History  . Marital status: Married    Spouse name: Dottie  . Number of children: 4  . Years of education: Not on file  . Highest education level: Some college, no degree  Occupational History  . Occupation: Office manager  . Financial resource strain: Not on file  . Food insecurity:    Worry: Not on file    Inability: Not on file  . Transportation needs:    Medical: Not on file    Non-medical: Not on file  Tobacco Use  . Smoking status: Never Smoker  . Smokeless tobacco: Never Used  Substance and Sexual Activity  . Alcohol use: No  . Drug use: No  . Sexual activity: Yes  Lifestyle  . Physical activity:    Days per week: Not on file    Minutes per session: Not on file  . Stress: Not on file  Relationships  . Social connections:    Talks on phone: Not on file    Gets together: Not on file    Attends religious service: Not on file    Active member of club or organization: Not on file    Attends meetings of clubs or organizations: Not on file    Relationship status: Not on file  . Intimate partner violence:    Fear of current or ex partner: Not on file    Emotionally abused: Not on file    Physically abused: Not on file    Forced sexual activity: Not on file  Other Topics Concern  . Not on file  Social History Narrative   Pt is right handed. He is married, lives in a one story house with a basement. He drinks 3  sodas a day. He is very active at work.    REVIEW OF SYSTEMS: Constitutional: No fevers, chills, or sweats, no generalized fatigue, change in appetite Eyes: No visual changes, double vision, eye pain Ear, nose and throat: No hearing loss, ear pain, nasal congestion, sore throat Cardiovascular: No chest pain, palpitations Respiratory:  No shortness of breath at rest or with exertion, wheezes GastrointestinaI: No nausea, vomiting, diarrhea, abdominal pain, fecal incontinence Genitourinary:  No dysuria, urinary retention or frequency Musculoskeletal:  No neck pain, back pain Integumentary: No rash, pruritus, skin lesions Neurological: as above Psychiatric: No depression, insomnia, anxiety Endocrine: No palpitations, fatigue, diaphoresis, mood swings, change in appetite, change in weight, increased thirst Hematologic/Lymphatic:  No purpura, petechiae. Allergic/Immunologic: no itchy/runny eyes, nasal congestion, recent allergic reactions, rashes  PHYSICAL EXAM: *** General: No acute distress.  Patient appears ***-groomed.  *** body habitus. Head:  Normocephalic/atraumatic Eyes:  Fundi  examined but not visualized Neck: supple, no paraspinal tenderness, full range of motion Heart:  Regular rate and rhythm Lungs:  Clear to auscultation bilaterally Back: No paraspinal tenderness Neurological Exam: alert and oriented to person, place, and time. Attention span and concentration intact, recent and remote memory intact, fund of knowledge intact.  Speech fluent and not dysarthric, language intact.  CN II-XII intact. Bulk and tone normal, muscle strength 5/5 throughout.  Sensation to light touch, temperature and vibration intact.  Deep tendon reflexes 2+ throughout, toes downgoing.  Finger to nose and heel to shin testing intact.  Gait normal, Romberg negative.  IMPRESSION: *** migraine without aura, without status migrainosus, not intractable  PLAN: 1.  For preventative management, *** 2.  For  abortive therapy, *** 3.  Limit use of pain relievers to no more than 2 days out of week to prevent risk of rebound or medication-overuse headache. 4.  Keep headache diary 5.  Exercise, hydration, caffeine cessation, sleep hygiene, monitor for and avoid triggers 6.  Consider:  magnesium citrate 446m daily, riboflavin 4072mdaily, and coenzyme Q10 10071mhree times daily 7.  Follow up ***  AdaMetta ClinesO  CC:  [employer] LiterO

## 2018-07-03 ENCOUNTER — Ambulatory Visit: Payer: Managed Care, Other (non HMO) | Admitting: Neurology

## 2018-07-10 ENCOUNTER — Encounter: Payer: Self-pay | Admitting: Anesthesiology

## 2018-07-10 ENCOUNTER — Other Ambulatory Visit: Payer: Self-pay

## 2018-07-10 ENCOUNTER — Ambulatory Visit: Payer: Managed Care, Other (non HMO) | Attending: Anesthesiology | Admitting: Anesthesiology

## 2018-07-10 VITALS — BP 133/94 | HR 70 | Temp 98.0°F | Ht 70.0 in | Wt 203.0 lb

## 2018-07-10 DIAGNOSIS — M47817 Spondylosis without myelopathy or radiculopathy, lumbosacral region: Secondary | ICD-10-CM | POA: Insufficient documentation

## 2018-07-10 DIAGNOSIS — Z79891 Long term (current) use of opiate analgesic: Secondary | ICD-10-CM | POA: Insufficient documentation

## 2018-07-10 DIAGNOSIS — G894 Chronic pain syndrome: Secondary | ICD-10-CM | POA: Insufficient documentation

## 2018-07-10 DIAGNOSIS — M4316 Spondylolisthesis, lumbar region: Secondary | ICD-10-CM | POA: Diagnosis not present

## 2018-07-10 DIAGNOSIS — M545 Low back pain: Secondary | ICD-10-CM | POA: Diagnosis present

## 2018-07-10 DIAGNOSIS — M5432 Sciatica, left side: Secondary | ICD-10-CM | POA: Insufficient documentation

## 2018-07-10 DIAGNOSIS — Z79899 Other long term (current) drug therapy: Secondary | ICD-10-CM | POA: Insufficient documentation

## 2018-07-10 MED ORDER — HYDROCODONE-ACETAMINOPHEN 5-325 MG PO TABS: 1.0000 | ORAL_TABLET | Freq: Three times a day (TID) | ORAL | 0 refills | Status: DC

## 2018-07-10 MED ORDER — HYDROCODONE-ACETAMINOPHEN 5-325 MG PO TABS: 1.0000 | ORAL_TABLET | Freq: Four times a day (QID) | ORAL | 0 refills | Status: DC | PRN

## 2018-07-10 NOTE — Patient Instructions (Signed)
You were given 2 prescriptions for Hydrocodone.

## 2018-07-10 NOTE — Progress Notes (Signed)
Nursing Pain Medication Assessment:  Safety precautions to be maintained throughout the outpatient stay will include: orient to surroundings, keep bed in low position, maintain call bell within reach at all times, provide assistance with transfer out of bed and ambulation.  Medication Inspection Compliance: Pill count conducted under aseptic conditions, in front of the patient. Neither the pills nor the bottle was removed from the patient's sight at any time. Once count was completed pills were immediately returned to the patient in their original bottle.  Medication: Hydrocodone/APAP Pill/Patch Count: 0 of 75 pills remain Pill/Patch Appearance: Markings consistent with prescribed medication Bottle Appearance: Standard pharmacy container. Clearly labeled. Filled Date: 08 / 31 / 2019 Last Medication intake:  Yesterday

## 2018-07-10 NOTE — Progress Notes (Signed)
Subjective:  Patient ID: Stephen Wise, male    DOB: 26-Feb-1971  Age: 47 y.o. MRN: [id number]  CC: Back Pain (low) and Leg Pain (bilateral and lateral)   Procedure: None  HPI Eion Timbrook presents for evaluation.  He was last seen a few months ago and continues to have the same quality characteristic distribution of left lower back pain with left posterior lateral leg pain.  Otherwise she is in his usual state of health at this point.  He is having some more breakthrough pain during the day.  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  He takes his opioids generally late afternoon and early evening to help with pain that intensifies during the day.  No change in the quality characteristic or distribution of his low back pain are noted.  His strength is been stable as well with no changes in bowel or bladder function.  I have reviewed his narcotic assessment sheet and he continues to derive good functional lifestyle improvement with the medications.  He did receive an epidural steroid in the past that gave him approximately 5 to 7 days of relief but the pain was back to baseline after that.  Otherwise he is considered a nonsurgical candidate and I have reviewed again his MRI findings today.  Outpatient Medications Prior to Visit  Medication Sig Dispense Refill  . amphetamine-dextroamphetamine (ADDERALL XR) 20 MG 24 hr capsule Take 1 capsule (20 mg total) by mouth every morning. 30 capsule 0  . amphetamine-dextroamphetamine (ADDERALL) 10 MG tablet 5-51m qPM PRN on bad days 30 tablet 0  . esomeprazole (NEXIUM) 40 MG capsule Take 40 mg by mouth.    . mesalamine (PENTASA) 500 MG CR capsule Take 1,000 mg by mouth 3 (three) times daily as needed.     . nortriptyline (PAMELOR) 50 MG capsule Take 1 capsule (50 mg total) by mouth at bedtime. 30 capsule 3  . SUMAtriptan (IMITREX) 100 MG tablet Take 1 tablet (100 mg total) by mouth every 2 (two) hours  as needed for migraine. May repeat in 2 hours if headache persists or recurs. 10 tablet 12  . HYDROcodone-acetaminophen (NORCO/VICODIN) 5-325 MG tablet Take 1 tablet by mouth 3 (three) times daily. 75 tablet 0  . sulfamethoxazole-trimethoprim (BACTRIM DS,SEPTRA DS) 800-160 MG tablet Take 1 tablet by mouth 2 (two) times daily. (Patient not taking: Reported on 07/10/2018) 14 tablet 0   No facility-administered medications prior to visit.     Review of Systems CNS: No confusion or sedation Cardiac: No angina or palpitations GI: No abdominal pain or constipation Constitutional: No nausea vomiting fevers or chills  Objective:  BP (!) 133/94   Pulse 70   Temp 98 F (36.7 C) (Oral)   Ht 5' 10"  (1.778 m)   Wt 203 lb (92.1 kg)   SpO2 97%   BMI 29.13 kg/m    BP Readings from Last 3 Encounters:  07/10/18 (!) 133/94  05/08/18 125/87  04/03/18 96/69     Wt Readings from Last 3 Encounters:  07/10/18 203 lb (92.1 kg)  05/08/18 200 lb (90.7 kg)  04/03/18 195 lb (88.5 kg)     Physical Exam Pt is alert and oriented PERRL EOMI HEART IS RRR no murmur or rub LCTA no wheezing or rales MUSCULOSKELETAL reveals some paraspinous muscle tenderness but no overt trigger points.  He has some diffuse pain in the L3-L4-L5 region bilaterally.  He does have some pain  with extension at the low back and his lower extremity strength and function is at baseline.  Muscle tone and bulk is good.  Labs  No results found for: HGBA1C No results found for: GLUF, MICROALBUR, LDLCALC, CREATININE  -------------------------------------------------------------------------------------------------------------------- No results found for: WBC, HGB, HCT, PLT, GLUCOSE, CHOL, TRIG, HDL, LDLDIRECT, LDLCALC, ALT, AST, NA, K, CL, CREATININE, BUN, CO2, TSH, PSA, INR, GLUF, HGBA1C, MICROALBUR  --------------------------------------------------------------------------------------------------------------------- Dg C-arm 1-60  Min-no Report  Result Date: 05/08/2018 Fluoroscopy was utilized by the requesting physician.  No radiographic interpretation.     Assessment & Plan:   Ben was seen today for back pain and leg pain.  Diagnoses and all orders for this visit:  Lumbosacral spondylosis without myelopathy  Sciatica of left side  Chronic pain syndrome  Long term current use of opiate analgesic  Spondylolisthesis of lumbar region  Other orders -     Discontinue: HYDROcodone-acetaminophen (NORCO/VICODIN) 5-325 MG tablet; Take 1 tablet by mouth 3 (three) times daily. -     Discontinue: HYDROcodone-acetaminophen (NORCO/VICODIN) 5-325 MG tablet; Take 1 tablet by mouth 3 (three) times daily. -     Discontinue: HYDROcodone-acetaminophen (NORCO/VICODIN) 5-325 MG tablet; Take 1 tablet by mouth every 6 (six) hours as needed for moderate pain. -     HYDROcodone-acetaminophen (NORCO/VICODIN) 5-325 MG tablet; Take 1 tablet by mouth every 6 (six) hours as needed for moderate pain.        ----------------------------------------------------------------------------------------------------------------------  Problem List Items Addressed This Visit      Unprioritized   Chronic pain syndrome (Chronic)   Long term current use of opiate analgesic (Chronic)    Other Visit Diagnoses    Lumbosacral spondylosis without myelopathy    -  Primary   Relevant Medications   HYDROcodone-acetaminophen (NORCO/VICODIN) 5-325 MG tablet   Sciatica of left side       Spondylolisthesis of lumbar region            ----------------------------------------------------------------------------------------------------------------------  1. Lumbosacral spondylosis without myelopathy As he did not receive any significant prolonged duration of relief with the epidural steroid will defer on repeat injection today.  He is asked if there is anything we can do with his medications for better consistent control.  I am going to increase  his dose to 1 tablet 3 times daily on the Vicodin.  We have talked about risks and benefits of chronic opioid use.  Based on his history and narcotic assessment sheet he continues to derive good functional lifestyle improvement with the medicines I think it is appropriate to continue this especially in his line of work.  He does not have any sedation with the medicines or other side effects noted.  We talked about this potential risk as well.  2. Sciatica of left side We did talk about possibility for facet injection should his pain worsen.  I want him to continue with core stretching strengthening exercises once again reviewed today.  3. Chronic pain syndrome We have reviewed the [facility] practitioner database information and it is appropriate.  Refills will be given for October 8 and November 7.  4. Long term current use of opiate analgesic Above  5. Spondylolisthesis of lumbar region As above    ----------------------------------------------------------------------------------------------------------------------  I am having [name] maintain his mesalamine, SUMAtriptan, esomeprazole, nortriptyline, amphetamine-dextroamphetamine, amphetamine-dextroamphetamine, sulfamethoxazole-trimethoprim, and HYDROcodone-acetaminophen.   Meds ordered this encounter  Medications  . DISCONTD: HYDROcodone-acetaminophen (NORCO/VICODIN) 5-325 MG tablet    Sig: Take 1 tablet by mouth 3 (three) times daily.    Dispense:  75 tablet    Refill:  0    Do not fill until [id number]  . DISCONTD: HYDROcodone-acetaminophen (NORCO/VICODIN) 5-325 MG tablet    Sig: Take 1 tablet by mouth 3 (three) times daily.    Dispense:  75 tablet    Refill:  0    Do not fill until [id number]  . DISCONTD: HYDROcodone-acetaminophen (NORCO/VICODIN) 5-325 MG tablet    Sig: Take 1 tablet by mouth every 6 (six) hours as needed for moderate pain.    Dispense:  90 tablet    Refill:  0    Do not fill until [id number]  .  HYDROcodone-acetaminophen (NORCO/VICODIN) 5-325 MG tablet    Sig: Take 1 tablet by mouth every 6 (six) hours as needed for moderate pain.    Dispense:  90 tablet    Refill:  0    Do not fill until [id number]   Patient's Medications  New Prescriptions   No medications on file  Previous Medications   AMPHETAMINE-DEXTROAMPHETAMINE (ADDERALL XR) 20 MG 24 HR CAPSULE    Take 1 capsule (20 mg total) by mouth every morning.   AMPHETAMINE-DEXTROAMPHETAMINE (ADDERALL) 10 MG TABLET    5-35m qPM PRN on bad days   ESOMEPRAZOLE (NEXIUM) 40 MG CAPSULE    Take 40 mg by mouth.   MESALAMINE (PENTASA) 500 MG CR CAPSULE    Take 1,000 mg by mouth 3 (three) times daily as needed.    NORTRIPTYLINE (PAMELOR) 50 MG CAPSULE    Take 1 capsule (50 mg total) by mouth at bedtime.   SULFAMETHOXAZOLE-TRIMETHOPRIM (BACTRIM DS,SEPTRA DS) 800-160 MG TABLET    Take 1 tablet by mouth 2 (two) times daily.   SUMATRIPTAN (IMITREX) 100 MG TABLET    Take 1 tablet (100 mg total) by mouth every 2 (two) hours as needed for migraine. May repeat in 2 hours if headache persists or recurs.  Modified Medications   Modified Medication Previous Medication   HYDROCODONE-ACETAMINOPHEN (NORCO/VICODIN) 5-325 MG TABLET HYDROcodone-acetaminophen (NORCO/VICODIN) 5-325 MG tablet      Take 1 tablet by mouth every 6 (six) hours as needed for moderate pain.    Take 1 tablet by mouth 3 (three) times daily.  Discontinued Medications   No medications on file   ----------------------------------------------------------------------------------------------------------------------  Follow-up: Return in about 2 months (around 09/09/2018) for evaluation, med refill.    JMolli Barrows MD

## 2018-08-02 NOTE — Progress Notes (Deleted)
NEUROLOGY FOLLOW UP OFFICE NOTE  Stephen Wise [id number]  HISTORY OF PRESENT ILLNESS: Stephen Wise is a 47 year old male with adult ADHD, Crohn's disease, chronic pain syndrome/low back pain and migraines who follows up for migraines.  He is accompanied by his wife who supplements history.  ***  UPDATE: Intensity:  *** Duration:  *** Frequency:  *** Frequency of abortive medication: *** Current NSAIDS:  *** Current analgesics:  *** Current triptans:  Sumatriptan 175m Current ergotamine:  *** Current anti-emetic:  *** Current muscle relaxants:  *** Current anti-anxiolytic:  *** Current sleep aide:  *** Current Antihypertensive medications:  *** Current Antidepressant medications:  Nortriptyline 237mat bedtime Current Anticonvulsant medications:  *** Current anti-CGRP:  *** Current Vitamins/Herbal/Supplements:  *** Current Antihistamines/Decongestants:  *** Other therapy:  *** Other medication:  Adderall XR  Caffeine:  3 diet Mt Dews daily *** Alcohol:  no Smoker:  no Diet:   hydrates Exercise:  yes Depression:  no; Anxiety:  no Other pain:  Chronic pain Sleep hygiene:  good  HISTORY: Onset:  2013 Location:  Left temple but spreads to whole head Quality:  Stabbing, pounding Intensity:  severe.  He denies new headache, thunderclap headache or severe headache that wakes him from sleep. Aura:  Sometimes preceded with tunnel vision and flickering lights Prodrome:  no Postdrome:  no Associated symptoms:  Photophobia.  Rarely nausea.  He denies associated phonophobia or unilateral numbness or weakness. Duration:  Up to 3 hours or all day (brief with sumatriptan but often tries other medications first because he only gets 9 pills a month) Frequency:  1 to 2 days a week Frequency of abortive medication: every other day Triggers/exacerbating factors:  Emotional stress Relieving factors:  Ice pack, dark room Activity:  aggrvates He also has dull bifrontal non-throbbing  headaches about once a week, treated with Advil.  So he has a total of about 16 headache days a month.  MRI of brain without contrast from 06/30/17 was personally reviewed and was negative.  Past NSAIDS:  Advil, Motrin Past analgesics:  Excedrin Migraine (effective but aggravated Crohn's), Fioricet with codeine Past abortive triptans:  no Past muscle relaxants:  no Past anti-emetic:  no Past antihypertensive medications:  propranolol ER Past antidepressant medications:  Cymbalta 3066mast anticonvulsant medications:  topiramate Past vitamins/Herbal/Supplements:  no Past antihistamines/decongestants:  no Other past therapies:  no  Family history of headache:  Sister had migraines as a child    PAST MEDICAL HISTORY: Past Medical History:  Diagnosis Date  . Adult ADHD   . Chronic headaches   . Chronic pain   . Chronic prostatitis   . Condyloma acuminatum of penis   . [place]  with [place] ileitis  . ED (erectile dysfunction)   . Gastric ulcer   . Inflammatory bowel disease   . Microcytic anemia   . Peyronie's disease     MEDICATIONS: Current Outpatient Medications on File Prior to Visit  Medication Sig Dispense Refill  . amphetamine-dextroamphetamine (ADDERALL XR) 20 MG 24 hr capsule Take 1 capsule (20 mg total) by mouth every morning. 30 capsule 0  . amphetamine-dextroamphetamine (ADDERALL) 10 MG tablet 5-108m4mM PRN on bad days 30 tablet 0  . esomeprazole (NEXIUM) 40 MG capsule Take 40 mg by mouth.    . HYMarland KitchenROcodone-acetaminophen (NORCO/VICODIN) 5-325 MG tablet Take 1 tablet by mouth every 6 (six) hours as needed for moderate pain. 90 tablet 0  . mesalamine (PENTASA) 500 MG CR capsule Take  1,000 mg by mouth 3 (three) times daily as needed.     . nortriptyline (PAMELOR) 50 MG capsule Take 1 capsule (50 mg total) by mouth at bedtime. 30 capsule 3  . sulfamethoxazole-trimethoprim (BACTRIM DS,SEPTRA DS) 800-160 MG tablet Take 1 tablet by mouth 2 (two) times  daily. (Patient not taking: Reported on 07/10/2018) 14 tablet 0  . SUMAtriptan (IMITREX) 100 MG tablet Take 1 tablet (100 mg total) by mouth every 2 (two) hours as needed for migraine. May repeat in 2 hours if headache persists or recurs. 10 tablet 12   No current facility-administered medications on file prior to visit.     ALLERGIES: No Known Allergies  FAMILY HISTORY: Family History  Problem Relation Age of Onset  . Irritable bowel syndrome Mother   . Cirrhosis Father   . Alzheimer's disease Maternal Grandmother    SOCIAL HISTORY: Social History   Socioeconomic History  . Marital status: Married    Spouse name: Stephen Wise  . Number of children: 4  . Years of education: Not on file  . Highest education level: Some college, no degree  Occupational History  . Occupation: Office manager  . Financial resource strain: Not on file  . Food insecurity:    Worry: Not on file    Inability: Not on file  . Transportation needs:    Medical: Not on file    Non-medical: Not on file  Tobacco Use  . Smoking status: Never Smoker  . Smokeless tobacco: Never Used  Substance and Sexual Activity  . Alcohol use: No  . Drug use: No  . Sexual activity: Yes  Lifestyle  . Physical activity:    Days per week: Not on file    Minutes per session: Not on file  . Stress: Not on file  Relationships  . Social connections:    Talks on phone: Not on file    Gets together: Not on file    Attends religious service: Not on file    Active member of club or organization: Not on file    Attends meetings of clubs or organizations: Not on file    Relationship status: Not on file  . Intimate partner violence:    Fear of current or ex partner: Not on file    Emotionally abused: Not on file    Physically abused: Not on file    Forced sexual activity: Not on file  Other Topics Concern  . Not on file  Social History Narrative   Pt is right handed. He is married, lives in a one story house with a  basement. He drinks 3 sodas a day. He is very active at work.    REVIEW OF SYSTEMS: Constitutional: No fevers, chills, or sweats, no generalized fatigue, change in appetite Eyes: No visual changes, double vision, eye pain Ear, nose and throat: No hearing loss, ear pain, nasal congestion, sore throat Cardiovascular: No chest pain, palpitations Respiratory:  No shortness of breath at rest or with exertion, wheezes GastrointestinaI: No nausea, vomiting, diarrhea, abdominal pain, fecal incontinence Genitourinary:  No dysuria, urinary retention or frequency Musculoskeletal:  No neck pain, back pain Integumentary: No rash, pruritus, skin lesions Neurological: as above Psychiatric: No depression, insomnia, anxiety Endocrine: No palpitations, fatigue, diaphoresis, mood swings, change in appetite, change in weight, increased thirst Hematologic/Lymphatic:  No purpura, petechiae. Allergic/Immunologic: no itchy/runny eyes, nasal congestion, recent allergic reactions, rashes  PHYSICAL EXAM: *** General: No acute distress.  Patient appears ***-groomed.  *** body habitus. Head:  Normocephalic/atraumatic Eyes:  Fundi examined but not visualized Neck: supple, no paraspinal tenderness, full range of motion Heart:  Regular rate and rhythm Lungs:  Clear to auscultation bilaterally Back: No paraspinal tenderness Neurological Exam: alert and oriented to person, place, and time. Attention span and concentration intact, recent and remote memory intact, fund of knowledge intact.  Speech fluent and not dysarthric, language intact.  CN II-XII intact. Bulk and tone normal, muscle strength 5/5 throughout.  Sensation to light touch, temperature and vibration intact.  Deep tendon reflexes 2+ throughout, toes downgoing.  Finger to nose and heel to shin testing intact.  Gait normal, Romberg negative.  IMPRESSION: *** migraine without aura, without status migrainosus, not intractable  PLAN: 1.  For preventative  management, *** 2.  For abortive therapy, *** 3.  Limit use of pain relievers to no more than 2 days out of week to prevent risk of rebound or medication-overuse headache. 4.  Keep headache diary 5.  Exercise, hydration, caffeine cessation, sleep hygiene, monitor for and avoid triggers 6.  Consider:  magnesium citrate 479m daily, riboflavin 4064mdaily, and coenzyme Q10 10025mhree times daily 7.  Follow up ***  AdaMetta ClinesO  CC:  [employer]eg[employer] LiterO

## 2018-08-03 ENCOUNTER — Ambulatory Visit: Payer: Managed Care, Other (non HMO) | Admitting: Neurology

## 2018-08-15 NOTE — Progress Notes (Deleted)
NEUROLOGY FOLLOW UP OFFICE NOTE  Stephen Wise [id number]  HISTORY OF PRESENT ILLNESS: Stephen Wise is a 47 year old male with adult ADHD, Crohn's disease, chronic pain syndrome/low back pain and migraines who follows up for migraines.  He is accompanied by his wife who supplements history.  ***  UPDATE: Intensity:  *** Duration:  *** Frequency:  *** Frequency of abortive medication: *** Current NSAIDS:  *** Current analgesics:  *** Current triptans:  Sumatriptan 153m Current ergotamine:  *** Current anti-emetic:  *** Current muscle relaxants:  *** Current anti-anxiolytic:  *** Current sleep aide:  *** Current Antihypertensive medications:  *** Current Antidepressant medications:  Nortriptyline 213mat bedtime Current Anticonvulsant medications:  *** Current anti-CGRP:  *** Current Vitamins/Herbal/Supplements:  *** Current Antihistamines/Decongestants:  *** Other therapy:  *** Other medication:  Adderall XR  Caffeine:  3 diet Mt Dews daily *** Alcohol:  no Smoker:  no Diet:   hydrates Exercise:  yes Depression:  no; Anxiety:  no Other pain:  Chronic pain Sleep hygiene:  good  HISTORY: Onset:  2013 Location:  Left temple but spreads to whole head Quality:  Stabbing, pounding Intensity:  severe.  He denies new headache, thunderclap headache or severe headache that wakes him from sleep. Aura:  Sometimes preceded with tunnel vision and flickering lights Prodrome:  no Postdrome:  no Associated symptoms:  Photophobia.  Rarely nausea.  He denies associated phonophobia or unilateral numbness or weakness. Duration:  Up to 3 hours or all day (brief with sumatriptan but often tries other medications first because he only gets 9 pills a month) Frequency:  1 to 2 days a week Frequency of abortive medication: every other day Triggers/exacerbating factors:  Emotional stress Relieving factors:  Ice pack, dark room Activity:  aggrvates He also has dull bifrontal non-throbbing  headaches about once a week, treated with Advil.  So he has a total of about 16 headache days a month.  MRI of brain without contrast from 06/30/17 was personally reviewed and was negative.  Past NSAIDS:  Advil, Motrin Past analgesics:  Excedrin Migraine (effective but aggravated Crohn's), Fioricet with codeine Past abortive triptans:  no Past muscle relaxants:  no Past anti-emetic:  no Past antihypertensive medications:  propranolol ER Past antidepressant medications:  Cymbalta 3060mast anticonvulsant medications:  topiramate Past vitamins/Herbal/Supplements:  no Past antihistamines/decongestants:  no Other past therapies:  no  Family history of headache:  Sister had migraines as a child    PAST MEDICAL HISTORY: Past Medical History:  Diagnosis Date  . Adult ADHD   . Chronic headaches   . Chronic pain   . Chronic prostatitis   . Condyloma acuminatum of penis   . [place] disease ([place]  with terminal ileitis  . ED (erectile dysfunction)   . Gastric ulcer   . Inflammatory bowel disease   . Microcytic anemia   . Peyronie's disease     MEDICATIONS: Current Outpatient Medications on File Prior to Visit  Medication Sig Dispense Refill  . amphetamine-dextroamphetamine (ADDERALL XR) 20 MG 24 hr capsule Take 1 capsule (20 mg total) by mouth every morning. 30 capsule 0  . amphetamine-dextroamphetamine (ADDERALL) 10 MG tablet 5-81m60mM PRN on bad days 30 tablet 0  . esomeprazole (NEXIUM) 40 MG capsule Take 40 mg by mouth.    . HYMarland KitchenROcodone-acetaminophen (NORCO/VICODIN) 5-325 MG tablet Take 1 tablet by mouth every 6 (six) hours as needed for moderate pain. 90 tablet 0  . mesalamine (PENTASA) 500 MG CR capsule Take  1,000 mg by mouth 3 (three) times daily as needed.     . nortriptyline (PAMELOR) 50 MG capsule Take 1 capsule (50 mg total) by mouth at bedtime. 30 capsule 3  . sulfamethoxazole-trimethoprim (BACTRIM DS,SEPTRA DS) 800-160 MG tablet Take 1 tablet by mouth 2 (two) times  daily. (Patient not taking: Reported on 07/10/2018) 14 tablet 0  . SUMAtriptan (IMITREX) 100 MG tablet Take 1 tablet (100 mg total) by mouth every 2 (two) hours as needed for migraine. May repeat in 2 hours if headache persists or recurs. 10 tablet 12   No current facility-administered medications on file prior to visit.     ALLERGIES: No Known Allergies  FAMILY HISTORY: Family History  Problem Relation Age of Onset  . Irritable bowel syndrome Mother   . Cirrhosis Father   . Alzheimer's disease Maternal Grandmother    SOCIAL HISTORY: Social History   Socioeconomic History  . Marital status: Married    Spouse name: Dottie  . Number of children: 4  . Years of education: Not on file  . Highest education level: Some college, no degree  Occupational History  . Occupation: Office manager  . Financial resource strain: Not on file  . Food insecurity:    Worry: Not on file    Inability: Not on file  . Transportation needs:    Medical: Not on file    Non-medical: Not on file  Tobacco Use  . Smoking status: Never Smoker  . Smokeless tobacco: Never Used  Substance and Sexual Activity  . Alcohol use: No  . Drug use: No  . Sexual activity: Yes  Lifestyle  . Physical activity:    Days per week: Not on file    Minutes per session: Not on file  . Stress: Not on file  Relationships  . Social connections:    Talks on phone: Not on file    Gets together: Not on file    Attends religious service: Not on file    Active member of club or organization: Not on file    Attends meetings of clubs or organizations: Not on file    Relationship status: Not on file  . Intimate partner violence:    Fear of current or ex partner: Not on file    Emotionally abused: Not on file    Physically abused: Not on file    Forced sexual activity: Not on file  Other Topics Concern  . Not on file  Social History Narrative   Pt is right handed. He is married, lives in a one story house with a  basement. He drinks 3 sodas a day. He is very active at work.    REVIEW OF SYSTEMS: Constitutional: No fevers, chills, or sweats, no generalized fatigue, change in appetite Eyes: No visual changes, double vision, eye pain Ear, nose and throat: No hearing loss, ear pain, nasal congestion, sore throat Cardiovascular: No chest pain, palpitations Respiratory:  No shortness of breath at rest or with exertion, wheezes GastrointestinaI: No nausea, vomiting, diarrhea, abdominal pain, fecal incontinence Genitourinary:  No dysuria, urinary retention or frequency Musculoskeletal:  No neck pain, back pain Integumentary: No rash, pruritus, skin lesions Neurological: as above Psychiatric: No depression, insomnia, anxiety Endocrine: No palpitations, fatigue, diaphoresis, mood swings, change in appetite, change in weight, increased thirst Hematologic/Lymphatic:  No purpura, petechiae. Allergic/Immunologic: no itchy/runny eyes, nasal congestion, recent allergic reactions, rashes  PHYSICAL EXAM: *** General: No acute distress.  Patient appears ***-groomed.  *** body habitus. Head:  Normocephalic/atraumatic Eyes:  Fundi examined but not visualized Neck: supple, no paraspinal tenderness, full range of motion Heart:  Regular rate and rhythm Lungs:  Clear to auscultation bilaterally Back: No paraspinal tenderness Neurological Exam: alert and oriented to person, place, and time. Attention span and concentration intact, recent and remote memory intact, fund of knowledge intact.  Speech fluent and not dysarthric, language intact.  CN II-XII intact. Bulk and tone normal, muscle strength 5/5 throughout.  Sensation to light touch, temperature and vibration intact.  Deep tendon reflexes 2+ throughout, toes downgoing.  Finger to nose and heel to shin testing intact.  Gait normal, Romberg negative.  IMPRESSION: *** migraine without aura, without status migrainosus, not intractable  PLAN: 1.  For preventative  management, *** 2.  For abortive therapy, *** 3.  Limit use of pain relievers to no more than 2 days out of week to prevent risk of rebound or medication-overuse headache. 4.  Keep headache diary 5.  Exercise, hydration, caffeine cessation, sleep hygiene, monitor for and avoid triggers 6.  Consider:  magnesium citrate 453m daily, riboflavin 4044mdaily, and coenzyme Q10 1008mhree times daily 7.  Follow up ***  AdaMetta ClinesO  CC:  [employer] LiterO

## 2018-08-16 ENCOUNTER — Ambulatory Visit: Payer: Managed Care, Other (non HMO) | Admitting: Neurology

## 2018-08-17 ENCOUNTER — Other Ambulatory Visit: Payer: Self-pay | Admitting: Family Medicine

## 2018-08-24 ENCOUNTER — Telehealth: Payer: Self-pay | Admitting: Neurology

## 2018-08-24 DIAGNOSIS — G43109 Migraine with aura, not intractable, without status migrainosus: Secondary | ICD-10-CM

## 2018-08-24 MED ORDER — SUMATRIPTAN SUCCINATE 100 MG PO TABS: 100.0000 mg | ORAL_TABLET | Freq: Once | ORAL | 3 refills | Status: DC | PRN

## 2018-08-24 NOTE — Telephone Encounter (Signed)
Called and advised Pt Rx sent in

## 2018-08-24 NOTE — Telephone Encounter (Signed)
Patient needs his imitrex called in to the [facility] in [place]

## 2018-09-10 ENCOUNTER — Other Ambulatory Visit: Payer: Self-pay

## 2018-09-10 ENCOUNTER — Ambulatory Visit: Payer: Managed Care, Other (non HMO) | Attending: Anesthesiology | Admitting: Anesthesiology

## 2018-09-10 ENCOUNTER — Encounter: Payer: Self-pay | Admitting: Anesthesiology

## 2018-09-10 VITALS — BP 133/85 | HR 80 | Temp 98.1°F | Resp 18 | Ht 70.5 in | Wt 200.0 lb

## 2018-09-10 DIAGNOSIS — M47817 Spondylosis without myelopathy or radiculopathy, lumbosacral region: Secondary | ICD-10-CM

## 2018-09-10 DIAGNOSIS — M542 Cervicalgia: Secondary | ICD-10-CM | POA: Diagnosis not present

## 2018-09-10 DIAGNOSIS — G894 Chronic pain syndrome: Secondary | ICD-10-CM

## 2018-09-10 DIAGNOSIS — M47816 Spondylosis without myelopathy or radiculopathy, lumbar region: Secondary | ICD-10-CM

## 2018-09-10 DIAGNOSIS — M4316 Spondylolisthesis, lumbar region: Secondary | ICD-10-CM | POA: Diagnosis not present

## 2018-09-10 DIAGNOSIS — Z79891 Long term (current) use of opiate analgesic: Secondary | ICD-10-CM | POA: Diagnosis not present

## 2018-09-10 DIAGNOSIS — M549 Dorsalgia, unspecified: Secondary | ICD-10-CM | POA: Diagnosis present

## 2018-09-10 DIAGNOSIS — M5432 Sciatica, left side: Secondary | ICD-10-CM | POA: Diagnosis not present

## 2018-09-10 DIAGNOSIS — Z79899 Other long term (current) drug therapy: Secondary | ICD-10-CM | POA: Insufficient documentation

## 2018-09-10 MED ORDER — HYDROCODONE-ACETAMINOPHEN 5-325 MG PO TABS: 1.0000 | ORAL_TABLET | Freq: Four times a day (QID) | ORAL | 0 refills | Status: DC | PRN

## 2018-09-10 NOTE — Progress Notes (Signed)
Safety precautions to be maintained throughout the outpatient stay will include: orient to surroundings, keep bed in low position, maintain call bell within reach at all times, provide assistance with transfer out of bed and ambulation.   Nursing Pain Medication Assessment:  Safety precautions to be maintained throughout the outpatient stay will include: orient to surroundings, keep bed in low position, maintain call bell within reach at all times, provide assistance with transfer out of bed and ambulation.  Medication Inspection Compliance: Pill count conducted under aseptic conditions, in front of the patient. Neither the pills nor the bottle was removed from the patient's sight at any time. Once count was completed pills were immediately returned to the patient in their original bottle.  Medication: Hydrocodone/APAP Pill/Patch Count: 1 of 90 pills remain Pill/Patch Appearance: Markings consistent with prescribed medication Bottle Appearance: Standard pharmacy container. Clearly labeled. Filled Date: 10 / 8 / 2019 Last Medication intake:  Today

## 2018-09-10 NOTE — Patient Instructions (Signed)
You have been given 2 prescriptions for Hydrocodone to last until 11/09/2018.

## 2018-09-11 NOTE — Progress Notes (Signed)
Subjective:  Patient ID: Stephen Wise, male    DOB: 11/25/1970  Age: 47 y.o. MRN: [id number]  CC: Back Pain   Procedure: None  HPI Kavontae Pritchard presents for reevaluation.  He was last seen approximately 2 months ago and has been doing well with his hydrocodone dosing.  This is giving him good relief.  Based on his narcotic assessment sheet he is tolerating this well with good pain relief and deriving good functional benefit from the medication and no side effects are noted.  Otherwise the quality characteristic distribution of his upper and lower extremity pain are at baseline without change.  His strength is doing well.  He is doing his physical therapy exercises as tolerated.  Outpatient Medications Prior to Visit  Medication Sig Dispense Refill  . amphetamine-dextroamphetamine (ADDERALL XR) 20 MG 24 hr capsule Take 1 capsule (20 mg total) by mouth every morning. 30 capsule 0  . esomeprazole (NEXIUM) 40 MG capsule Take 40 mg by mouth.    . mesalamine (PENTASA) 500 MG CR capsule Take 1,000 mg by mouth 3 (three) times daily as needed.     . SUMAtriptan (IMITREX) 100 MG tablet Take 1 tablet (100 mg total) by mouth every 2 (two) hours as needed for migraine. May repeat in 2 hours if headache persists or recurs. 10 tablet 12  . SUMAtriptan (IMITREX) 100 MG tablet Take 1 tablet (100 mg total) by mouth once as needed for up to 1 dose for migraine. May repeat in 2 hours if headache persists or recurs. 10 tablet 3  . HYDROcodone-acetaminophen (NORCO/VICODIN) 5-325 MG tablet Take 1 tablet by mouth every 6 (six) hours as needed for moderate pain. 90 tablet 0  . amphetamine-dextroamphetamine (ADDERALL) 10 MG tablet 5-8m qPM PRN on bad days (Patient not taking: Reported on 09/10/2018) 30 tablet 0  . nortriptyline (PAMELOR) 50 MG capsule Take 1 capsule (50 mg total) by mouth at bedtime. (Patient not taking: Reported on 09/10/2018) 30 capsule 3  . sulfamethoxazole-trimethoprim (BACTRIM DS,SEPTRA DS) 800-160 MG  tablet Take 1 tablet by mouth 2 (two) times daily. (Patient not taking: Reported on 09/10/2018) 14 tablet 0   No facility-administered medications prior to visit.     Review of Systems CNS: No confusion or sedation Cardiac: No angina or palpitations GI: No abdominal pain or constipation Constitutional: No nausea vomiting fevers or chills  Objective:  BP 133/85   Pulse 80   Temp 98.1 F (36.7 C)   Resp 18   Ht 5' 10.5" (1.791 m)   Wt 200 lb (90.7 kg)   SpO2 99%   BMI 28.29 kg/m    BP Readings from Last 3 Encounters:  09/10/18 133/85  07/10/18 (!) 133/94  05/08/18 125/87     Wt Readings from Last 3 Encounters:  09/10/18 200 lb (90.7 kg)  07/10/18 203 lb (92.1 kg)  05/08/18 200 lb (90.7 kg)     Physical Exam Pt is alert and oriented PERRL EOMI HEART IS RRR no murmur or rub LCTA no wheezing or rales MUSCULOSKELETAL reveals some paraspinous muscle tenderness in the neck and low back region but no overt trigger points noted his muscle tone and bulk are good.  Labs  No results found for: HGBA1C No results found for: GLUF, MICROALBUR, LDLCALC, CREATININE  -------------------------------------------------------------------------------------------------------------------- No results found for: WBC, HGB, HCT, PLT, GLUCOSE, CHOL, TRIG, HDL, LDLDIRECT, LDLCALC, ALT, AST, NA, K, CL, CREATININE, BUN, CO2, TSH, PSA, INR, GLUF, HGBA1C, MICROALBUR  --------------------------------------------------------------------------------------------------------------------- Dg C-arm 1-60 Min-no Report  Result Date: 05/08/2018 Fluoroscopy was utilized by the requesting physician.  No radiographic interpretation.     Assessment & Plan:   Ward was seen today for back pain.  Diagnoses and all orders for this visit:  Lumbosacral spondylosis without myelopathy  Sciatica of left side  Chronic pain syndrome  Long term current use of opiate analgesic  Spondylolisthesis of lumbar  region  Facet arthritis of lumbar region  Neck pain  Other orders -     Discontinue: HYDROcodone-acetaminophen (NORCO/VICODIN) 5-325 MG tablet; Take 1 tablet by mouth every 6 (six) hours as needed for moderate pain. -     HYDROcodone-acetaminophen (NORCO/VICODIN) 5-325 MG tablet; Take 1 tablet by mouth every 6 (six) hours as needed for moderate pain.        ----------------------------------------------------------------------------------------------------------------------  Problem List Items Addressed This Visit      Unprioritized   Chronic pain syndrome (Chronic)   Long term current use of opiate analgesic (Chronic)   Neck pain (Chronic)    Other Visit Diagnoses    Lumbosacral spondylosis without myelopathy    -  Primary   Relevant Medications   HYDROcodone-acetaminophen (NORCO/VICODIN) 5-325 MG tablet   Sciatica of left side       Spondylolisthesis of lumbar region       Facet arthritis of lumbar region       Relevant Medications   HYDROcodone-acetaminophen (NORCO/VICODIN) 5-325 MG tablet        ----------------------------------------------------------------------------------------------------------------------  1. Lumbosacral spondylosis without myelopathy Continue core stretching strengthening as previously discussed and reviewed today.  We talked about some exercises as well.  I refilled his medications for December 9 and January 8 of next year.  2. Sciatica of left side As above  3. Chronic pain syndrome Have reviewed the [facility] practitioner database information and it is appropriate.  4. Long term current use of opiate analgesic As above  5. Spondylolisthesis of lumbar region   6. Facet arthritis of lumbar region   7. Neck pain     ----------------------------------------------------------------------------------------------------------------------  I am having [name] maintain his mesalamine, SUMAtriptan, esomeprazole,  nortriptyline, amphetamine-dextroamphetamine, amphetamine-dextroamphetamine, sulfamethoxazole-trimethoprim, SUMAtriptan, and HYDROcodone-acetaminophen.   Meds ordered this encounter  Medications  . DISCONTD: HYDROcodone-acetaminophen (NORCO/VICODIN) 5-325 MG tablet    Sig: Take 1 tablet by mouth every 6 (six) hours as needed for moderate pain.    Dispense:  90 tablet    Refill:  0    Do not fill until 1209 2019  . HYDROcodone-acetaminophen (NORCO/VICODIN) 5-325 MG tablet    Sig: Take 1 tablet by mouth every 6 (six) hours as needed for moderate pain.    Dispense:  90 tablet    Refill:  0    Do not fill until [id number]   Patient's Medications  New Prescriptions   No medications on file  Previous Medications   AMPHETAMINE-DEXTROAMPHETAMINE (ADDERALL XR) 20 MG 24 HR CAPSULE    Take 1 capsule (20 mg total) by mouth every morning.   AMPHETAMINE-DEXTROAMPHETAMINE (ADDERALL) 10 MG TABLET    5-23m qPM PRN on bad days   ESOMEPRAZOLE (NEXIUM) 40 MG CAPSULE    Take 40 mg by mouth.   MESALAMINE (PENTASA) 500 MG CR CAPSULE    Take 1,000 mg by mouth 3 (three) times daily as needed.    NORTRIPTYLINE (PAMELOR) 50 MG CAPSULE    Take 1 capsule (50 mg total) by mouth at bedtime.   SULFAMETHOXAZOLE-TRIMETHOPRIM (BACTRIM DS,SEPTRA DS) 800-160 MG TABLET    Take 1 tablet by mouth 2 (two)  times daily.   SUMATRIPTAN (IMITREX) 100 MG TABLET    Take 1 tablet (100 mg total) by mouth every 2 (two) hours as needed for migraine. May repeat in 2 hours if headache persists or recurs.   SUMATRIPTAN (IMITREX) 100 MG TABLET    Take 1 tablet (100 mg total) by mouth once as needed for up to 1 dose for migraine. May repeat in 2 hours if headache persists or recurs.  Modified Medications   Modified Medication Previous Medication   HYDROCODONE-ACETAMINOPHEN (NORCO/VICODIN) 5-325 MG TABLET HYDROcodone-acetaminophen (NORCO/VICODIN) 5-325 MG tablet      Take 1 tablet by mouth every 6 (six) hours as needed for moderate pain.     Take 1 tablet by mouth every 6 (six) hours as needed for moderate pain.  Discontinued Medications   No medications on file   ----------------------------------------------------------------------------------------------------------------------  Follow-up: Return in about 2 months (around 11/11/2018) for evaluation, med refill.    Molli Barrows, MD

## 2018-11-29 ENCOUNTER — Other Ambulatory Visit: Payer: Self-pay

## 2018-11-29 ENCOUNTER — Encounter (INDEPENDENT_AMBULATORY_CARE_PROVIDER_SITE_OTHER): Payer: Self-pay

## 2018-11-29 ENCOUNTER — Encounter: Payer: Self-pay | Admitting: Anesthesiology

## 2018-11-29 ENCOUNTER — Ambulatory Visit: Payer: Managed Care, Other (non HMO) | Attending: Anesthesiology | Admitting: Anesthesiology

## 2018-11-29 VITALS — BP 134/87 | HR 95 | Temp 99.1°F | Resp 20 | Ht 70.0 in | Wt 200.0 lb

## 2018-11-29 DIAGNOSIS — M4316 Spondylolisthesis, lumbar region: Secondary | ICD-10-CM | POA: Diagnosis present

## 2018-11-29 DIAGNOSIS — M542 Cervicalgia: Secondary | ICD-10-CM | POA: Diagnosis present

## 2018-11-29 DIAGNOSIS — Z79891 Long term (current) use of opiate analgesic: Secondary | ICD-10-CM | POA: Insufficient documentation

## 2018-11-29 DIAGNOSIS — G894 Chronic pain syndrome: Secondary | ICD-10-CM | POA: Insufficient documentation

## 2018-11-29 DIAGNOSIS — M47817 Spondylosis without myelopathy or radiculopathy, lumbosacral region: Secondary | ICD-10-CM | POA: Diagnosis present

## 2018-11-29 DIAGNOSIS — M5432 Sciatica, left side: Secondary | ICD-10-CM | POA: Insufficient documentation

## 2018-11-29 DIAGNOSIS — M47816 Spondylosis without myelopathy or radiculopathy, lumbar region: Secondary | ICD-10-CM | POA: Insufficient documentation

## 2018-11-29 MED ORDER — HYDROCODONE-ACETAMINOPHEN 5-325 MG PO TABS: 1.0000 | ORAL_TABLET | Freq: Four times a day (QID) | ORAL | 0 refills | Status: AC | PRN

## 2018-11-29 MED ORDER — HYDROCODONE-ACETAMINOPHEN 5-325 MG PO TABS: 1.0000 | ORAL_TABLET | Freq: Four times a day (QID) | ORAL | 0 refills | Status: DC | PRN

## 2018-11-29 NOTE — Progress Notes (Signed)
Nursing Pain Medication Assessment:  Safety precautions to be maintained throughout the outpatient stay will include: orient to surroundings, keep bed in low position, maintain call bell within reach at all times, provide assistance with transfer out of bed and ambulation.  Medication Inspection Compliance: Pill count conducted under aseptic conditions, in front of the patient. Neither the pills nor the bottle was removed from the patient's sight at any time. Once count was completed pills were immediately returned to the patient in their original bottle.  Medication: Hydrocodone/APAP Pill/Patch Count: No pills available to be counted. Pill/Patch Appearance: No markings Bottle Appearance: Standard pharmacy container. Clearly labeled. Filled Date: 1 / 8 / 2020 Last Medication intake:  Yesterday

## 2018-11-29 NOTE — Patient Instructions (Signed)
To return for med refill prior to 01-28-2019

## 2018-11-30 NOTE — Progress Notes (Signed)
Subjective:  Patient ID: Stephen Wise, male    DOB: Sep 16, 1971  Age: 48 y.o. MRN: [id number]  CC: Back Pain (lower left side)   Procedure: None  HPI Stephen Wise presents for reevaluation.  He was last seen 2 months ago and has been doing relatively well with his current medication management.  No significant changes are noted in the quality characteristic or distribution of his low back pain and leg pain.  He is tolerating his medications well and utilizing these to keep his pain under decent control.  Otherwise no changes are noted today.  Based on his narcotic assessment sheet he is continuing to derive good functional lifestyle improvement with the medicines.  Outpatient Medications Prior to Visit  Medication Sig Dispense Refill  . amphetamine-dextroamphetamine (ADDERALL XR) 20 MG 24 hr capsule Take 1 capsule (20 mg total) by mouth every morning. 30 capsule 0  . esomeprazole (NEXIUM) 40 MG capsule Take 40 mg by mouth.    . mesalamine (PENTASA) 500 MG CR capsule Take 1,000 mg by mouth 3 (three) times daily as needed.     . SUMAtriptan (IMITREX) 100 MG tablet Take 1 tablet (100 mg total) by mouth every 2 (two) hours as needed for migraine. May repeat in 2 hours if headache persists or recurs. 10 tablet 12  . SUMAtriptan (IMITREX) 100 MG tablet Take 1 tablet (100 mg total) by mouth once as needed for up to 1 dose for migraine. May repeat in 2 hours if headache persists or recurs. 10 tablet 3  . amphetamine-dextroamphetamine (ADDERALL) 10 MG tablet 5-80m qPM PRN on bad days (Patient not taking: Reported on 09/10/2018) 30 tablet 0  . nortriptyline (PAMELOR) 50 MG capsule Take 1 capsule (50 mg total) by mouth at bedtime. (Patient not taking: Reported on 09/10/2018) 30 capsule 3  . sulfamethoxazole-trimethoprim (BACTRIM DS,SEPTRA DS) 800-160 MG tablet Take 1 tablet by mouth 2 (two) times daily. (Patient not taking: Reported on 09/10/2018) 14 tablet 0  . HYDROcodone-acetaminophen (NORCO/VICODIN) 5-325  MG tablet Take 1 tablet by mouth every 6 (six) hours as needed for moderate pain. 90 tablet 0   No facility-administered medications prior to visit.     Review of Systems CNS: No confusion or sedation Cardiac: No angina or palpitations GI: No abdominal pain or constipation Constitutional: No nausea vomiting fevers or chills  Objective:  BP 134/87 (BP Location: Left Arm, Patient Position: Sitting, Cuff Size: Normal)   Pulse 95   Temp 99.1 F (37.3 C) (Oral)   Resp 20   Ht 5' 10"  (1.778 m)   Wt 200 lb (90.7 kg)   SpO2 98%   BMI 28.70 kg/m    BP Readings from Last 3 Encounters:  11/29/18 134/87  09/10/18 133/85  07/10/18 (!) 133/94     Wt Readings from Last 3 Encounters:  11/29/18 200 lb (90.7 kg)  09/10/18 200 lb (90.7 kg)  07/10/18 203 lb (92.1 kg)     Physical Exam Pt is alert and oriented PERRL EOMI HEART IS RRR no murmur or rub LCTA no wheezing or rales MUSCULOSKELETAL reveals some paraspinous muscle tenderness but no overt trigger points.  His muscle tone and bulk to the lower extremities remains at baseline.  Labs  No results found for: HGBA1C No results found for: GLUF, MICROALBUR, LDLCALC, CREATININE  -------------------------------------------------------------------------------------------------------------------- No results found for: WBC, HGB, HCT, PLT, GLUCOSE, CHOL, TRIG, HDL, LDLDIRECT, LDLCALC, ALT, AST, NA, K, CL, CREATININE, BUN, CO2, TSH, PSA, INR, GLUF, HGBA1C, MICROALBUR  --------------------------------------------------------------------------------------------------------------------- Dg  C-arm 1-60 Min-no Report  Result Date: 05/08/2018 Fluoroscopy was utilized by the requesting physician.  No radiographic interpretation.     Assessment & Plan:   Stephen Wise was seen today for back pain.  Diagnoses and all orders for this visit:  Lumbosacral spondylosis without myelopathy  Sciatica of left side  Chronic pain syndrome  Long term  current use of opiate analgesic  Spondylolisthesis of lumbar region  Facet arthritis of lumbar region  Neck pain  Other orders -     HYDROcodone-acetaminophen (NORCO/VICODIN) 5-325 MG tablet; Take 1 tablet by mouth every 6 (six) hours as needed for up to 30 days for moderate pain. -     HYDROcodone-acetaminophen (NORCO/VICODIN) 5-325 MG tablet; Take 1 tablet by mouth every 6 (six) hours as needed for up to 30 days for moderate pain or severe pain.        ----------------------------------------------------------------------------------------------------------------------  Problem List Items Addressed This Visit      Unprioritized   Chronic pain syndrome (Chronic)   Long term current use of opiate analgesic (Chronic)   Neck pain (Chronic)    Other Visit Diagnoses    Lumbosacral spondylosis without myelopathy    -  Primary   Relevant Medications   HYDROcodone-acetaminophen (NORCO/VICODIN) 5-325 MG tablet   HYDROcodone-acetaminophen (NORCO/VICODIN) 5-325 MG tablet (Start on 12/29/2018)   Sciatica of left side       Spondylolisthesis of lumbar region       Facet arthritis of lumbar region       Relevant Medications   HYDROcodone-acetaminophen (NORCO/VICODIN) 5-325 MG tablet   HYDROcodone-acetaminophen (NORCO/VICODIN) 5-325 MG tablet (Start on 12/29/2018)        ----------------------------------------------------------------------------------------------------------------------  1. Lumbosacral spondylosis without myelopathy Continue core stretching strengthening as once again reviewed today.  We will keep him on his current regimen which seems to be working well.  Scheduled for return to clinic in 2 months  2. Sciatica of left side As above  3. Chronic pain syndrome We have reviewed the [facility] practitioner database information and it is appropriate.  4. Long term current use of opiate analgesic As above  5. Spondylolisthesis of lumbar region   6. Facet  arthritis of lumbar region   7. Neck pain     ----------------------------------------------------------------------------------------------------------------------  I have changed Stephen Wise's HYDROcodone-acetaminophen. I am also having him start on HYDROcodone-acetaminophen. Additionally, I am having him maintain his mesalamine, SUMAtriptan, esomeprazole, nortriptyline, amphetamine-dextroamphetamine, amphetamine-dextroamphetamine, sulfamethoxazole-trimethoprim, and SUMAtriptan.   Meds ordered this encounter  Medications  . HYDROcodone-acetaminophen (NORCO/VICODIN) 5-325 MG tablet    Sig: Take 1 tablet by mouth every 6 (six) hours as needed for up to 30 days for moderate pain.    Dispense:  90 tablet    Refill:  0    30 day supply  . HYDROcodone-acetaminophen (NORCO/VICODIN) 5-325 MG tablet    Sig: Take 1 tablet by mouth every 6 (six) hours as needed for up to 30 days for moderate pain or severe pain.    Dispense:  90 tablet    Refill:  0    30 day supply   Patient's Medications  New Prescriptions   HYDROCODONE-ACETAMINOPHEN (NORCO/VICODIN) 5-325 MG TABLET    Take 1 tablet by mouth every 6 (six) hours as needed for up to 30 days for moderate pain or severe pain.  Previous Medications   AMPHETAMINE-DEXTROAMPHETAMINE (ADDERALL XR) 20 MG 24 HR CAPSULE    Take 1 capsule (20 mg total) by mouth every morning.   AMPHETAMINE-DEXTROAMPHETAMINE (ADDERALL) 10 MG TABLET  5-83m qPM PRN on bad days   ESOMEPRAZOLE (NEXIUM) 40 MG CAPSULE    Take 40 mg by mouth.   MESALAMINE (PENTASA) 500 MG CR CAPSULE    Take 1,000 mg by mouth 3 (three) times daily as needed.    NORTRIPTYLINE (PAMELOR) 50 MG CAPSULE    Take 1 capsule (50 mg total) by mouth at bedtime.   SULFAMETHOXAZOLE-TRIMETHOPRIM (BACTRIM DS,SEPTRA DS) 800-160 MG TABLET    Take 1 tablet by mouth 2 (two) times daily.   SUMATRIPTAN (IMITREX) 100 MG TABLET    Take 1 tablet (100 mg total) by mouth every 2 (two) hours as needed for migraine.  May repeat in 2 hours if headache persists or recurs.   SUMATRIPTAN (IMITREX) 100 MG TABLET    Take 1 tablet (100 mg total) by mouth once as needed for up to 1 dose for migraine. May repeat in 2 hours if headache persists or recurs.  Modified Medications   Modified Medication Previous Medication   HYDROCODONE-ACETAMINOPHEN (NORCO/VICODIN) 5-325 MG TABLET HYDROcodone-acetaminophen (NORCO/VICODIN) 5-325 MG tablet      Take 1 tablet by mouth every 6 (six) hours as needed for up to 30 days for moderate pain.    Take 1 tablet by mouth every 6 (six) hours as needed for moderate pain.  Discontinued Medications   No medications on file   ----------------------------------------------------------------------------------------------------------------------  Follow-up: Return in about 2 months (around 01/28/2019) for evaluation, med refill.    JMolli Barrows MD

## 2018-12-03 NOTE — Progress Notes (Deleted)
NEUROLOGY FOLLOW UP OFFICE NOTE  Stephen Wise [id number]  HISTORY OF PRESENT ILLNESS: Stephen Wise is a 48 year old male with adult ADHD, Crohn's disease, chronic pain syndrome/low back pain and migraine who follows up for migraine.  UPDATE: Intensity:  *** Duration:  *** Frequency:  *** Frequency of abortive medication: *** Rescue protocol:*** Current NSAIDS:  Advil, Motrin Current analgesics:  no Current triptans:  sumatriptan 165m Current anti-emetic:  no Current muscle relaxants:  no Current anti-anxiolytic:  no Current sleep aide:  no Current Antihypertensive medications:  no Current Antidepressant medications:  nortriptyline 273mCurrent Anticonvulsant medications:  no Current Vitamins/Herbal/Supplements:  no Current Antihistamines/Decongestants:  no Other therapy:  no Other medication:  Adderall XR  Caffeine:  3 diet [employer] daily Alcohol:  no Smoker:  no Diet:  hydrates Exercise:  yes Depression:  no; Anxiety:  no Other pain:  Chronic pain Sleep hygiene:  good  HISTORY:  Onset:  2013 Location:  Left temple but spreads to whole head Quality:  Stabbing, pounding Initial Intensity:  severe.  He denies new headache, thunderclap headache or severe headache that wakes him from sleep. Aura:  Sometimes preceded with tunnel vision and flickering lights Prodrome:  no Postdrome:  no Associated symptoms: Photophobia.  Rarely nausea.  He denies associated phonophobia or unilateral numbness or weakness. Initial Duration:  Up to 3 hours or all day (brief with sumatriptan but often tries other medications first because he only gets 9 pills a month) Initial Frequency:  1 to 2 days a week Initial Frequency of abortive medication: every other day Triggers: Emotional stress Relieving factors: Ice pack, dark room Activity:  aggrvates He also has dull bifrontal non-throbbing headaches about once a week, treated with Advil.  So he has a total of about 16 headache days a  month.    MRI of brain without contrast from 06/30/17 was personally reviewed and was negative.   Past NSAIDS:  no Past analgesics:  Excedrin Migraine (effective but aggravated Crohn's), Fioricet with Codeine Past abortive triptans:  no Past muscle relaxants:  no Past anti-emetic:  no Past antihypertensive medications:  propranolol ER Past antidepressant medications:  Cymbalta 3064mast anticonvulsant medications:  topiramate Past vitamins/Herbal/Supplements:  no Past antihistamines/decongestants:  no Other past therapies:  no   Family history of headache:  Sister had migraines as a child  PAST MEDICAL HISTORY: Past Medical History:  Diagnosis Date  . Adult ADHD   . Chronic headaches   . Chronic pain   . Chronic prostatitis   . Condyloma acuminatum of penis   . [place]  with [place]itis  . ED (erectile dysfunction)   . Gastric ulcer   . Inflammatory bowel disease   . Microcytic anemia   . Peyronie's disease     MEDICATIONS: Current Outpatient Medications on File Prior to Visit  Medication Sig Dispense Refill  . amphetamine-dextroamphetamine (ADDERALL XR) 20 MG 24 hr capsule Take 1 capsule (20 mg total) by mouth every morning. 30 capsule 0  . amphetamine-dextroamphetamine (ADDERALL) 10 MG tablet 5-39m49mM PRN on bad days (Patient not taking: Reported on 09/10/2018) 30 tablet 0  . esomeprazole (NEXIUM) 40 MG capsule Take 40 mg by mouth.    . HYMarland KitchenROcodone-acetaminophen (NORCO/VICODIN) 5-325 MG tablet Take 1 tablet by mouth every 6 (six) hours as needed for up to 30 days for moderate pain. 90 tablet 0  . [START ON 12/29/2018] HYDROcodone-acetaminophen (NORCO/VICODIN) 5-325 MG tablet Take 1 tablet by mouth every 6 (  six) hours as needed for up to 30 days for moderate pain or severe pain. 90 tablet 0  . mesalamine (PENTASA) 500 MG CR capsule Take 1,000 mg by mouth 3 (three) times daily as needed.     . nortriptyline (PAMELOR) 50 MG capsule Take 1 capsule (50 mg  total) by mouth at bedtime. (Patient not taking: Reported on 09/10/2018) 30 capsule 3  . sulfamethoxazole-trimethoprim (BACTRIM DS,SEPTRA DS) 800-160 MG tablet Take 1 tablet by mouth 2 (two) times daily. (Patient not taking: Reported on 09/10/2018) 14 tablet 0  . SUMAtriptan (IMITREX) 100 MG tablet Take 1 tablet (100 mg total) by mouth every 2 (two) hours as needed for migraine. May repeat in 2 hours if headache persists or recurs. 10 tablet 12  . SUMAtriptan (IMITREX) 100 MG tablet Take 1 tablet (100 mg total) by mouth once as needed for up to 1 dose for migraine. May repeat in 2 hours if headache persists or recurs. 10 tablet 3   No current facility-administered medications on file prior to visit.     ALLERGIES: No Known Allergies  FAMILY HISTORY: Family History  Problem Relation Age of Onset  . Irritable bowel syndrome Mother   . Cirrhosis Father   . Alzheimer's disease Maternal Grandmother    ***.  SOCIAL HISTORY: Social History   Socioeconomic History  . Marital status: Married    Spouse name: Dottie  . Number of children: 4  . Years of education: Not on file  . Highest education level: Some college, no degree  Occupational History  . Occupation: Office manager  . Financial resource strain: Not on file  . Food insecurity:    Worry: Not on file    Inability: Not on file  . Transportation needs:    Medical: Not on file    Non-medical: Not on file  Tobacco Use  . Smoking status: Never Smoker  . Smokeless tobacco: Never Used  Substance and Sexual Activity  . Alcohol use: No  . Drug use: No  . Sexual activity: Yes  Lifestyle  . Physical activity:    Days per week: Not on file    Minutes per session: Not on file  . Stress: Not on file  Relationships  . Social connections:    Talks on phone: Not on file    Gets together: Not on file    Attends religious service: Not on file    Active member of club or organization: Not on file    Attends meetings of clubs  or organizations: Not on file    Relationship status: Not on file  . Intimate partner violence:    Fear of current or ex partner: Not on file    Emotionally abused: Not on file    Physically abused: Not on file    Forced sexual activity: Not on file  Other Topics Concern  . Not on file  Social History Narrative   Pt is right handed. He is married, lives in a one story house with a basement. He drinks 3 sodas a day. He is very active at work.    REVIEW OF SYSTEMS: Constitutional: No fevers, chills, or sweats, no generalized fatigue, change in appetite Eyes: No visual changes, double vision, eye pain Ear, nose and throat: No hearing loss, ear pain, nasal congestion, sore throat Cardiovascular: No chest pain, palpitations Respiratory:  No shortness of breath at rest or with exertion, wheezes GastrointestinaI: No nausea, vomiting, diarrhea, abdominal pain, fecal incontinence Genitourinary:  No  dysuria, urinary retention or frequency Musculoskeletal:  No neck pain, back pain Integumentary: No rash, pruritus, skin lesions Neurological: as above Psychiatric: No depression, insomnia, anxiety Endocrine: No palpitations, fatigue, diaphoresis, mood swings, change in appetite, change in weight, increased thirst Hematologic/Lymphatic:  No purpura, petechiae. Allergic/Immunologic: no itchy/runny eyes, nasal congestion, recent allergic reactions, rashes  PHYSICAL EXAM: *** General: No acute distress.  Patient appears ***-groomed.  *** body habitus. Head:  Normocephalic/atraumatic Eyes:  Fundi examined but not visualized Neck: supple, no paraspinal tenderness, full range of motion Heart:  Regular rate and rhythm Lungs:  Clear to auscultation bilaterally Back: No paraspinal tenderness Neurological Exam: alert and oriented to person, place, and time. Attention span and concentration intact, recent and remote memory intact, fund of knowledge intact.  Speech fluent and not dysarthric, language  intact.  CN II-XII intact. Bulk and tone normal, muscle strength 5/5 throughout.  Sensation to light touch, temperature and vibration intact.  Deep tendon reflexes 2+ throughout, toes downgoing.  Finger to nose and heel to shin testing intact.  Gait normal, Romberg negative.  IMPRESSION: ***  PLAN: ***  Metta Clines, DO  CC: ***

## 2018-12-05 ENCOUNTER — Ambulatory Visit: Payer: Managed Care, Other (non HMO) | Admitting: Neurology

## 2018-12-23 ENCOUNTER — Other Ambulatory Visit: Payer: Self-pay | Admitting: Neurology

## 2018-12-23 DIAGNOSIS — G43109 Migraine with aura, not intractable, without status migrainosus: Secondary | ICD-10-CM

## 2018-12-24 ENCOUNTER — Telehealth: Payer: Self-pay

## 2018-12-24 NOTE — Telephone Encounter (Signed)
Called Pt to advise we are not able to see Pt's in the office and offer either a video or telephone visit. I had to LMOVM.

## 2018-12-26 ENCOUNTER — Ambulatory Visit: Payer: Managed Care, Other (non HMO) | Admitting: Neurology

## 2019-01-13 NOTE — Progress Notes (Signed)
Virtual Visit via Video Note The purpose of this virtual visit is to provide medical care while limiting exposure to the novel coronavirus.    Consent was obtained for video visit:  yes Answered questions that patient had about telehealth interaction:  yes I discussed the limitations, risks, security and privacy concerns of performing an evaluation and management service by telemedicine. I also discussed with the patient that there may be a patient responsible charge related to this service. The patient expressed understanding and agreed to proceed.  Pt location: Home Physician Location: office Name of referring provider:  No ref. provider found I connected with Stephen Wise at patients initiation/request on 01/14/2019 at 11:30 AM EDT by video enabled telemedicine application and verified that I am speaking with the correct person using two identifiers. Pt MRN:  [id number] Pt DOB:  1970-11-20 Video Participants:  Stephen Wise   History of Present Illness:  Stephen Wise is a 48 year old man with adult ADHD, Crohn's disease, chronic pain syndrome/low back pain and migraine who follows up for migraines.  UPDATE: No significant change in headaches.  Nortriptyline causes photosensitivity which also may trigger a headache. Intensity:  severe Duration:  1 hour Frequency:  3 days a week Frequency of abortive medication: 2-3 days a week Rescue protocol:  sumatriptan with rest and ice pack Current NSAIDS: none Current analgesics: none Current triptans:  sumatriptan 19m  Current anti-emetic:  no Current muscle relaxants:  no Current anti-anxiolytic:  no Current sleep aide:  no Current Antihypertensive medications:  no Current Antidepressant medications:  nortriptyline 515mCurrent Anticonvulsant medications:  no Current Vitamins/Herbal/Supplements:  no Current Antihistamines/Decongestants:  no Other therapy:  no Other medication:  Adderall XR  Caffeine:  3 diet [employer] daily Alcohol:   no Smoker:  no Diet:  hydrates Exercise:  yes Depression:  no; Anxiety:  no Other pain:  Chronic pain Sleep hygiene:  good  HISTORY: Onset: 2013 Location:  Left temple but spreads to whole head Quality:  Stabbing, pounding Initial Intensity:  severe.  He denies new headache, thunderclap headache or severe headache that wakes him from sleep. Aura:  Sometimes preceded with tunnel vision and flickering lights Prodrome:  no Postdrome:  no Associated symptoms:  Photophobia.  Rarely nausea.  He denies associated phonophobia or unilateral numbness or weakness. Initial duration:  Up to 3 hours or all day (brief with sumatriptan but often tries other medications first because he only gets 9 pills a month) Initial Frequency:  1 to 2 days a week Initial Frequency of abortive medication: every other day Triggers:  Emotional stress Relieving factors:  Ice pack, dark room Activity:  aggrvates He also has dull bifrontal non-throbbing headaches about once a week, treated with Advil.  So he has a total of about 16 headache days a month.  MRI of brain without contrast from 06/30/17 was personally reviewed and was negative.  Past NSAIDS:  Advil, Motrin Past analgesics:  Excedrin Migraine (effective but aggravated Crohn's), Fioricet with codeine Past abortive triptans:  no Past muscle relaxants:  no Past anti-emetic:  no Past antihypertensive medications:  propranolol ER Past antidepressant medications:  Cymbalta 3018mast anticonvulsant medications:  topiramate Past vitamins/Herbal/Supplements:  no Past antihistamines/decongestants:  no Other past therapies:  no  Family history of headache:  Sister had migraines as a child  Past Medical History: Past Medical History:  Diagnosis Date  . Adult ADHD   . Chronic headaches   . Chronic pain   . Chronic prostatitis   .  Condyloma acuminatum of penis   . Crohn disease ([place])    with terminal ileitis  . ED (erectile dysfunction)   . Gastric  ulcer   . Inflammatory bowel disease   . Microcytic anemia   . Peyronie's disease     Medications: Outpatient Encounter Medications as of 01/14/2019  Medication Sig  . amphetamine-dextroamphetamine (ADDERALL XR) 20 MG 24 hr capsule Take 1 capsule (20 mg total) by mouth every morning.  Marland Kitchen amphetamine-dextroamphetamine (ADDERALL) 10 MG tablet 5-2m qPM PRN on bad days (Patient not taking: Reported on 09/10/2018)  . esomeprazole (NEXIUM) 40 MG capsule Take 40 mg by mouth.  .Marland KitchenHYDROcodone-acetaminophen (NORCO/VICODIN) 5-325 MG tablet Take 1 tablet by mouth every 6 (six) hours as needed for up to 30 days for moderate pain or severe pain.  . mesalamine (PENTASA) 500 MG CR capsule Take 1,000 mg by mouth 3 (three) times daily as needed.   . nortriptyline (PAMELOR) 50 MG capsule Take 1 capsule (50 mg total) by mouth at bedtime. (Patient not taking: Reported on 09/10/2018)  . sulfamethoxazole-trimethoprim (BACTRIM DS,SEPTRA DS) 800-160 MG tablet Take 1 tablet by mouth 2 (two) times daily. (Patient not taking: Reported on 09/10/2018)  . SUMAtriptan (IMITREX) 100 MG tablet Take 1 tablet (100 mg total) by mouth every 2 (two) hours as needed for migraine. May repeat in 2 hours if headache persists or recurs.  . SUMAtriptan (IMITREX) 100 MG tablet TAKE 1 TABLET (100 MG TOTAL) BY MOUTH ONCE AS NEEDED FOR UP TO 1 DOSE FOR MIGRAINE. MAY REPEAT IN 2 HOURS IF HEADACHE PERSISTS OR RECURS.   No facility-administered encounter medications on file as of 01/14/2019.     Allergies: No Known Allergies  Family History: Family History  Problem Relation Age of Onset  . Irritable bowel syndrome Mother   . Cirrhosis Father   . Alzheimer's disease Maternal Grandmother     Social History: Social History   Socioeconomic History  . Marital status: Married    Spouse name: Dottie  . Number of children: 4  . Years of education: Not on file  . Highest education level: Some college, no degree  Occupational History  .  Occupation: WOffice manager . Financial resource strain: Not on file  . Food insecurity:    Worry: Not on file    Inability: Not on file  . Transportation needs:    Medical: Not on file    Non-medical: Not on file  Tobacco Use  . Smoking status: Never Smoker  . Smokeless tobacco: Never Used  Substance and Sexual Activity  . Alcohol use: No  . Drug use: No  . Sexual activity: Yes  Lifestyle  . Physical activity:    Days per week: Not on file    Minutes per session: Not on file  . Stress: Not on file  Relationships  . Social connections:    Talks on phone: Not on file    Gets together: Not on file    Attends religious service: Not on file    Active member of club or organization: Not on file    Attends meetings of clubs or organizations: Not on file    Relationship status: Not on file  . Intimate partner violence:    Fear of current or ex partner: Not on file    Emotionally abused: Not on file    Physically abused: Not on file    Forced sexual activity: Not on file  Other Topics Concern  . Not on  file  Social History Narrative   Pt is right handed. He is married, lives in a one story house with a basement. He drinks 3 sodas a day. He is very active at work.    Review Of Systems: Review of Systems  Constitutional: Negative for chills, diaphoresis and fever.  HENT: Negative for congestion, ear discharge, ear pain, hearing loss, nosebleeds, sinus pain, sore throat and tinnitus.   Eyes: Positive for photophobia. Negative for blurred vision, double vision, pain, discharge and redness.  Respiratory: Negative for cough, sputum production and shortness of breath.   Cardiovascular: Negative for chest pain, palpitations and orthopnea.  Gastrointestinal: Negative for abdominal pain, constipation, diarrhea, nausea and vomiting.  Genitourinary: Negative for dysuria.  Musculoskeletal: Negative for myalgias.  Skin: Negative for itching and rash.  Neurological: Negative for  tremors, sensory change, speech change and focal weakness.  Endo/Heme/Allergies: Negative for environmental allergies. Does not bruise/bleed easily.  Psychiatric/Behavioral: Negative for depression. The patient is not nervous/anxious.        Observations/Objective:   There were no vitals taken for this visit. alert and oriented to person, place, and time. Attention span and concentration intact, recent and remote memory intact, fund of knowledge intact.  Speech fluent and not dysarthric, language intact.  Eyes orthophoric and move in all directions.  Face symmetric.  Assessment and Plan:   Migraine with or without aura, without status migrainosus, not intractable  1.  discontinue nortriptyline due to photosensitivity.  For preventative management, start topiramate 55m, increasing to 545mat bedtime in one week.  We can increase dose to 10081mt bedtime in 5 weeks if needed. 2.  For abortive therapy, sumatriptan 100m96m  Limit use of pain relievers to no more than 2 days out of week to prevent risk of rebound or medication-overuse headache. 4.  Keep headache diary 5.  Exercise, hydration, caffeine cessation, sleep hygiene, monitor for and avoid triggers 6.  Consider:  magnesium citrate 400mg46mly, riboflavin 400mg 31my, and coenzyme Q10 100mg t44m times daily 7.  Follow up in 4 months   Follow Up Instructions:    -I discussed the assessment and treatment plan with the patient. The patient was provided an opportunity to ask questions and all were answered. The patient agreed with the plan and demonstrated an understanding of the instructions.   The patient was advised to call back or seek an in-person evaluation if the symptoms worsen or if the condition fails to improve as anticipated.  Lamiyah Schlotter RoDudley Major

## 2019-01-14 ENCOUNTER — Encounter: Payer: Self-pay | Admitting: Neurology

## 2019-01-14 ENCOUNTER — Telehealth (INDEPENDENT_AMBULATORY_CARE_PROVIDER_SITE_OTHER): Payer: Managed Care, Other (non HMO) | Admitting: Neurology

## 2019-01-14 ENCOUNTER — Other Ambulatory Visit: Payer: Self-pay

## 2019-01-14 DIAGNOSIS — G43709 Chronic migraine without aura, not intractable, without status migrainosus: Secondary | ICD-10-CM

## 2019-01-14 DIAGNOSIS — G43109 Migraine with aura, not intractable, without status migrainosus: Secondary | ICD-10-CM

## 2019-01-14 MED ORDER — SUMATRIPTAN SUCCINATE 100 MG PO TABS: ORAL_TABLET | ORAL | 5 refills | Status: DC

## 2019-01-14 MED ORDER — TOPIRAMATE 25 MG PO TABS: ORAL_TABLET | ORAL | 0 refills | Status: DC

## 2019-01-14 NOTE — Patient Instructions (Addendum)
1.  Stop nortriptyline.  Instead, start topiramate 11m at bedtime.  In 7 days, increase dose to 2 tablets at bedtime.  Contact me with update at end of prescription for refill or increase in dose. 2.  Continue sumatriptan as needed for when you get a migraine. 3.  Limit use of pain relievers to no more than 2 days out of week to prevent risk of rebound or medication-overuse headache. 4.  Keep headache diary 5.  Follow up in 4 months.

## 2019-01-28 ENCOUNTER — Encounter: Payer: Self-pay | Admitting: Anesthesiology

## 2019-01-28 ENCOUNTER — Other Ambulatory Visit: Payer: Self-pay | Admitting: Anesthesiology

## 2019-01-28 ENCOUNTER — Other Ambulatory Visit: Payer: Self-pay

## 2019-01-28 ENCOUNTER — Ambulatory Visit: Payer: Managed Care, Other (non HMO) | Attending: Anesthesiology | Admitting: Anesthesiology

## 2019-01-28 DIAGNOSIS — M47817 Spondylosis without myelopathy or radiculopathy, lumbosacral region: Secondary | ICD-10-CM

## 2019-01-28 DIAGNOSIS — M545 Low back pain, unspecified: Secondary | ICD-10-CM

## 2019-01-28 DIAGNOSIS — Z79891 Long term (current) use of opiate analgesic: Secondary | ICD-10-CM

## 2019-01-28 DIAGNOSIS — M5432 Sciatica, left side: Secondary | ICD-10-CM

## 2019-01-28 DIAGNOSIS — M47816 Spondylosis without myelopathy or radiculopathy, lumbar region: Secondary | ICD-10-CM

## 2019-01-28 DIAGNOSIS — M4316 Spondylolisthesis, lumbar region: Secondary | ICD-10-CM

## 2019-01-28 DIAGNOSIS — G894 Chronic pain syndrome: Secondary | ICD-10-CM

## 2019-01-28 DIAGNOSIS — M542 Cervicalgia: Secondary | ICD-10-CM

## 2019-01-28 DIAGNOSIS — G8929 Other chronic pain: Secondary | ICD-10-CM

## 2019-01-28 MED ORDER — HYDROCODONE-ACETAMINOPHEN 5-325 MG PO TABS: 1.0000 | ORAL_TABLET | Freq: Four times a day (QID) | ORAL | 0 refills | Status: AC | PRN

## 2019-01-28 NOTE — Progress Notes (Signed)
Subjective:  Patient ID: Stephen Wise, male    DOB: [birth year]  Age: 48 y.o. MRN: [id number]  CC: No chief complaint on file.   Procedure: None  HPI Paras Kreider presents for reevaluation.  He was communicated with video conference after dental.  Per our discussion today he is doing well.  The quality characteristic and distribution of his pain have remained stable.  No other changes are noted.  He is taking his medications as prescribed and these continue to give him good relief with no side effects.  He continues to try to do his stretching strengthening exercises as tolerated.  Otherwise he is in his usual state of health.  Outpatient Medications Prior to Visit  Medication Sig Dispense Refill  . amphetamine-dextroamphetamine (ADDERALL XR) 20 MG 24 hr capsule Take 1 capsule (20 mg total) by mouth every morning. 30 capsule 0  . esomeprazole (NEXIUM) 40 MG capsule Take 40 mg by mouth.    . mesalamine (PENTASA) 500 MG CR capsule Take 1,000 mg by mouth 3 (three) times daily as needed.     . SUMAtriptan (IMITREX) 100 MG tablet Take 1 tab earliest onset of migraine.  May repeat in 2 hours if headache persists or recurs. Maximum 2 tabs/24 hours 10 tablet 5  . topiramate (TOPAMAX) 25 MG tablet Take 1 tablet at bedtime for 7 days, then increase to 2 tablets at bedtime 60 tablet 0  . HYDROcodone-acetaminophen (NORCO/VICODIN) 5-325 MG tablet Take 1 tablet by mouth every 6 (six) hours as needed for up to 30 days for moderate pain or severe pain. 90 tablet 0   No facility-administered medications prior to visit.     Review of Systems CNS: No confusion or sedation Cardiac: No angina or palpitations GI: No abdominal pain or constipation Constitutional: No nausea vomiting fevers or chills  Objective:  There were no vitals taken for this visit.   BP Readings from Last 3 Encounters:  11/29/18 134/87  09/10/18 133/85  07/10/18 (!) 133/94     Wt Readings from Last 3 Encounters:  11/29/18 200 lb  (90.7 kg)  09/10/18 200 lb (90.7 kg)  07/10/18 203 lb (92.1 kg)     Physical Exam Deferred secondary to the COVID issue  Labs  No results found for: HGBA1C No results found for: GLUF, MICROALBUR, LDLCALC, CREATININE  -------------------------------------------------------------------------------------------------------------------- No results found for: WBC, HGB, HCT, PLT, GLUCOSE, CHOL, TRIG, HDL, LDLDIRECT, LDLCALC, ALT, AST, NA, K, CL, CREATININE, BUN, CO2, TSH, PSA, INR, GLUF, HGBA1C, MICROALBUR  --------------------------------------------------------------------------------------------------------------------- Dg C-arm 1-60 Min-no Report  Result Date: 05/08/2018 Fluoroscopy was utilized by the requesting physician.  No radiographic interpretation.     Assessment & Plan:   Diagnoses and all orders for this visit:  Lumbosacral spondylosis without myelopathy  Sciatica of left side  Chronic pain syndrome  Long term current use of opiate analgesic  Spondylolisthesis of lumbar region  Facet arthritis of lumbar region  Neck pain  Chronic left-sided low back pain without sciatica  Other orders -     HYDROcodone-acetaminophen (NORCO/VICODIN) 5-325 MG tablet; Take 1 tablet by mouth every 6 (six) hours as needed for up to 30 days for moderate pain or severe pain. -     HYDROcodone-acetaminophen (NORCO/VICODIN) 5-325 MG tablet; Take 1 tablet by mouth every 6 (six) hours as needed for up to 30 days for moderate pain or severe pain.        ----------------------------------------------------------------------------------------------------------------------  Problem List Items Addressed This Visit      Unprioritized  Chronic pain syndrome (Chronic)   Relevant Medications   HYDROcodone-acetaminophen (NORCO/VICODIN) 5-325 MG tablet   HYDROcodone-acetaminophen (NORCO/VICODIN) 5-325 MG tablet (Start on 02/27/2019)   Long term current use of opiate analgesic (Chronic)    Neck pain (Chronic)    Other Visit Diagnoses    Lumbosacral spondylosis without myelopathy    -  Primary   Relevant Medications   HYDROcodone-acetaminophen (NORCO/VICODIN) 5-325 MG tablet   HYDROcodone-acetaminophen (NORCO/VICODIN) 5-325 MG tablet (Start on 02/27/2019)   Sciatica of left side       Spondylolisthesis of lumbar region       Facet arthritis of lumbar region       Relevant Medications   HYDROcodone-acetaminophen (NORCO/VICODIN) 5-325 MG tablet   HYDROcodone-acetaminophen (NORCO/VICODIN) 5-325 MG tablet (Start on 02/27/2019)   Chronic left-sided low back pain without sciatica       Relevant Medications   HYDROcodone-acetaminophen (NORCO/VICODIN) 5-325 MG tablet   HYDROcodone-acetaminophen (NORCO/VICODIN) 5-325 MG tablet (Start on 02/27/2019)        ----------------------------------------------------------------------------------------------------------------------  1. Lumbosacral spondylosis without myelopathy We will have him continue his stretching strengthening exercises and we are going to schedule him for 52-monthreturn to clinic once the viral crisis has abated  2. Sciatica of left side As above  3. Chronic pain syndrome As above  4. Long term current use of opiate analgesic As above.  [place]  5. Spondylolisthesis of lumbar region As above  6. Facet arthritis of lumbar region   7. Neck pain   8. Chronic left-sided low back pain without sciatica     ----------------------------------------------------------------------------------------------------------------------  I am having DTillman Sersstart on HYDROcodone-acetaminophen. I am also having him maintain his mesalamine, esomeprazole, amphetamine-dextroamphetamine, SUMAtriptan, topiramate, and HYDROcodone-acetaminophen.   Meds ordered this encounter  Medications  . HYDROcodone-acetaminophen (NORCO/VICODIN)  5-325 MG tablet    Sig: Take 1 tablet by mouth every 6 (six) hours as needed for up to 30 days for moderate pain or severe pain.    Dispense:  90 tablet    Refill:  0    30 day supply  . HYDROcodone-acetaminophen (NORCO/VICODIN) 5-325 MG tablet    Sig: Take 1 tablet by mouth every 6 (six) hours as needed for up to 30 days for moderate pain or severe pain.    Dispense:  90 tablet    Refill:  0   Patient's Medications  New Prescriptions   HYDROCODONE-ACETAMINOPHEN (NORCO/VICODIN) 5-325 MG TABLET    Take 1 tablet by mouth every 6 (six) hours as needed for up to 30 days for moderate pain or severe pain.  Previous Medications   AMPHETAMINE-DEXTROAMPHETAMINE (ADDERALL XR) 20 MG 24 HR CAPSULE    Take 1 capsule (20 mg total) by mouth every morning.   ESOMEPRAZOLE (NEXIUM) 40 MG CAPSULE    Take 40 mg by mouth.   MESALAMINE (PENTASA) 500 MG CR CAPSULE    Take 1,000 mg by mouth 3 (three) times daily as needed.    SUMATRIPTAN (IMITREX) 100 MG TABLET    Take 1 tab earliest onset of migraine.  May repeat in 2 hours if headache persists or recurs. Maximum 2 tabs/24 hours   TOPIRAMATE (TOPAMAX) 25 MG TABLET    Take 1 tablet at bedtime for 7 days, then increase to 2 tablets at bedtime  Modified Medications   Modified Medication Previous Medication   HYDROCODONE-ACETAMINOPHEN (NORCO/VICODIN) 5-325 MG TABLET HYDROcodone-acetaminophen (NORCO/VICODIN) 5-325 MG tablet  Take 1 tablet by mouth every 6 (six) hours as needed for up to 30 days for moderate pain or severe pain.    Take 1 tablet by mouth every 6 (six) hours as needed for up to 30 days for moderate pain or severe pain.  Discontinued Medications   No medications on file   ----------------------------------------------------------------------------------------------------------------------  Follow-up: Return in about 2 months (around 03/30/2019) for evaluation, med refill.    Molli Barrows, MD

## 2019-02-15 ENCOUNTER — Other Ambulatory Visit: Payer: Self-pay | Admitting: Neurology

## 2019-03-21 IMAGING — MR MR LUMBAR SPINE W/O CM
4 of 5 series · 26 of 48 positions shown · non-contrast
Comparison: Radiographs dated 03/11/2017

CLINICAL DATA: Low back pain and bilateral leg pain.

EXAM:
MRI LUMBAR SPINE WITHOUT CONTRAST
TECHNIQUE: Multiplanar, multisequence MR imaging of the lumbar spine was
performed. No intravenous contrast was administered.

[Series 2: T2 · sagittal · 4.0mm · 0.81mm/px · 6 of 15 slices shown (1 of 2)]
[im 1/15]
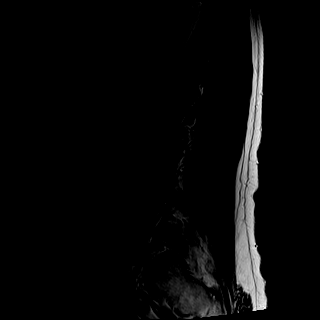
[im 3/15]
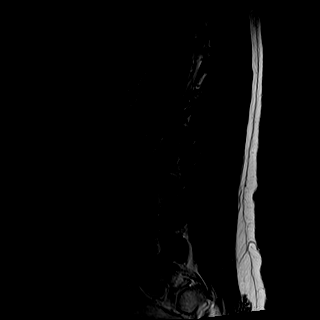
[im 6/15]
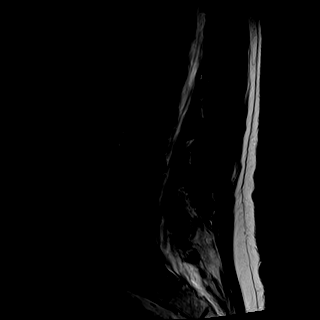
[im 9/15]
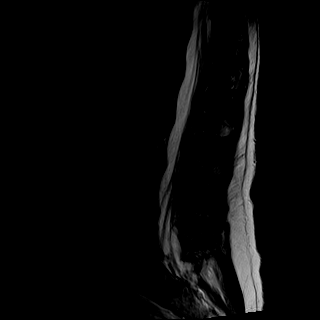
[im 12/15]
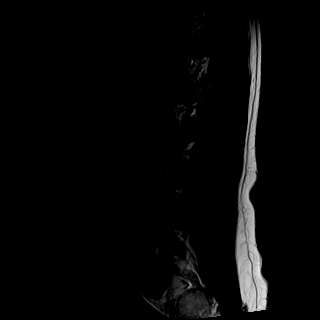
[im 15/15]
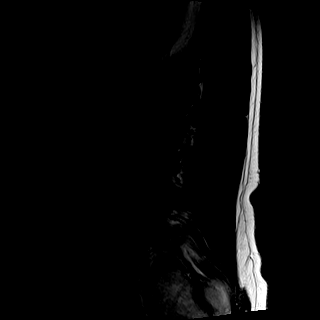

[Series 3: T1 · sagittal · 4.0mm · 0.41mm/px · 6 of 15 slices shown (1 of 2)]
[im 1/15]
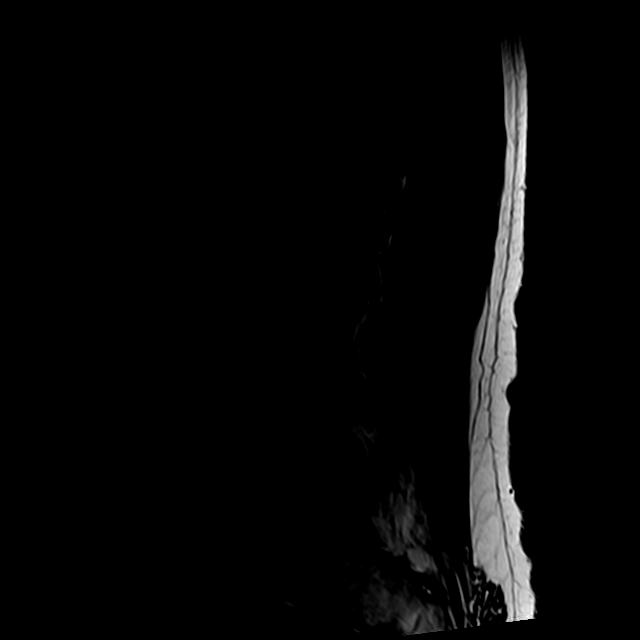
[im 3/15]
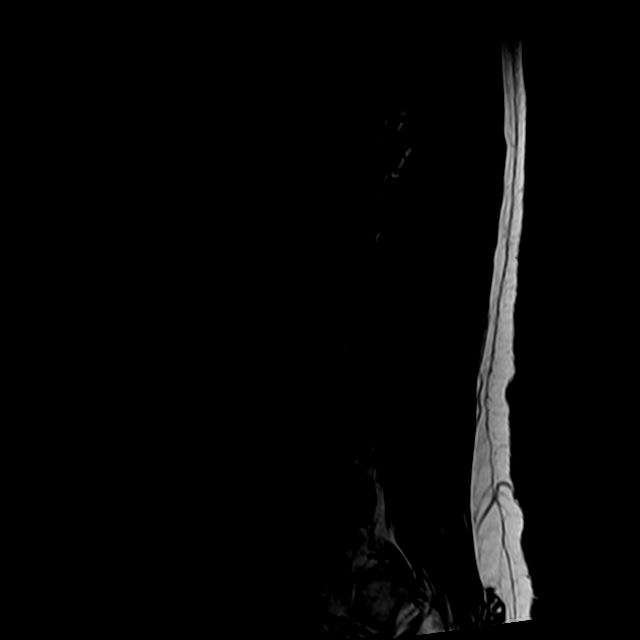
[im 6/15]
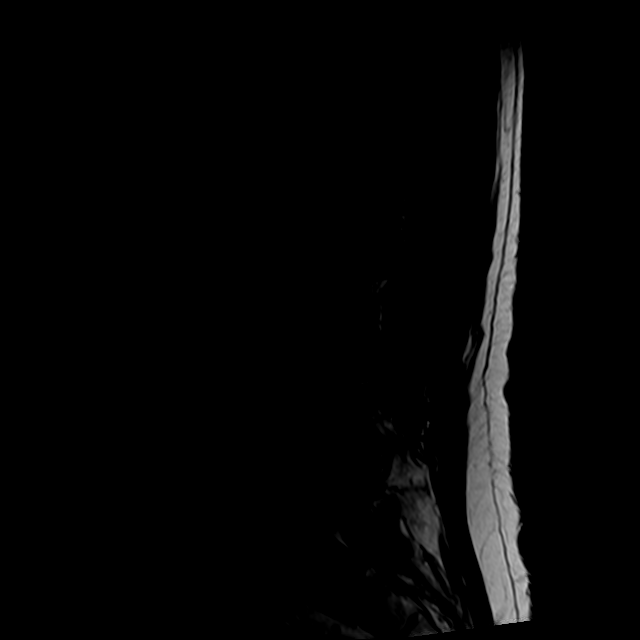
[im 9/15]
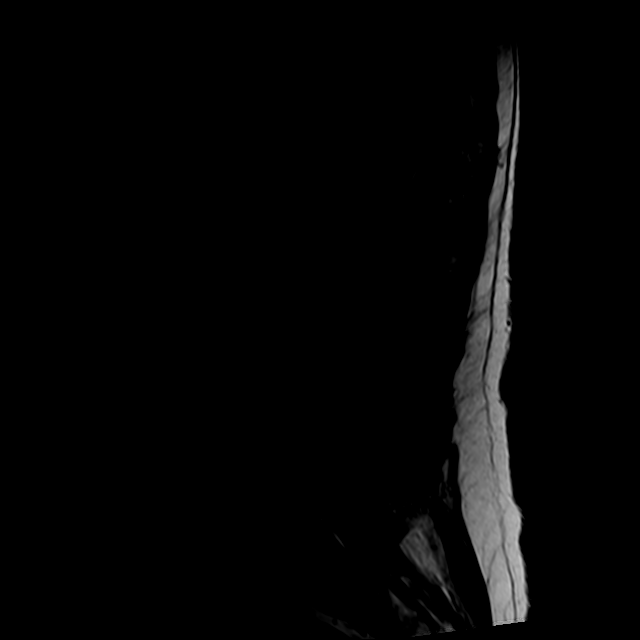
[im 12/15]
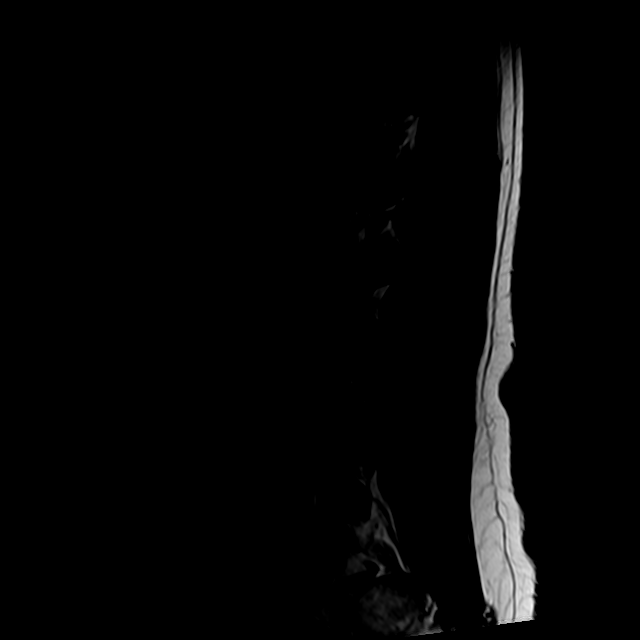
[im 15/15]
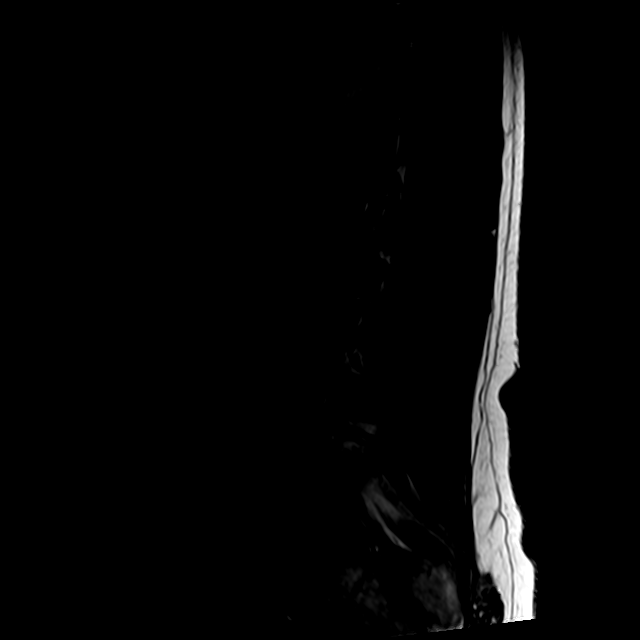

[Series 5: T2 · axial · 4.0mm · 0.78mm/px · z∈[-133,+84]mm · 9 of 40 slices shown (2 of 2)]
[im 1/40]
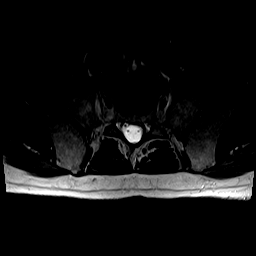
[im 6/40]
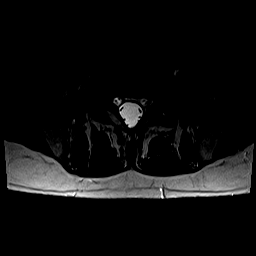
[im 12/40]
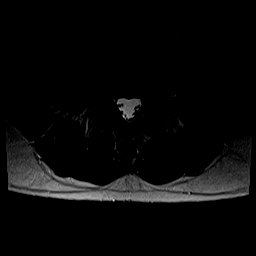
[im 17/40]
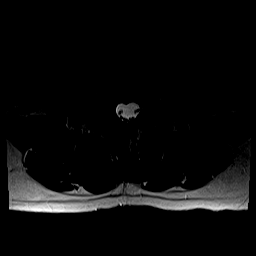
[im 20/40]
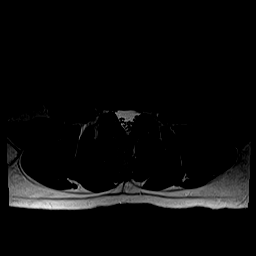
[im 23/40]
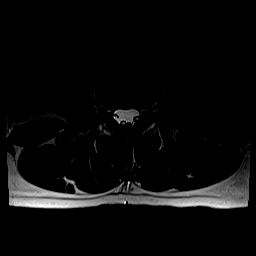
[im 28/40]
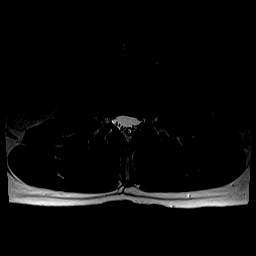
[im 34/40]
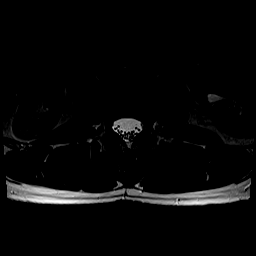
[im 40/40]
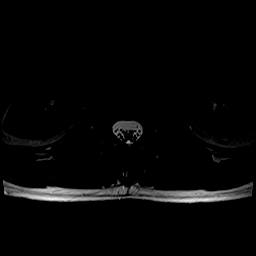

[Series 6: T1 · axial · 4.0mm · 0.39mm/px · z∈[-133,+54]mm · 5 of 40 slices shown (2 of 2)]
[im 1/40]
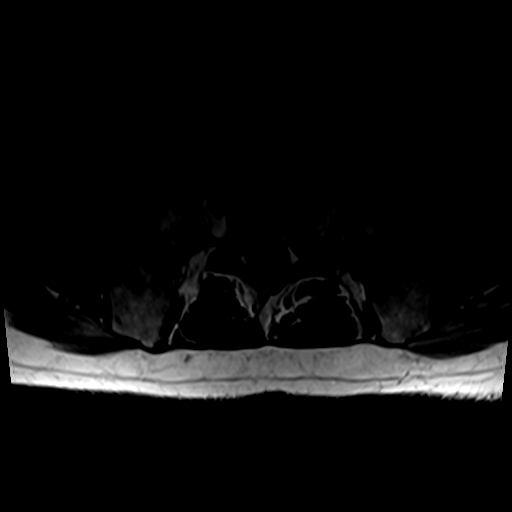
[im 6/40]
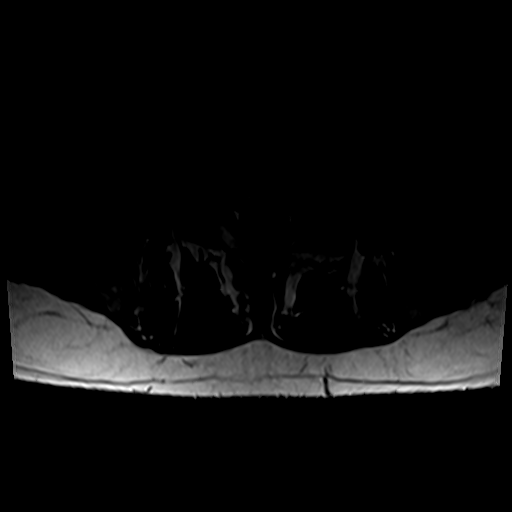
[im 12/40]
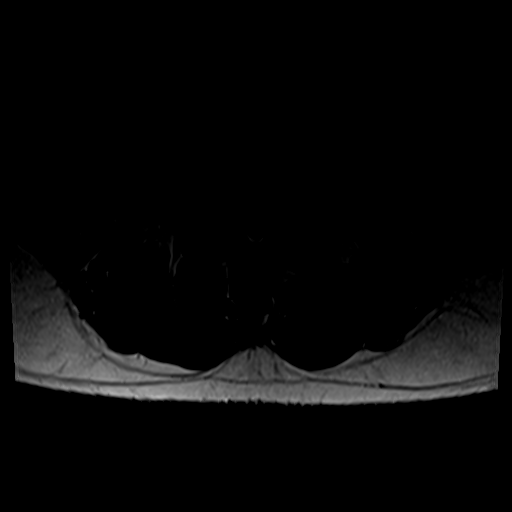
[im 20/40]
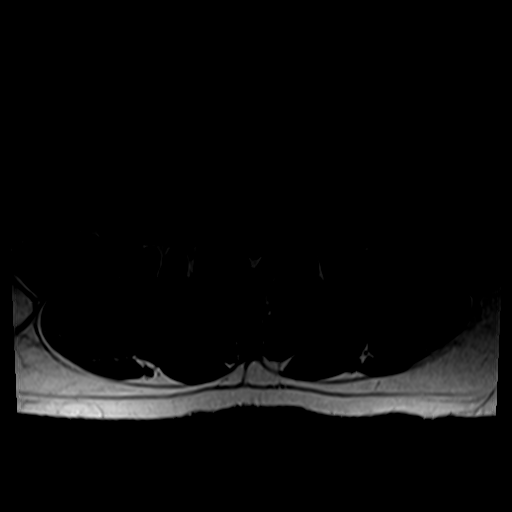
[im 34/40]
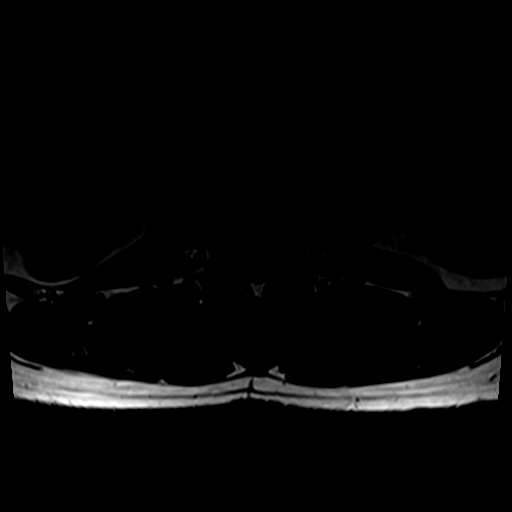

[26 of 48 positions shown; findings below may reference images not displayed]

FINDINGS: Segmentation:  Standard.

Alignment:  Physiologic.

Vertebrae:  No fracture, evidence of discitis, or bone lesion.

Conus medullaris and cauda equina: Conus extends to the L1-2 level.
Conus and cauda equina appear normal.

Paraspinal and other soft tissues: Negative.

Disc levels:

L1-2: Normal.

L2-3: Tiny central annular fissure and tiny disc bulge slightly
asymmetric to the left with no neural impingement.

L3-4: Small broad-based disc bulge with no neural impingement. Small
annular fissure centrally.

L4-5: Right far lateral annular fissure. No disc bulging or
protrusion. Slight bilateral facet arthritis. No neural impingement.

L5-S1: Small broad-based disc bulge with no neural impingement.
IMPRESSION: 1. Mild degenerative disc disease in the lumbar spine without disc
protrusions or focal neural impingement.
2. No spinal or foraminal stenosis.
3. Minimal facet arthritis at L4-5.

## 2019-04-03 ENCOUNTER — Other Ambulatory Visit: Payer: Self-pay

## 2019-04-03 ENCOUNTER — Ambulatory Visit: Payer: Managed Care, Other (non HMO) | Attending: Anesthesiology | Admitting: Anesthesiology

## 2019-04-03 DIAGNOSIS — Z79891 Long term (current) use of opiate analgesic: Secondary | ICD-10-CM

## 2019-04-03 DIAGNOSIS — M4316 Spondylolisthesis, lumbar region: Secondary | ICD-10-CM

## 2019-04-03 DIAGNOSIS — M47816 Spondylosis without myelopathy or radiculopathy, lumbar region: Secondary | ICD-10-CM

## 2019-04-03 DIAGNOSIS — M47817 Spondylosis without myelopathy or radiculopathy, lumbosacral region: Secondary | ICD-10-CM | POA: Diagnosis not present

## 2019-04-03 DIAGNOSIS — M5432 Sciatica, left side: Secondary | ICD-10-CM

## 2019-04-03 DIAGNOSIS — G894 Chronic pain syndrome: Secondary | ICD-10-CM

## 2019-04-03 DIAGNOSIS — M542 Cervicalgia: Secondary | ICD-10-CM

## 2019-04-03 MED ORDER — HYDROCODONE-ACETAMINOPHEN 5-325 MG PO TABS: 1.0000 | ORAL_TABLET | Freq: Four times a day (QID) | ORAL | 0 refills | Status: DC | PRN

## 2019-04-03 NOTE — Patient Instructions (Addendum)
Virtual Visit via Video Note  I connected with Stephen Wise on @TODAY @ at  1:30 PM EDT by a video enabled telemedicine application and verified that I am speaking with the correct person using two identifiers.  Location: Patient: Home Provider: Pain control center   I discussed the limitations of evaluation and management by telemedicine and the availability of in person appointments. The patient expressed understanding and agreed to proceed.  History of Present Illness: I spoke with Stephen Wise today via video virtual voice calling and he states that he is doing reasonably well.  He did recently aggravate his lower back without symptoms radiating into the calves or buttocks.  He has been using ice and heat with stretching 3 times a day and taking his medications as prescribed and this combination is working well for him.  Otherwise has been in his usual state of health without any significant changes noted.  He denies any diverting or illicit use with his medications and based on our discussion today continues to derive good functional lifestyle improvement with his medicines.    Observations/Objective: Current Outpatient Medications:  .  amphetamine-dextroamphetamine (ADDERALL XR) 20 MG 24 hr capsule, Take 1 capsule (20 mg total) by mouth every morning., Disp: 30 capsule, Rfl: 0 .  esomeprazole (NEXIUM) 40 MG capsule, Take 40 mg by mouth., Disp: , Rfl:  .  HYDROcodone-acetaminophen (NORCO/VICODIN) 5-325 MG tablet, Take 1 tablet by mouth every 6 (six) hours as needed for moderate pain or severe pain., Disp: 90 tablet, Rfl: 0 .  [START ON 05/03/2019] HYDROcodone-acetaminophen (NORCO/VICODIN) 5-325 MG tablet, Take 1 tablet by mouth every 6 (six) hours as needed for moderate pain or severe pain., Disp: 90 tablet, Rfl: 0 .  mesalamine (PENTASA) 500 MG CR capsule, Take 1,000 mg by mouth 3 (three) times daily as needed. , Disp: , Rfl:  .  SUMAtriptan (IMITREX) 100 MG tablet, Take 1 tab earliest onset of  migraine.  May repeat in 2 hours if headache persists or recurs. Maximum 2 tabs/24 hours, Disp: 10 tablet, Rfl: 5 .  topiramate (TOPAMAX) 25 MG tablet, 2 tablets at bedtime, Disp: 60 tablet, Rfl: 3   Assessment and Plan: 1. Lumbosacral spondylosis without myelopathy   2. Sciatica of left side   3. Chronic pain syndrome   4. Long term current use of opiate analgesic   5. Spondylolisthesis of lumbar region   6. Facet arthritis of lumbar region   7. Neck pain   Based upon our discussion today and upon review of the [facility] practitioner database information I am going to refill his medications for July 1 and July 31.  Is a be for 90 tablets of the Vicodin 5 mg strength.  I have encouraged him to continue with the stretching strengthening exercises and if the pain is not better to contact us here the pain control center.  Currently he is not taking any muscle relaxants which could be in order.  He is to also continue follow-up with his primary care physicians for his baseline medical care and we will schedule him for 37-monthreturn   Follow Up Instructions:    I discussed the assessment and treatment plan with the patient. The patient was provided an opportunity to ask questions and all were answered. The patient agreed with the plan and demonstrated an understanding of the instructions.   The patient was advised to call back or seek an in-person evaluation if the symptoms worsen or if the condition fails to improve as anticipated.  I provided 30 minutes of non-face-to-face time during this encounter.   Molli Barrows, MD

## 2019-04-03 NOTE — Progress Notes (Signed)
Virtual Visit via Video Note  I connected with Stephen Wise on @TODAY @ at  1:30 PM EDT by a video enabled telemedicine application and verified that I am speaking with the correct person using two identifiers.  Location: Patient: Home Provider: Pain control center   I discussed the limitations of evaluation and management by telemedicine and the availability of in person appointments. The patient expressed understanding and agreed to proceed.  History of Present Illness: I spoke with Stephen Wise today via video virtual voice calling and he states that he is doing reasonably well.  He did recently aggravate his lower back without symptoms radiating into the calves or buttocks.  He has been using ice and heat with stretching 3 times a day and taking his medications as prescribed and this combination is working well for him.  Otherwise has been in his usual state of health without any significant changes noted.  He denies any diverting or illicit use with his medications and based on our discussion today continues to derive good functional lifestyle improvement with his medicines.    Observations/Objective: Current Outpatient Medications:  .  amphetamine-dextroamphetamine (ADDERALL XR) 20 MG 24 hr capsule, Take 1 capsule (20 mg total) by mouth every morning., Disp: 30 capsule, Rfl: 0 .  esomeprazole (NEXIUM) 40 MG capsule, Take 40 mg by mouth., Disp: , Rfl:  .  HYDROcodone-acetaminophen (NORCO/VICODIN) 5-325 MG tablet, Take 1 tablet by mouth every 6 (six) hours as needed for moderate pain or severe pain., Disp: 90 tablet, Rfl: 0 .  [START ON 05/03/2019] HYDROcodone-acetaminophen (NORCO/VICODIN) 5-325 MG tablet, Take 1 tablet by mouth every 6 (six) hours as needed for moderate pain or severe pain., Disp: 90 tablet, Rfl: 0 .  mesalamine (PENTASA) 500 MG CR capsule, Take 1,000 mg by mouth 3 (three) times daily as needed. , Disp: , Rfl:  .  SUMAtriptan (IMITREX) 100 MG tablet, Take 1 tab earliest onset of  migraine.  May repeat in 2 hours if headache persists or recurs. Maximum 2 tabs/24 hours, Disp: 10 tablet, Rfl: 5 .  topiramate (TOPAMAX) 25 MG tablet, 2 tablets at bedtime, Disp: 60 tablet, Rfl: 3   Assessment and Plan: 1. Lumbosacral spondylosis without myelopathy   2. Sciatica of left side   3. Chronic pain syndrome   4. Long term current use of opiate analgesic   5. Spondylolisthesis of lumbar region   6. Facet arthritis of lumbar region   7. Neck pain   Based upon our discussion today and upon review of the [facility] practitioner database information I am going to refill his medications for July 1 and July 31.  Is a be for 90 tablets of the Vicodin 5 mg strength.  I have encouraged him to continue with the stretching strengthening exercises and if the pain is not better to contact us here the pain control center.  Currently he is not taking any muscle relaxants which could be in order.  He is to also continue follow-up with his primary care physicians for his baseline medical care and we will schedule him for 105-monthreturn   Follow Up Instructions:    I discussed the assessment and treatment plan with the patient. The patient was provided an opportunity to ask questions and all were answered. The patient agreed with the plan and demonstrated an understanding of the instructions.   The patient was advised to call back or seek an in-person evaluation if the symptoms worsen or if the condition fails to improve as anticipated.  I provided 30 minutes of non-face-to-face time during this encounter.   Molli Barrows, MD

## 2019-05-03 ENCOUNTER — Other Ambulatory Visit: Payer: Self-pay | Admitting: Neurology

## 2019-05-03 DIAGNOSIS — G43109 Migraine with aura, not intractable, without status migrainosus: Secondary | ICD-10-CM

## 2019-05-07 ENCOUNTER — Encounter: Payer: Self-pay | Admitting: Neurology

## 2019-05-16 ENCOUNTER — Ambulatory Visit: Payer: Managed Care, Other (non HMO) | Admitting: Neurology

## 2019-05-22 ENCOUNTER — Other Ambulatory Visit: Payer: Self-pay | Admitting: Neurology

## 2019-05-29 ENCOUNTER — Ambulatory Visit: Payer: Managed Care, Other (non HMO) | Attending: Anesthesiology | Admitting: Anesthesiology

## 2019-05-29 ENCOUNTER — Other Ambulatory Visit: Payer: Self-pay

## 2019-05-29 ENCOUNTER — Encounter: Payer: Self-pay | Admitting: Anesthesiology

## 2019-05-29 DIAGNOSIS — G894 Chronic pain syndrome: Secondary | ICD-10-CM

## 2019-05-29 DIAGNOSIS — M47817 Spondylosis without myelopathy or radiculopathy, lumbosacral region: Secondary | ICD-10-CM

## 2019-05-29 DIAGNOSIS — M4316 Spondylolisthesis, lumbar region: Secondary | ICD-10-CM

## 2019-05-29 DIAGNOSIS — M542 Cervicalgia: Secondary | ICD-10-CM

## 2019-05-29 DIAGNOSIS — Z79891 Long term (current) use of opiate analgesic: Secondary | ICD-10-CM

## 2019-05-29 DIAGNOSIS — M5432 Sciatica, left side: Secondary | ICD-10-CM

## 2019-05-29 DIAGNOSIS — M47816 Spondylosis without myelopathy or radiculopathy, lumbar region: Secondary | ICD-10-CM

## 2019-05-29 MED ORDER — HYDROCODONE-ACETAMINOPHEN 5-325 MG PO TABS: 1.0000 | ORAL_TABLET | Freq: Four times a day (QID) | ORAL | 0 refills | Status: DC | PRN

## 2019-05-29 MED ORDER — HYDROCODONE-ACETAMINOPHEN 5-325 MG PO TABS: 1.0000 | ORAL_TABLET | Freq: Four times a day (QID) | ORAL | 0 refills | Status: AC | PRN

## 2019-05-29 NOTE — Progress Notes (Signed)
Virtual Visit via Video Note  I connected with Stephen Wise on 05/29/19 at 12:45 PM EDT by a video enabled telemedicine application and verified that I am speaking with the correct person using two identifiers.  Location: Patient: Home Provider: Pain control center   I discussed the limitations of evaluation and management by telemedicine and the availability of in person appointments. The patient expressed understanding and agreed to proceed.  History of Present Illness: I spoke with Stephen Wise today regarding his low back pain.  No significant changes were noted.  He has been doing reasonably well with his medication management and tolerating this well.  Based on a review today he continues to derive good functional lifestyle improvement with the medications.  He has been doing his exercises as recommended and these continue to help with his core strengthening.  Despite this he continues to have pain of the same quality and characteristic as previous.  No changes are noted.  His strength in lower extremities has been good with no change in bowel or bladder function.    Observations/Objective: Current Outpatient Medications:  .  amphetamine-dextroamphetamine (ADDERALL XR) 20 MG 24 hr capsule, Take 1 capsule (20 mg total) by mouth every morning., Disp: 30 capsule, Rfl: 0 .  esomeprazole (NEXIUM) 40 MG capsule, Take 40 mg by mouth., Disp: , Rfl:  .  [START ON 06/02/2019] HYDROcodone-acetaminophen (NORCO/VICODIN) 5-325 MG tablet, Take 1 tablet by mouth every 6 (six) hours as needed for moderate pain or severe pain., Disp: 90 tablet, Rfl: 0 .  [START ON 07/02/2019] HYDROcodone-acetaminophen (NORCO/VICODIN) 5-325 MG tablet, Take 1 tablet by mouth every 6 (six) hours as needed for moderate pain or severe pain., Disp: 90 tablet, Rfl: 0 .  mesalamine (PENTASA) 500 MG CR capsule, Take 1,000 mg by mouth 3 (three) times daily as needed. , Disp: , Rfl:  .  SUMAtriptan (IMITREX) 100 MG tablet, TAKE 1 TABLET (100  MG TOTAL) BY MOUTH ONCE AS NEEDED FOR UP TO 1 DOSE FOR MIGRAINE. MAY REPEAT IN 2 HOURS IF HEADACHE PERSISTS OR RECURS., Disp: 9 tablet, Rfl: 4 .  topiramate (TOPAMAX) 25 MG tablet, TAKE 2 TABLETS BY MOUTH AT BEDTIME, Disp: 180 tablet, Rfl: 1   Assessment and Plan: 1. Lumbosacral spondylosis without myelopathy   2. Sciatica of left side   3. Chronic pain syndrome   4. Long term current use of opiate analgesic   5. Spondylolisthesis of lumbar region   6. Facet arthritis of lumbar region   7. Neck pain   Based upon our review today and upon discussion with the patient I am going to refill his medications for August 30 and September 29 for his Vicodin 5 mg tablets.  I have reviewed the [facility] practitioner database information and it is appropriate.  I encouraged her to continue with his stretching strengthening exercises and we will schedule him for 57-monthreturn to clinic.  I also want him to continue follow-up with his primary care physicians for his baseline medical care.   Follow Up Instructions:    I discussed the assessment and treatment plan with the patient. The patient was provided an opportunity to ask questions and all were answered. The patient agreed with the plan and demonstrated an understanding of the instructions.   The patient was advised to call back or seek an in-person evaluation if the symptoms worsen or if the condition fails to improve as anticipated.  I provided 30 minutes of non-face-to-face time during this encounter.  Molli Barrows, MD

## 2019-07-22 LAB — HM COLONOSCOPY

## 2019-07-23 MED ORDER — ACETAMINOPHEN 325 MG PO TABS
650.00 | ORAL_TABLET | ORAL | Status: DC
Start: ? — End: 2019-07-23

## 2019-07-23 MED ORDER — HYDROCODONE-ACETAMINOPHEN 5-325 MG PO TABS
1.00 | ORAL_TABLET | ORAL | Status: DC
Start: ? — End: 2019-07-23

## 2019-07-23 MED ORDER — BUTALBITAL-APAP-CAFFEINE 50-325-40 MG PO TABS
1.00 | ORAL_TABLET | ORAL | Status: DC
Start: ? — End: 2019-07-23

## 2019-07-23 MED ORDER — PANTOPRAZOLE SODIUM 40 MG PO TBEC
40.00 | DELAYED_RELEASE_TABLET | ORAL | Status: DC
Start: 2019-07-23 — End: 2019-07-23

## 2019-07-23 MED ORDER — ONDANSETRON 4 MG PO TBDP
4.00 | ORAL_TABLET | ORAL | Status: DC
Start: ? — End: 2019-07-23

## 2019-08-01 ENCOUNTER — Other Ambulatory Visit: Payer: Self-pay

## 2019-08-01 ENCOUNTER — Ambulatory Visit: Payer: Managed Care, Other (non HMO) | Attending: Anesthesiology | Admitting: Anesthesiology

## 2019-08-01 ENCOUNTER — Encounter: Payer: Self-pay | Admitting: Anesthesiology

## 2019-08-01 DIAGNOSIS — M79642 Pain in left hand: Secondary | ICD-10-CM

## 2019-08-01 DIAGNOSIS — Z79891 Long term (current) use of opiate analgesic: Secondary | ICD-10-CM

## 2019-08-01 DIAGNOSIS — G894 Chronic pain syndrome: Secondary | ICD-10-CM

## 2019-08-01 DIAGNOSIS — M5137 Other intervertebral disc degeneration, lumbosacral region: Secondary | ICD-10-CM

## 2019-08-01 DIAGNOSIS — M5432 Sciatica, left side: Secondary | ICD-10-CM

## 2019-08-01 DIAGNOSIS — M47817 Spondylosis without myelopathy or radiculopathy, lumbosacral region: Secondary | ICD-10-CM

## 2019-08-01 DIAGNOSIS — M79641 Pain in right hand: Secondary | ICD-10-CM

## 2019-08-01 DIAGNOSIS — M47816 Spondylosis without myelopathy or radiculopathy, lumbar region: Secondary | ICD-10-CM

## 2019-08-01 MED ORDER — HYDROCODONE-ACETAMINOPHEN 5-325 MG PO TABS: 1.0000 | ORAL_TABLET | Freq: Four times a day (QID) | ORAL | 0 refills | Status: AC | PRN

## 2019-08-08 ENCOUNTER — Ambulatory Visit: Payer: Managed Care, Other (non HMO) | Admitting: Internal Medicine

## 2019-08-08 ENCOUNTER — Encounter: Payer: Self-pay | Admitting: Internal Medicine

## 2019-08-08 ENCOUNTER — Other Ambulatory Visit: Payer: Self-pay

## 2019-08-08 VITALS — BP 130/82 | HR 94 | Temp 97.9°F | Wt 211.0 lb

## 2019-08-08 DIAGNOSIS — D62 Acute posthemorrhagic anemia: Secondary | ICD-10-CM

## 2019-08-08 DIAGNOSIS — K2901 Acute gastritis with bleeding: Secondary | ICD-10-CM | POA: Diagnosis not present

## 2019-08-08 DIAGNOSIS — K50819 Crohn's disease of both small and large intestine with unspecified complications: Secondary | ICD-10-CM | POA: Diagnosis not present

## 2019-08-08 NOTE — Patient Instructions (Signed)
Anemia  Anemia is a condition in which you do not have enough red blood cells or hemoglobin. Hemoglobin is a substance in red blood cells that carries oxygen. When you do not have enough red blood cells or hemoglobin (are anemic), your body cannot get enough oxygen and your organs may not work properly. As a result, you may feel very tired or have other problems. What are the causes? Common causes of anemia include:  Excessive bleeding. Anemia can be caused by excessive bleeding inside or outside the body, including bleeding from the intestine or from periods in women.  Poor nutrition.  Long-lasting (chronic) kidney, thyroid, and liver disease.  Bone marrow disorders.  Cancer and treatments for cancer.  HIV (human immunodeficiency virus) and AIDS (acquired immunodeficiency syndrome).  Treatments for HIV and AIDS.  Spleen problems.  Blood disorders.  Infections, medicines, and autoimmune disorders that destroy red blood cells. What are the signs or symptoms? Symptoms of this condition include:  Minor weakness.  Dizziness.  Headache.  Feeling heartbeats that are irregular or faster than normal (palpitations).  Shortness of breath, especially with exercise.  Paleness.  Cold sensitivity.  Indigestion.  Nausea.  Difficulty sleeping.  Difficulty concentrating. Symptoms may occur suddenly or develop slowly. If your anemia is mild, you may not have symptoms. How is this diagnosed? This condition is diagnosed based on:  Blood tests.  Your medical history.  A physical exam.  Bone marrow biopsy. Your health care provider may also check your stool (feces) for blood and may do additional testing to look for the cause of your bleeding. You may also have other tests, including:  Imaging tests, such as a CT scan or MRI.  Endoscopy.  Colonoscopy. How is this treated? Treatment for this condition depends on the cause. If you continue to lose a lot of blood, you may  need to be treated at a hospital. Treatment may include:  Taking supplements of iron, vitamin S31, or folic acid.  Taking a hormone medicine (erythropoietin) that can help to stimulate red blood cell growth.  Having a blood transfusion. This may be needed if you lose a lot of blood.  Making changes to your diet.  Having surgery to remove your spleen. Follow these instructions at home:  Take over-the-counter and prescription medicines only as told by your health care provider.  Take supplements only as told by your health care provider.  Follow any diet instructions that you were given.  Keep all follow-up visits as told by your health care provider. This is important. Contact a health care provider if:  You develop new bleeding anywhere in the body. Get help right away if:  You are very weak.  You are short of breath.  You have pain in your abdomen or chest.  You are dizzy or feel faint.  You have trouble concentrating.  You have bloody or black, tarry stools.  You vomit repeatedly or you vomit up blood. Summary  Anemia is a condition in which you do not have enough red blood cells or enough of a substance in your red blood cells that carries oxygen (hemoglobin).  Symptoms may occur suddenly or develop slowly.  If your anemia is mild, you may not have symptoms.  This condition is diagnosed with blood tests as well as a medical history and physical exam. Other tests may be needed.  Treatment for this condition depends on the cause of the anemia. This information is not intended to replace advice given to you by  your health care provider. Make sure you discuss any questions you have with your health care provider. Document Released: 10/27/2004 Document Revised: 09/01/2017 Document Reviewed: 10/21/2016 Elsevier Patient Education  2020 [employer].

## 2019-08-08 NOTE — Progress Notes (Signed)
HPI  Pt presents to the clinic today for hospital follow up. He went to [facility] ER 10/15 with c/o abdominal pain and blood in his stool. He has a history of Crohn's. CT abdomen did not show any acute findings. EGD showed superficial gastric non bleeding ulcers. He was tested for H Pylori- results still pending. Colonoscopy did not show any active inflammatory bowel disease. He was transfused 3 units PRBC and received and iron infusion. He was discharged on 10/20 and advised to follow up with his GI doctor. Since discharge, he continues to feel weak and dizzy. He is not sleeping well because he has not adjusted from being in the hospital. His appetite is improving, but not back to baseline. He has had some nausea, but no vomiting. He did have a syncopal episode while getting out the shower on Thursday. He denies abdominal pain or blood in the stool.  He is taking Mesalamine, Pantoprazole and iron as prescribed. He was following with [facility] but has not seen them in 10 years. They advised him to see a [facility] but he reports no one will call them back.   Past Medical History:  Diagnosis Date  . Adult ADHD   . Chronic headaches   . Chronic pain   . Chronic prostatitis   . Condyloma acuminatum of penis   . Crohn disease ([place])    with terminal ileitis  . ED (erectile dysfunction)   . Gastric ulcer   . Inflammatory bowel disease   . Microcytic anemia   . Peyronie's disease     Current Outpatient Medications  Medication Sig Dispense Refill  . amphetamine-dextroamphetamine (ADDERALL XR) 20 MG 24 hr capsule Take 1 capsule (20 mg total) by mouth every morning. 30 capsule 0  . esomeprazole (NEXIUM) 40 MG capsule Take 40 mg by mouth.    Marland Kitchen HYDROcodone-acetaminophen (NORCO/VICODIN) 5-325 MG tablet Take 1 tablet by mouth every 6 (six) hours as needed for moderate pain or severe pain. 90 tablet 0  . [START ON 08/31/2019] HYDROcodone-acetaminophen (NORCO/VICODIN) 5-325 MG tablet Take 1 tablet by mouth every 6  (six) hours as needed for moderate pain or severe pain. 90 tablet 0  . mesalamine (PENTASA) 500 MG CR capsule Take 1,000 mg by mouth 3 (three) times daily as needed.     . SUMAtriptan (IMITREX) 100 MG tablet TAKE 1 TABLET (100 MG TOTAL) BY MOUTH ONCE AS NEEDED FOR UP TO 1 DOSE FOR MIGRAINE. MAY REPEAT IN 2 HOURS IF HEADACHE PERSISTS OR RECURS. 9 tablet 4  . topiramate (TOPAMAX) 25 MG tablet TAKE 2 TABLETS BY MOUTH AT BEDTIME 180 tablet 1   No current facility-administered medications for this visit.     No Known Allergies  Family History  Problem Relation Age of Onset  . Irritable bowel syndrome Mother   . Cirrhosis Father   . Alzheimer's disease Maternal Grandmother     Social History   Socioeconomic History  . Marital status: Married    Spouse name: Dottie  . Number of children: 4  . Years of education: Not on file  . Highest education level: Some college, no degree  Occupational History  . Occupation: Office manager  . Financial resource strain: Not on file  . Food insecurity    Worry: Not on file    Inability: Not on file  . Transportation needs    Medical: Not on file    Non-medical: Not on file  Tobacco Use  . Smoking status: Never  Smoker  . Smokeless tobacco: Never Used  Substance and Sexual Activity  . Alcohol use: No  . Drug use: No  . Sexual activity: Yes  Lifestyle  . Physical activity    Days per week: Not on file    Minutes per session: Not on file  . Stress: Not on file  Relationships  . Social Herbalist on phone: Not on file    Gets together: Not on file    Attends religious service: Not on file    Active member of club or organization: Not on file    Attends meetings of clubs or organizations: Not on file    Relationship status: Not on file  . Intimate partner violence    Fear of current or ex partner: Not on file    Emotionally abused: Not on file    Physically abused: Not on file    Forced sexual activity: Not on file   Other Topics Concern  . Not on file  Social History Narrative   Pt is right handed. He is married, lives in a one story house with a basement. He drinks 3 sodas a day. He is very active at work.    ROS:  Constitutional: Pt reports fatigue. Denies fever, malaise, headache or abrupt weight changes.  Respiratory: Denies difficulty breathing, shortness of breath, cough or sputum production.   Cardiovascular: Denies chest pain, chest tightness, palpitations or swelling in the hands or feet.  Gastrointestinal: Denies abdominal pain, bloating, constipation, diarrhea or blood in the stool.  GU: Denies frequency, urgency, pain with urination, blood in urine, odor or discharge. Musculoskeletal: Pt reports generalized weakness. Denies decrease in range of motion, difficulty with gait, muscle pain or joint pain and swelling.  Skin: Denies redness, rashes, lesions or ulcercations.  Neurological: Denies dizziness, difficulty with memory, difficulty with speech or problems with balance and coordination.   No other specific complaints in a complete review of systems (except as listed in HPI above).  PE:  BP 130/82   Pulse 94   Temp 97.9 F (36.6 C) (Temporal)   Wt 211 lb (95.7 kg)   SpO2 98%   BMI 30.28 kg/m   Wt Readings from Last 3 Encounters:  11/29/18 200 lb (90.7 kg)  09/10/18 200 lb (90.7 kg)  07/10/18 203 lb (92.1 kg)    General: Appears his stated age, well developed, well nourished in NAD. Skin: Pale and intact. Cardiovascular: Normal rate and rhythm. S1,S2 noted.  No murmur, rubs or gallops noted. No JVD or BLE edema.  Pulmonary/Chest: Normal effort and positive vesicular breath sounds. No respiratory distress. No wheezes, rales or ronchi noted.  Abdomen: Soft and nontender. Normal bowel sounds. No distention or masses noted. Musculoskeletal: Strength 5/5 BUE/BLE.No difficulty with gait.  Neurological: Alert and oriented. Psychiatric: Mood and affect normal. Behavior is normal.  Judgment and thought content normal.     Assessment and Plan:  Hospital Follow Up for Bleeding Gastric Ulcer, Hx of Crohn's:  Hospital notes, labs and imaging reviewed He is negative for H Pylori Will check CBC with Diff, CMET, IBC panel, Ferritin, Folate and B12 Referral placed to [facility] for further evaluation  He will return in 1 month for his new pt appt. Webb Silversmith, NP

## 2019-08-09 ENCOUNTER — Telehealth: Payer: Self-pay | Admitting: Internal Medicine

## 2019-08-09 ENCOUNTER — Telehealth: Payer: Self-pay

## 2019-08-09 LAB — CBC WITH DIFFERENTIAL/PLATELET
Basophils Absolute: 0.1 10*3/uL (ref 0.0–0.1)
Basophils Relative: 0.8 % (ref 0.0–3.0)
Eosinophils Absolute: 0.1 10*3/uL (ref 0.0–0.7)
Eosinophils Relative: 1.5 % (ref 0.0–5.0)
HCT: 35.3 % — ABNORMAL LOW (ref 39.0–52.0)
Hemoglobin: 11.1 g/dL — ABNORMAL LOW (ref 13.0–17.0)
Lymphocytes Relative: 20.4 % (ref 12.0–46.0)
Lymphs Abs: 1.6 10*3/uL (ref 0.7–4.0)
MCHC: 31.4 g/dL (ref 30.0–36.0)
MCV: 81.9 fl (ref 78.0–100.0)
Monocytes Absolute: 0.7 10*3/uL (ref 0.1–1.0)
Monocytes Relative: 9.5 % (ref 3.0–12.0)
Neutro Abs: 5.3 10*3/uL (ref 1.4–7.7)
Neutrophils Relative %: 67.8 % (ref 43.0–77.0)
Platelets: 619 10*3/uL — ABNORMAL HIGH (ref 150.0–400.0)
RBC: 4.31 Mil/uL (ref 4.22–5.81)
RDW: 20.8 % — ABNORMAL HIGH (ref 11.5–15.5)
WBC: 7.8 10*3/uL (ref 4.0–10.5)

## 2019-08-09 LAB — COMPREHENSIVE METABOLIC PANEL
ALT: 19 U/L (ref 0–53)
AST: 15 U/L (ref 0–37)
Albumin: 4.4 g/dL (ref 3.5–5.2)
Alkaline Phosphatase: 35 U/L — ABNORMAL LOW (ref 39–117)
BUN: 27 mg/dL — ABNORMAL HIGH (ref 6–23)
CO2: 27 mEq/L (ref 19–32)
Calcium: 9.2 mg/dL (ref 8.4–10.5)
Chloride: 104 mEq/L (ref 96–112)
Creatinine, Ser: 1.24 mg/dL (ref 0.40–1.50)
GFR: 62.33 mL/min (ref >60.00)
Glucose, Bld: 92 mg/dL (ref 70–99)
Potassium: 5 mEq/L (ref 3.5–5.1)
Sodium: 138 mEq/L (ref 135–145)
Total Bilirubin: 0.2 mg/dL (ref 0.2–1.2)
Total Protein: 7.1 g/dL (ref 6.0–8.3)

## 2019-08-09 LAB — VITAMIN B12: Vitamin B-12: 230 pg/mL (ref 211–911)

## 2019-08-09 LAB — IBC PANEL
Iron: 16 ug/dL — ABNORMAL LOW (ref 42–165)
Saturation Ratios: 2.8 % — ABNORMAL LOW (ref 20.0–50.0)
Transferrin: 409 mg/dL — ABNORMAL HIGH (ref 212.0–360.0)

## 2019-08-09 LAB — FERRITIN: Ferritin: 29.2 ng/mL (ref 22.0–322.0)

## 2019-08-09 LAB — FOLATE: Folate: 16 ng/mL (ref >5.9)

## 2019-08-09 NOTE — Telephone Encounter (Signed)
Will need completed OV note to send to [facility].

## 2019-08-09 NOTE — Telephone Encounter (Signed)
fmla papework in [employer] in box For review and signature

## 2019-08-09 NOTE — Telephone Encounter (Signed)
Done, given back to Stephen Wise

## 2019-08-11 ENCOUNTER — Encounter: Payer: Self-pay | Admitting: Internal Medicine

## 2019-08-12 NOTE — Telephone Encounter (Signed)
Patient's HR department called the office. Some parts of the form is incomplete. I called patient to let him know forms need to be updated. We will call once the forms are ready for pick up.

## 2019-08-12 NOTE — Telephone Encounter (Signed)
Left message for patient letting him know FMLA paperwork has been faxed and a copy is at the front desk ready for him to pick up.

## 2019-08-12 NOTE — Telephone Encounter (Signed)
Sent pt a message back letting him know that Governor Specking, and Shirlean Mylar are of the office today. It could be tomorrow before he heard back from someone. I will send this to [place].

## 2019-08-15 NOTE — Telephone Encounter (Signed)
Paperwork faxed Sent my chart message letting pt know paperwork has been faxed

## 2019-08-19 ENCOUNTER — Telehealth: Payer: Self-pay | Admitting: Internal Medicine

## 2019-08-19 NOTE — Telephone Encounter (Signed)
Gave pt a copy of FMLA and Threasa Beards gave him a work note. Cancelled pt appt for 08/21/19

## 2019-08-19 NOTE — Telephone Encounter (Signed)
Patient's wife, Carla Drape, called.  Patient has an appointment on 08/21/19 to see Rollene Fare about returning to work.  Patient's work told him that if he doesn't go back to work tomorrow then he's in jeopardy of losing his job.  Dottie said when she checked with work last week they said Rollene Fare would have to see patient to release him back to work. Dottie said patient's still very weak and pale.  Dottie's asking for Rollene Fare to call her back.

## 2019-08-21 ENCOUNTER — Ambulatory Visit: Payer: Managed Care, Other (non HMO) | Admitting: Internal Medicine

## 2019-09-05 ENCOUNTER — Encounter: Payer: Self-pay | Admitting: Internal Medicine

## 2019-09-05 ENCOUNTER — Other Ambulatory Visit: Payer: Self-pay

## 2019-09-05 ENCOUNTER — Ambulatory Visit (INDEPENDENT_AMBULATORY_CARE_PROVIDER_SITE_OTHER): Payer: Managed Care, Other (non HMO) | Admitting: Internal Medicine

## 2019-09-05 DIAGNOSIS — K50811 Crohn's disease of both small and large intestine with rectal bleeding: Secondary | ICD-10-CM

## 2019-09-05 DIAGNOSIS — F909 Attention-deficit hyperactivity disorder, unspecified type: Secondary | ICD-10-CM | POA: Diagnosis not present

## 2019-09-05 DIAGNOSIS — K219 Gastro-esophageal reflux disease without esophagitis: Secondary | ICD-10-CM | POA: Diagnosis not present

## 2019-09-05 DIAGNOSIS — N486 Induration penis plastica: Secondary | ICD-10-CM

## 2019-09-05 DIAGNOSIS — K582 Mixed irritable bowel syndrome: Secondary | ICD-10-CM

## 2019-09-05 DIAGNOSIS — G894 Chronic pain syndrome: Secondary | ICD-10-CM

## 2019-09-05 DIAGNOSIS — G44221 Chronic tension-type headache, intractable: Secondary | ICD-10-CM | POA: Diagnosis not present

## 2019-09-05 DIAGNOSIS — N529 Male erectile dysfunction, unspecified: Secondary | ICD-10-CM

## 2019-09-05 DIAGNOSIS — K589 Irritable bowel syndrome without diarrhea: Secondary | ICD-10-CM | POA: Insufficient documentation

## 2019-09-05 MED ORDER — BUTALBITAL-APAP-CAFFEINE 50-325-40 MG PO TABS: 1.0000 | ORAL_TABLET | Freq: Four times a day (QID) | ORAL | 2 refills | Status: DC | PRN

## 2019-09-05 NOTE — Assessment & Plan Note (Signed)
Try to identify triggers and avoid them Continue Pantoprazole as prescribed Continue to follow with GI

## 2019-09-05 NOTE — Assessment & Plan Note (Signed)
Rx for Fioricet sent to pharmacy Continue Imitrex as needed for migraine Is no longer following with neurology

## 2019-09-05 NOTE — Assessment & Plan Note (Signed)
He is not interested in medication treatment at this time No longer follows with urology

## 2019-09-05 NOTE — Assessment & Plan Note (Signed)
Continue Adderall as prescribed Continue to follow with neuropsych

## 2019-09-05 NOTE — Assessment & Plan Note (Addendum)
Continue bland diet Continue to follow with GI

## 2019-09-05 NOTE — Assessment & Plan Note (Signed)
Improved with medication therapy He is not interested in additional treatment at this time He is no longer following with urology

## 2019-09-05 NOTE — Assessment & Plan Note (Signed)
Scheduled currently on Monday Continue iron supplement and Pentasa as prescribed Continue to follow-up with GI

## 2019-09-05 NOTE — Progress Notes (Signed)
HPI  Pt presents to the clinic today to establish care and for management of the conditions listed below.  Adult ADHD: Managed on Adderall. He does feel like his dose is effective at this time .He follows with neuro psych in [place].  GERD: He is not sure what triggers this. Managed on Pantoprazole daily and Tums PRN. Upper GI from 07/2018 reviewed.  Frequent Headaches: These occur about a few times a week, migraines occur 1 x week. He reports associated lightheadedness, sensitivity to light and sound, intermittent nausea and vomiting. He takes Imitrex as needed with some relief. He used to take Fioricet with better relief. He no longer follows with [facility].   Chronic Pain: Mainly in his left lower back. He has had xrays and MRI's in the past. He takes Hydrocodone 5-325 mg 3 x day. He follows with Dr. Andree Elk.   Chronic Prostatitis: Currently not issues.  Crohn Disease: Recent hospitalization at [facility]. He is not having any blood in his stool at this time. He is taking and iron supplement and Pentasa as prescribed. He has an appt for a colonoscopy on Monday at [facility].  ED: He has difficulty initiating and maintaining an erection and difficulty ejaculating. He is not taking any medication for this at this time.   IBD: Alternating constipation and diarrhea. He is not taking any medication for this but does consume a bland diet. He follows with [facility].   Peyronie's Disease: He took medication to help with this. He is not having issues sexual intercourse. He followed with Dr. Jacqlyn Larsen.  Flu: never Tetanus: 01/2017 Colon Screening: 07/2019 Vision Screening: annually Dentist: as needed  Past Medical History:  Diagnosis Date  . Adult ADHD   . Chronic headaches   . Chronic pain   . Chronic prostatitis   . Condyloma acuminatum of penis   . Crohn disease ([place])    with terminal ileitis  . ED (erectile dysfunction)   . Gastric ulcer   . Inflammatory bowel disease   . Microcytic  anemia   . Peyronie's disease     Current Outpatient Medications  Medication Sig Dispense Refill  . amphetamine-dextroamphetamine (ADDERALL XR) 20 MG 24 hr capsule Take 1 capsule (20 mg total) by mouth every morning. 30 capsule 0  . ferrous sulfate 325 (65 FE) MG tablet Take 325 mg by mouth daily with breakfast.    . HYDROcodone-acetaminophen (NORCO/VICODIN) 5-325 MG tablet Take 1 tablet by mouth every 6 (six) hours as needed for moderate pain or severe pain. 90 tablet 0  . mesalamine (PENTASA) 500 MG CR capsule Take 1,000 mg by mouth 3 (three) times daily as needed.     . pantoprazole (PROTONIX) 40 MG tablet Take 40 mg by mouth daily.    . SUMAtriptan (IMITREX) 100 MG tablet TAKE 1 TABLET (100 MG TOTAL) BY MOUTH ONCE AS NEEDED FOR UP TO 1 DOSE FOR MIGRAINE. MAY REPEAT IN 2 HOURS IF HEADACHE PERSISTS OR RECURS. 9 tablet 4   No current facility-administered medications for this visit.     No Known Allergies  Family History  Problem Relation Age of Onset  . Irritable bowel syndrome Mother   . Cirrhosis Father   . Alzheimer's disease Maternal Grandmother     Social History   Socioeconomic History  . Marital status: Married    Spouse name: Dottie  . Number of children: 4  . Years of education: Not on file  . Highest education level: Some college, no degree  Occupational  History  . Occupation: Office manager  . Financial resource strain: Not on file  . Food insecurity    Worry: Not on file    Inability: Not on file  . Transportation needs    Medical: Not on file    Non-medical: Not on file  Tobacco Use  . Smoking status: Never Smoker  . Smokeless tobacco: Never Used  Substance and Sexual Activity  . Alcohol use: Yes    Comment: social  . Drug use: No  . Sexual activity: Yes  Lifestyle  . Physical activity    Days per week: Not on file    Minutes per session: Not on file  . Stress: Not on file  Relationships  . Social Herbalist on phone: Not on  file    Gets together: Not on file    Attends religious service: Not on file    Active member of club or organization: Not on file    Attends meetings of clubs or organizations: Not on file    Relationship status: Not on file  . Intimate partner violence    Fear of current or ex partner: Not on file    Emotionally abused: Not on file    Physically abused: Not on file    Forced sexual activity: Not on file  Other Topics Concern  . Not on file  Social History Narrative   Pt is right handed. He is married, lives in a one story house with a basement. He drinks 3 sodas a day. He is very active at work.    ROS:  Constitutional: Pt reports headaches. Denies fever, malaise, fatigue, or abrupt weight changes.  HEENT: Denies eye pain, eye redness, ear pain, ringing in the ears, wax buildup, runny nose, nasal congestion, bloody nose, or sore throat. Respiratory: Denies difficulty breathing, shortness of breath, cough or sputum production.   Cardiovascular: Denies chest pain, chest tightness, palpitations or swelling in the hands or feet.  Gastrointestinal: Pt reports intermittent constipation, diarrhea. Denies abdominal pain, bloating, or blood in the stool.  GU: Pt reports erectile dysfunction. Denies frequency, urgency, pain with urination, blood in urine, odor or discharge. Musculoskeletal: Pt reports low back pain. Denies decrease in range of motion, difficulty with gait, muscle pain or joint swelling.  Skin: Denies redness, rashes, lesions or ulcercations.  Neurological: Denies dizziness, difficulty with memory, difficulty with speech or problems with balance and coordination.  Psych: Denies anxiety, depression, SI/HI.  No other specific complaints in a complete review of systems (except as listed in HPI above).  PE:  BP 130/76   Pulse 97   Temp 97.6 F (36.4 C) (Temporal)   Ht 5' 10.75" (1.797 m)   Wt 205 lb (93 kg)   SpO2 98%   BMI 28.79 kg/m   Wt Readings from Last 3  Encounters:  09/05/19 205 lb (93 kg)  08/08/19 211 lb (95.7 kg)  11/29/18 200 lb (90.7 kg)    General: Appears his stated age, well developed, well nourished in NAD. Cardiovascular: Normal rate and rhythm. S1,S2 noted.  No murmur, rubs or gallops noted.  Pulmonary/Chest: Normal effort and positive vesicular breath sounds. No respiratory distress. No wheezes, rales or ronchi noted.  Abdomen: Soft and nontender. Normal bowel sounds. Musculoskeletal:  No difficulty with gait.  Neurological: Alert and oriented. Cranial nerves II-XII grossly intact. Coordination normal.  Psychiatric: Seems a little distracted this am. Behavior is normal. Judgment and thought content normal.  BMET    Component Value Date/Time   NA 138 08/08/2019 1624   K 5.0 08/08/2019 1624   CL 104 08/08/2019 1624   CO2 27 08/08/2019 1624   GLUCOSE 92 08/08/2019 1624   BUN 27 (H) 08/08/2019 1624   CREATININE 1.24 08/08/2019 1624   CALCIUM 9.2 08/08/2019 1624    Lipid Panel  No results found for: CHOL, TRIG, HDL, CHOLHDL, VLDL, LDLCALC  CBC    Component Value Date/Time   WBC 7.8 08/08/2019 1624   RBC 4.31 08/08/2019 1624   HGB 11.1 (L) 08/08/2019 1624   HCT 35.3 (L) 08/08/2019 1624   PLT 619.0 (H) 08/08/2019 1624   MCV 81.9 08/08/2019 1624   MCHC 31.4 08/08/2019 1624   RDW 20.8 (H) 08/08/2019 1624   LYMPHSABS 1.6 08/08/2019 1624   MONOABS 0.7 08/08/2019 1624   EOSABS 0.1 08/08/2019 1624   BASOSABS 0.1 08/08/2019 1624    Hgb A1C No results found for: HGBA1C   Assessment and Plan:

## 2019-09-05 NOTE — Patient Instructions (Signed)

## 2019-09-05 NOTE — Assessment & Plan Note (Signed)
Continue Hydrocodone for pain management

## 2019-09-25 ENCOUNTER — Other Ambulatory Visit: Payer: Self-pay | Admitting: Neurology

## 2019-09-25 DIAGNOSIS — G43109 Migraine with aura, not intractable, without status migrainosus: Secondary | ICD-10-CM

## 2019-09-26 ENCOUNTER — Encounter: Payer: Self-pay | Admitting: Internal Medicine

## 2019-10-01 ENCOUNTER — Ambulatory Visit: Payer: Managed Care, Other (non HMO) | Attending: Anesthesiology | Admitting: Anesthesiology

## 2019-10-01 ENCOUNTER — Encounter: Payer: Self-pay | Admitting: Anesthesiology

## 2019-10-01 ENCOUNTER — Other Ambulatory Visit: Payer: Self-pay

## 2019-10-01 DIAGNOSIS — M5137 Other intervertebral disc degeneration, lumbosacral region: Secondary | ICD-10-CM

## 2019-10-01 DIAGNOSIS — M47817 Spondylosis without myelopathy or radiculopathy, lumbosacral region: Secondary | ICD-10-CM | POA: Diagnosis not present

## 2019-10-01 DIAGNOSIS — Z79891 Long term (current) use of opiate analgesic: Secondary | ICD-10-CM

## 2019-10-01 DIAGNOSIS — M542 Cervicalgia: Secondary | ICD-10-CM

## 2019-10-01 DIAGNOSIS — G43909 Migraine, unspecified, not intractable, without status migrainosus: Secondary | ICD-10-CM

## 2019-10-01 DIAGNOSIS — G43709 Chronic migraine without aura, not intractable, without status migrainosus: Secondary | ICD-10-CM

## 2019-10-01 DIAGNOSIS — M5432 Sciatica, left side: Secondary | ICD-10-CM

## 2019-10-01 DIAGNOSIS — G894 Chronic pain syndrome: Secondary | ICD-10-CM | POA: Diagnosis not present

## 2019-10-01 DIAGNOSIS — M47816 Spondylosis without myelopathy or radiculopathy, lumbar region: Secondary | ICD-10-CM

## 2019-10-01 HISTORY — DX: Migraine, unspecified, not intractable, without status migrainosus: G43.909

## 2019-10-01 MED ORDER — HYDROCODONE-ACETAMINOPHEN 5-325 MG PO TABS: 1.0000 | ORAL_TABLET | Freq: Four times a day (QID) | ORAL | 0 refills | Status: AC | PRN

## 2019-10-01 MED ORDER — HYDROCODONE-ACETAMINOPHEN 5-325 MG PO TABS: 1.0000 | ORAL_TABLET | Freq: Four times a day (QID) | ORAL | 0 refills | Status: DC | PRN

## 2019-10-01 NOTE — Progress Notes (Signed)
Virtual Visit via Video Note  I connected with Stephen Wise on 10/01/19 at  9:45 AM EST by a video enabled telemedicine application and verified that I am speaking with the correct person using two identifiers.  Location: Patient: Work Provider: Pain control center   I discussed the limitations of evaluation and management by telemedicine and the availability of in person appointments. The patient expressed understanding and agreed to proceed.  History of Present Illness: I spoke with Stephen Wise via video conferencing for his virtual visit today.  He reports that he is doing well in regards to his low back pain.  This is a difficult time of the season for him with increased lifting at work.  He is taking his medications responsibly and denies any side effects with them.  They continue to give him good relief and allow him to function at work and sleep better at night.  Otherwise he is in his usual state of health with no new changes mentioned.  His bowel bladder function have been okay and strength been doing well.  He has been having headaches which are chronic in nature followed by his family practice physician.  He takes Imitrex at the front end of these and generally gets a migraine type headache twice a week especially with stress related events and this is a difficult time of the year for him.  He has been on Fioricet in the past and this is one of the few medications that has been effective at aborting his headaches.  He has had discussions with his primary care physician regarding the associated codeine and his baseline hydrocodone therapy.  When he takes a Fioricet he has no associated side effects or problems when taken in combination with his hydrocodone.    Observations/Objective:  Current Outpatient Medications:  .  amphetamine-dextroamphetamine (ADDERALL XR) 20 MG 24 hr capsule, Take 1 capsule (20 mg total) by mouth every morning., Disp: 30 capsule, Rfl: 0 .   butalbital-acetaminophen-caffeine (FIORICET) 50-325-40 MG tablet, Take 1-2 tablets by mouth every 6 (six) hours as needed for headache., Disp: 30 tablet, Rfl: 2 .  ferrous sulfate 325 (65 FE) MG tablet, Take 325 mg by mouth daily with breakfast., Disp: , Rfl:  .  HYDROcodone-acetaminophen (NORCO/VICODIN) 5-325 MG tablet, Take 1 tablet by mouth every 6 (six) hours as needed for moderate pain or severe pain., Disp: 90 tablet, Rfl: 0 .  [START ON 10/31/2019] HYDROcodone-acetaminophen (NORCO/VICODIN) 5-325 MG tablet, Take 1 tablet by mouth every 6 (six) hours as needed for moderate pain or severe pain., Disp: 90 tablet, Rfl: 0 .  mesalamine (PENTASA) 500 MG CR capsule, Take 1,000 mg by mouth 3 (three) times daily as needed. , Disp: , Rfl:  .  pantoprazole (PROTONIX) 40 MG tablet, Take 40 mg by mouth daily., Disp: , Rfl:  .  SUMAtriptan (IMITREX) 100 MG tablet, TAKE 1 TABLET (100 MG TOTAL) BY MOUTH ONCE AS NEEDED FOR UP TO 1 DOSE FOR MIGRAINE. MAY REPEAT IN 2 HOURS IF HEADACHE PERSISTS OR RECURS., Disp: 9 tablet, Rfl: 4  Assessment and Plan: 1. Lumbosacral spondylosis without myelopathy   2. Chronic pain syndrome   3. Long term current use of opiate analgesic   4. Sciatica of left side   5. Facet arthritis of lumbar region   6. Disc disease, degenerative, lumbar or lumbosacral   7. Neck pain   8. Chronic migraine without aura without status migrainosus, not intractable   Based on our discussion today and upon my  review with Stephen Wise were going to refill his medications for the next 2 months on his hydrocodone.  I have reviewed the [facility] practitioner database information and it is appropriate.  I do not believe it is a problem with his current usage of the butalbital for his migraine management.  He generally takes Imitrex to help abort these headaches but generally requires Fioricet approximately 2-4 times a week.  He reports no problems with the 2 medications taken concomitantly and I think that  this is acceptable.  I have discussed the issue regarding dose escalation with the Fioricet and would like to see him limit his usage to no more than 20 tablets/month if possible.  He can continue with the Imitrex as he is doing so and continue follow-up with his primary care physician for continued headache management.  If he should have further problems with the pain management component of his low back pain he is instructed to contact us at the pain control center for further management.  We will schedule him for 74-monthreturn to clinic.  Follow Up Instructions:    I discussed the assessment and treatment plan with the patient. The patient was provided an opportunity to ask questions and all were answered. The patient agreed with the plan and demonstrated an understanding of the instructions.   The patient was advised to call back or seek an in-person evaluation if the symptoms worsen or if the condition fails to improve as anticipated.  I provided 30 minutes of non-face-to-face time during this encounter.   JMolli Barrows MD

## 2019-10-16 ENCOUNTER — Telehealth: Payer: Self-pay | Admitting: Internal Medicine

## 2019-10-22 NOTE — Telephone Encounter (Signed)
Pt's wife called in to see why pharmacy wouldn't refill this prescription. I advised her it was requested too soon. She states that he does need a refill on it.

## 2019-10-23 ENCOUNTER — Encounter: Payer: Self-pay | Admitting: Internal Medicine

## 2019-10-23 ENCOUNTER — Ambulatory Visit (INDEPENDENT_AMBULATORY_CARE_PROVIDER_SITE_OTHER): Payer: Managed Care, Other (non HMO) | Admitting: Internal Medicine

## 2019-10-23 ENCOUNTER — Other Ambulatory Visit: Payer: Self-pay

## 2019-10-23 DIAGNOSIS — R519 Headache, unspecified: Secondary | ICD-10-CM | POA: Diagnosis not present

## 2019-10-23 MED ORDER — BUTALBITAL-APAP-CAFFEINE 50-325-40 MG PO TABS: 1.0000 | ORAL_TABLET | Freq: Four times a day (QID) | ORAL | 0 refills | Status: AC | PRN

## 2019-10-23 NOTE — Patient Instructions (Signed)
Analgesic Rebound Headache An analgesic rebound headache, sometimes called a medication overuse headache, is a headache that comes after pain medicine (analgesic) taken to treat the original (primary) headache has worn off. Any type of primary headache can return as a rebound headache if a person regularly takes analgesics more than three times a week to treat it. The types of primary headaches that are commonly associated with rebound headaches include:  Migraines.  Headaches that arise from tense muscles in the head and neck area (tension headaches).  Headaches that develop and happen again (recur) on one side of the head and around the eye (cluster headaches). If rebound headaches continue, they become chronic daily headaches. What are the causes? This condition may be caused by frequent use of:  Over-the-counter medicines such as aspirin, ibuprofen, and acetaminophen.  Sinus relief medicines and other medicines that contain caffeine.  Narcotic pain medicines such as codeine and oxycodone. What are the signs or symptoms? The symptoms of a rebound headache are the same as the symptoms of the original headache. Some of the symptoms of specific types of headaches include: Migraine headache  Pulsing or throbbing pain on one or both sides of the head.  Severe pain that interferes with daily activities.  Pain that is worsened by physical activity.  Nausea, vomiting, or both.  Pain with exposure to bright light, loud noises, or strong smells.  General sensitivity to bright light, loud noises, or strong smells.  Visual changes.  Numbness of one or both arms. Tension headache  Pressure around the head.  Dull, aching head pain.  Pain felt over the front and sides of the head.  Tenderness in the muscles of the head, neck, and shoulders. Cluster headache  Severe pain that begins in or around one eye or temple.  Redness and tearing in the eye on the same side as the  pain.  Droopy or swollen eyelid.  One-sided head pain.  Nausea.  Runny nose.  Sweaty, pale facial skin.  Restlessness. How is this diagnosed? This condition is diagnosed by:  Reviewing your medical history. This includes the nature of your primary headaches.  Reviewing the types of pain medicines that you have been using to treat your headaches and how often you take them. How is this treated? This condition may be treated or managed by:  Discontinuing frequent use of the analgesic medicine. Doing this may worsen your headaches at first, but the pain should eventually become more manageable, less frequent, and less severe.  Seeing a headache specialist. He or she may be able to help you manage your headaches and help make sure there is not another cause of the headaches.  Using methods of stress relief, such as acupuncture, counseling, biofeedback, and massage. Talk with your health care provider about which methods might be good for you. Follow these instructions at home:  Take over-the-counter and prescription medicines only as told by your health care provider.  Stop the repeated use of pain medicine as told by your health care provider. Stopping can be difficult. Carefully follow instructions from your health care provider.  Avoid triggers that are known to cause your primary headaches.  Keep all follow-up visits as told by your health care provider. This is important. Contact a health care provider if:  You continue to experience headaches after following treatments that your health care provider recommended. Get help right away if:  You develop new headache pain.  You develop headache pain that is different than what you have  experienced in the past.  You develop numbness or tingling in your arms or legs.  You develop changes in your speech or vision. This information is not intended to replace advice given to you by your health care provider. Make sure you  discuss any questions you have with your health care provider. Document Revised: 09/01/2017 Document Reviewed: 02/22/2016 Elsevier Patient Education  2020 [employer].

## 2019-10-23 NOTE — Progress Notes (Signed)
Virtual Visit via Video Note  I connected with Stephen Wise on 10/23/19 at  2:00 PM EST by a video enabled telemedicine application and verified that I am speaking with the correct person using two identifiers.  Location: Patient: In his care Provider: Office   I discussed the limitations of evaluation and management by telemedicine and the availability of in person appointments. The patient expressed understanding and agreed to proceed.  History of Present Illness:  Pt reports worsening headaches. They are occurring daily. They are located above his left eye and radiates into his left temple, sometimes all the way around his head. He describes the pain as sharp and stabbing. He reports associated sensitivity to light and sound, visual changes and nausea. He denies dizziness, or vomiting. He does report increased stress and thinks this may be a contributing factor. He is unable to take NSAID's OTC due to chronic GI issues. He is taking Fioricet which does provide some relief. He feels like he had better relief with Fioricet with Codeine in the past, but I did not give him a RX for this as he is currently taking Hydrocodone daily. He reports he has failed preventative therapy in the past with Topamax, Nortriptyline and Propanolol. He has not been filling the Imitrex in the last month because the copay is >100$. He has used 90 tablets of Fioricet in the last 6 weeks. He has seen Dr. Loretta Plume in the past for the same.   Past Medical History:  Diagnosis Date  . Adult ADHD   . Chronic headaches   . Chronic pain   . Chronic prostatitis   . Condyloma acuminatum of penis   . Crohn disease ([place])    with terminal ileitis  . ED (erectile dysfunction)   . Gastric ulcer   . Inflammatory bowel disease   . Microcytic anemia   . Migraines 10/01/2019  . Peyronie's disease     Current Outpatient Medications  Medication Sig Dispense Refill  . amphetamine-dextroamphetamine (ADDERALL XR) 20 MG 24 hr capsule  Take 1 capsule (20 mg total) by mouth every morning. 30 capsule 0  . butalbital-acetaminophen-caffeine (FIORICET) 50-325-40 MG tablet Take 1-2 tablets by mouth every 6 (six) hours as needed for headache. 30 tablet 2  . ferrous sulfate 325 (65 FE) MG tablet Take 325 mg by mouth daily with breakfast.    . HYDROcodone-acetaminophen (NORCO/VICODIN) 5-325 MG tablet Take 1 tablet by mouth every 6 (six) hours as needed for moderate pain or severe pain. 90 tablet 0  . [START ON 10/31/2019] HYDROcodone-acetaminophen (NORCO/VICODIN) 5-325 MG tablet Take 1 tablet by mouth every 6 (six) hours as needed for moderate pain or severe pain. 90 tablet 0  . mesalamine (PENTASA) 500 MG CR capsule Take 1,000 mg by mouth 3 (three) times daily as needed.     . pantoprazole (PROTONIX) 40 MG tablet Take 40 mg by mouth daily.    . SUMAtriptan (IMITREX) 100 MG tablet TAKE 1 TABLET (100 MG TOTAL) BY MOUTH ONCE AS NEEDED FOR UP TO 1 DOSE FOR MIGRAINE. MAY REPEAT IN 2 HOURS IF HEADACHE PERSISTS OR RECURS. 9 tablet 4   No current facility-administered medications for this visit.    No Known Allergies  Family History  Problem Relation Age of Onset  . Irritable bowel syndrome Mother   . Cirrhosis Father   . Alzheimer's disease Maternal Grandmother     Social History   Socioeconomic History  . Marital status: Married    Spouse name: Dottie  .  Number of children: 4  . Years of education: Not on file  . Highest education level: Some college, no degree  Occupational History  . Occupation: Warehouse  Tobacco Use  . Smoking status: Never Smoker  . Smokeless tobacco: Never Used  Substance and Sexual Activity  . Alcohol use: Yes    Comment: social  . Drug use: No  . Sexual activity: Yes  Other Topics Concern  . Not on file  Social History Narrative   Pt is right handed. He is married, lives in a one story house with a basement. He drinks 3 sodas a day. He is very active at work.   Social Determinants of Health    Financial Resource Strain:   . Difficulty of Paying Living Expenses: Not on file  Food Insecurity:   . Worried About Charity fundraiser in the Last Year: Not on file  . Ran Out of Food in the Last Year: Not on file  Transportation Needs:   . Lack of Transportation (Medical): Not on file  . Lack of Transportation (Non-Medical): Not on file  Physical Activity:   . Days of Exercise per Week: Not on file  . Minutes of Exercise per Session: Not on file  Stress:   . Feeling of Stress : Not on file  Social Connections:   . Frequency of Communication with Friends and Family: Not on file  . Frequency of Social Gatherings with Friends and Family: Not on file  . Attends Religious Services: Not on file  . Active Member of Clubs or Organizations: Not on file  . Attends Archivist Meetings: Not on file  . Marital Status: Not on file  Intimate Partner Violence:   . Fear of Current or Ex-Partner: Not on file  . Emotionally Abused: Not on file  . Physically Abused: Not on file  . Sexually Abused: Not on file     Constitutional: Pt reports headaches. Denies fever, malaise, fatigue, or abrupt weight changes.  HEENT: Pt reports sensitivity to light and sound. Denies eye pain, eye redness, ear pain, ringing in the ears, wax buildup, runny nose, nasal congestion, bloody nose, or sore throat. Respiratory: Denies difficulty breathing, shortness of breath, cough or sputum production.   Cardiovascular: Denies chest pain, chest tightness, palpitations or swelling in the hands or feet.  Gastrointestinal: Pt reports nausea. Denies abdominal pain, bloating, constipation, diarrhea or blood in the stool.  Neurological: Denies dizziness, difficulty with memory, difficulty with speech or problems with balance and coordination.  Psych: Pt reports stress. Denies anxiety, depression, SI/HI.  No other specific complaints in a complete review of systems (except as listed in HPI above).     Observations/Objective:  Wt Readings from Last 3 Encounters:  09/05/19 205 lb (93 kg)  08/08/19 211 lb (95.7 kg)  11/29/18 200 lb (90.7 kg)    General: Appears his stated age, well developed, well nourished in NAD. HEENT: Head: normal shape and size; Eyes:  EOMs intact; Pulmonary/Chest: Normal effort. No respiratory distress.  Neurological: Alert and oriented.  Coordination normal.  Psychiatric: Anxious appearing. Judgment and thought content normal.     BMET    Component Value Date/Time   NA 138 08/08/2019 1624   K 5.0 08/08/2019 1624   CL 104 08/08/2019 1624   CO2 27 08/08/2019 1624   GLUCOSE 92 08/08/2019 1624   BUN 27 (H) 08/08/2019 1624   CREATININE 1.24 08/08/2019 1624   CALCIUM 9.2 08/08/2019 1624    Lipid Panel  No results found for: CHOL, TRIG, HDL, CHOLHDL, VLDL, LDLCALC  CBC    Component Value Date/Time   WBC 7.8 08/08/2019 1624   RBC 4.31 08/08/2019 1624   HGB 11.1 (L) 08/08/2019 1624   HCT 35.3 (L) 08/08/2019 1624   PLT 619.0 (H) 08/08/2019 1624   MCV 81.9 08/08/2019 1624   MCHC 31.4 08/08/2019 1624   RDW 20.8 (H) 08/08/2019 1624   LYMPHSABS 1.6 08/08/2019 1624   MONOABS 0.7 08/08/2019 1624   EOSABS 0.1 08/08/2019 1624   BASOSABS 0.1 08/08/2019 1624    Hgb A1C No results found for: HGBA1C    Assessment and Plan:  Frequent Headaches:  Hard to determine if these are tension vs medication overuse headaches Will refill Fioricet, advised him he needs to make this last until he follows up with neurology Encouraged alternative meds like Emgality vs Botox Advised him to call neurology and schedule follow up ASAP  Return precautions discussed Follow Up Instructions:    I discussed the assessment and treatment plan with the patient. The patient was provided an opportunity to ask questions and all were answered. The patient agreed with the plan and demonstrated an understanding of the instructions.   The patient was advised to call back or seek  an in-person evaluation if the symptoms worsen or if the condition fails to improve as anticipated.    Webb Silversmith, NP

## 2019-11-08 ENCOUNTER — Telehealth: Payer: Self-pay | Admitting: Neurology

## 2019-11-08 ENCOUNTER — Other Ambulatory Visit: Payer: Self-pay

## 2019-11-08 DIAGNOSIS — G43109 Migraine with aura, not intractable, without status migrainosus: Secondary | ICD-10-CM

## 2019-11-08 MED ORDER — SUMATRIPTAN SUCCINATE 100 MG PO TABS: ORAL_TABLET | ORAL | 4 refills | Status: AC

## 2019-11-08 NOTE — Telephone Encounter (Addendum)
Patient called and left a message requesting to check on the status of his Imitrex refill. He said he's been trying to get it since last Monday.  [facility] [place]

## 2019-11-11 NOTE — Telephone Encounter (Signed)
Called [facility] @ [facility] and verified that patient picked up medication on 11/08/19. This has been completed.

## 2019-11-11 NOTE — Telephone Encounter (Signed)
Has this been completed?  Sending to clinical staff for review: Okay to sign/close encounter or is further follow up needed?

## 2019-11-27 ENCOUNTER — Telehealth: Payer: Self-pay | Admitting: *Deleted

## 2019-11-27 NOTE — Telephone Encounter (Signed)
Patient's wife left a voicemail stating that Webb Silversmith NP is aware of all of the problems that her husband is having at work. Patient's wife wants to know if Rollene Fare will prescribe him something for the anxiety that he is having since she is aware of his problem?  Dottie stated that you can call her back or her husband. Pharmacy [place]

## 2019-11-28 NOTE — Telephone Encounter (Signed)
Left detailed msg on VM per HIPAA Letting pt know that Rollene Fare has to evaluate and get more infromation to support treatment or referral... Virtual visit okay---30 mins

## 2019-11-28 NOTE — Telephone Encounter (Signed)
I do know what is going on but he still needs a visit so we can discuss. I need to know if he has had issues with anxiety before, what he's tried etc.

## 2019-11-29 ENCOUNTER — Encounter: Payer: Self-pay | Admitting: Anesthesiology

## 2019-11-29 ENCOUNTER — Other Ambulatory Visit: Payer: Self-pay

## 2019-11-29 ENCOUNTER — Ambulatory Visit: Payer: Managed Care, Other (non HMO) | Attending: Anesthesiology | Admitting: Anesthesiology

## 2019-11-29 DIAGNOSIS — G894 Chronic pain syndrome: Secondary | ICD-10-CM | POA: Diagnosis not present

## 2019-11-29 DIAGNOSIS — M47816 Spondylosis without myelopathy or radiculopathy, lumbar region: Secondary | ICD-10-CM

## 2019-11-29 DIAGNOSIS — Z79891 Long term (current) use of opiate analgesic: Secondary | ICD-10-CM

## 2019-11-29 DIAGNOSIS — M79641 Pain in right hand: Secondary | ICD-10-CM

## 2019-11-29 DIAGNOSIS — M47817 Spondylosis without myelopathy or radiculopathy, lumbosacral region: Secondary | ICD-10-CM

## 2019-11-29 DIAGNOSIS — G43709 Chronic migraine without aura, not intractable, without status migrainosus: Secondary | ICD-10-CM

## 2019-11-29 DIAGNOSIS — M79642 Pain in left hand: Secondary | ICD-10-CM

## 2019-11-29 DIAGNOSIS — M5432 Sciatica, left side: Secondary | ICD-10-CM

## 2019-11-29 DIAGNOSIS — M542 Cervicalgia: Secondary | ICD-10-CM

## 2019-11-29 MED ORDER — HYDROCODONE-ACETAMINOPHEN 5-325 MG PO TABS: 1.0000 | ORAL_TABLET | Freq: Four times a day (QID) | ORAL | 0 refills | Status: DC | PRN

## 2019-12-02 ENCOUNTER — Telehealth: Payer: Self-pay | Admitting: Anesthesiology

## 2019-12-02 NOTE — Telephone Encounter (Signed)
Patient lvmail stating he did not get call from Dr. Andree Elk. This account is still open if you can call him today Dr. Andree Elk. Or let me know if I need to cancel and reschedul

## 2019-12-04 NOTE — Progress Notes (Signed)
Virtual Visit via Telephone Note  I connected with Stephen Wise on 12/04/19 at 12:00 PM EST by telephone and verified that I am speaking with the correct person using two identifiers.  Location: Patient: Home Provider: Pain control center   I discussed the limitations, risks, security and privacy concerns of performing an evaluation and management service by telephone and the availability of in person appointments. I also discussed with the patient that there may be a patient responsible charge related to this service. The patient expressed understanding and agreed to proceed.   History of Present Illness: I spoke with [name] via telephone.  He was unable to do the video portion of the virtual conference.  He reports that he has been doing reasonably well with his low back pain as of late.  He is continuing to do his stretching and strengthening exercises.  This is keeping his pain manageable and in addition to his current medication management he is doing reasonably well he reports.  The quality characteristic and distribution of his low back pain are otherwise unchanged.  He continues to be aggravated by some of the heavy lifting he does with his work and the medications help keep his pain under decent control.  He reports good overall functional lifestyle improvement with the medicines.  No untoward side effects or mention today.  He still doing his exercises as described today.  No change in bowel or bladder function or lower extremity strength or function is noted.  The pain is primarily in the lower back with radiation into the hips buttocks and occasionally into the posterior lateral legs.    Observations/Objective:  Current Outpatient Medications:  .  amphetamine-dextroamphetamine (ADDERALL XR) 20 MG 24 hr capsule, Take 1 capsule (20 mg total) by mouth every morning., Disp: 30 capsule, Rfl: 0 .  ferrous sulfate 325 (65 FE) MG tablet, Take 325 mg by mouth daily with breakfast., Disp: , Rfl:   .  [START ON 12/29/2019] HYDROcodone-acetaminophen (NORCO/VICODIN) 5-325 MG tablet, Take 1 tablet by mouth every 6 (six) hours as needed for moderate pain or severe pain., Disp: 90 tablet, Rfl: 0 .  HYDROcodone-acetaminophen (NORCO/VICODIN) 5-325 MG tablet, Take 1 tablet by mouth every 6 (six) hours as needed for moderate pain or severe pain., Disp: 90 tablet, Rfl: 0 .  mesalamine (PENTASA) 500 MG CR capsule, Take 1,000 mg by mouth 3 (three) times daily as needed. , Disp: , Rfl:  .  pantoprazole (PROTONIX) 40 MG tablet, Take 40 mg by mouth daily., Disp: , Rfl:  .  SUMAtriptan (IMITREX) 100 MG tablet, May repeat in 2 hours if headache persists or recurs., Disp: 9 tablet, Rfl: 4  Assessment and Plan: 1. Lumbosacral spondylosis without myelopathy   2. Chronic pain syndrome   3. Long term current use of opiate analgesic   4. Sciatica of left side   5. Facet arthritis of lumbar region   6. Neck pain   7. Chronic migraine without aura without status migrainosus, not intractable   8. Pain in both hands   Based on our discussion today and upon review of the [facility] practitioner database information I am going to refill his medications for the next 2 months.  I want him to continue with the stretching strengthening exercises as reviewed and continue follow-up with his primary care physicians for his baseline medical care.  If he should have any problems with his pain management in the next 2 months he is instructed to contact us.  Follow  Up Instructions:    I discussed the assessment and treatment plan with the patient. The patient was provided an opportunity to ask questions and all were answered. The patient agreed with the plan and demonstrated an understanding of the instructions.   The patient was advised to call back or seek an in-person evaluation if the symptoms worsen or if the condition fails to improve as anticipated.  I provided 30 minutes of non-face-to-face time during this  encounter.   Molli Barrows, MD

## 2020-01-28 ENCOUNTER — Encounter: Payer: Self-pay | Admitting: Anesthesiology

## 2020-01-28 ENCOUNTER — Ambulatory Visit: Payer: Managed Care, Other (non HMO) | Attending: Anesthesiology | Admitting: Anesthesiology

## 2020-01-28 DIAGNOSIS — M5432 Sciatica, left side: Secondary | ICD-10-CM

## 2020-01-28 DIAGNOSIS — M4316 Spondylolisthesis, lumbar region: Secondary | ICD-10-CM

## 2020-01-28 DIAGNOSIS — M47817 Spondylosis without myelopathy or radiculopathy, lumbosacral region: Secondary | ICD-10-CM | POA: Diagnosis not present

## 2020-01-28 DIAGNOSIS — G894 Chronic pain syndrome: Secondary | ICD-10-CM | POA: Diagnosis not present

## 2020-01-28 DIAGNOSIS — G43709 Chronic migraine without aura, not intractable, without status migrainosus: Secondary | ICD-10-CM

## 2020-01-28 DIAGNOSIS — Z79891 Long term (current) use of opiate analgesic: Secondary | ICD-10-CM | POA: Diagnosis not present

## 2020-01-28 DIAGNOSIS — M47816 Spondylosis without myelopathy or radiculopathy, lumbar region: Secondary | ICD-10-CM

## 2020-01-28 DIAGNOSIS — M542 Cervicalgia: Secondary | ICD-10-CM

## 2020-01-29 ENCOUNTER — Telehealth: Payer: Self-pay | Admitting: *Deleted

## 2020-01-29 ENCOUNTER — Other Ambulatory Visit: Payer: Self-pay | Admitting: Anesthesiology

## 2020-01-29 NOTE — Telephone Encounter (Signed)
Prescriptions not sent. Will you check on this for Mr Hijazi

## 2020-01-30 ENCOUNTER — Encounter: Payer: Self-pay | Admitting: Anesthesiology

## 2020-01-30 MED ORDER — HYDROCODONE-ACETAMINOPHEN 5-325 MG PO TABS: 1.0000 | ORAL_TABLET | Freq: Four times a day (QID) | ORAL | 0 refills | Status: AC | PRN

## 2020-01-30 NOTE — Progress Notes (Signed)
Virtual Visit via Telephone Note  I connected with Stephen Wise on 01/30/20 at  2:00 PM EDT by telephone and verified that I am speaking with the correct person using two identifiers.  Location: Patient: Home Provider: Pain control center   I discussed the limitations, risks, security and privacy concerns of performing an evaluation and management service by telephone and the availability of in person appointments. I also discussed with the patient that there may be a patient responsible charge related to this service. The patient expressed understanding and agreed to proceed.   History of Present Illness: I spoke with Stephen Wise via telephone as he was unable to do the video portion of the virtual conference.  He reports that his low back pain is been stable in nature.  It generally is worse with increased activity at work.  This fluctuates throughout the day and the year.  He takes his medications as prescribed and these are working well for him.  They enable him to continue with work and sleep better at night.  Based on our discussion today he continues to derive good functional benefits from chronic opioid management therapy.  Unfortunately he has failed more conservative therapy.  He continues to do his stretching strengthening exercises as requested and the quality characteristic and distribution of the pain are otherwise unchanged.    Observations/Objective:   Current Outpatient Medications:  .  amphetamine-dextroamphetamine (ADDERALL XR) 20 MG 24 hr capsule, Take 1 capsule (20 mg total) by mouth every morning., Disp: 30 capsule, Rfl: 0 .  ferrous sulfate 325 (65 FE) MG tablet, Take 325 mg by mouth daily with breakfast., Disp: , Rfl:  .  HYDROcodone-acetaminophen (NORCO/VICODIN) 5-325 MG tablet, Take 1 tablet by mouth every 6 (six) hours as needed for moderate pain or severe pain., Disp: 90 tablet, Rfl: 0 .  [START ON 02/27/2020] HYDROcodone-acetaminophen (NORCO/VICODIN) 5-325 MG tablet,  Take 1 tablet by mouth every 6 (six) hours as needed for moderate pain or severe pain., Disp: 90 tablet, Rfl: 0 .  mesalamine (PENTASA) 500 MG CR capsule, Take 1,000 mg by mouth 3 (three) times daily as needed. , Disp: , Rfl:  .  pantoprazole (PROTONIX) 40 MG tablet, Take 40 mg by mouth daily., Disp: , Rfl:  .  SUMAtriptan (IMITREX) 100 MG tablet, May repeat in 2 hours if headache persists or recurs., Disp: 9 tablet, Rfl: 4 Assessment and Plan:  1. Lumbosacral spondylosis without myelopathy   2. Chronic pain syndrome   3. Long term current use of opiate analgesic   4. Sciatica of left side   5. Facet arthritis of lumbar region   6. Neck pain   7. Chronic migraine without aura without status migrainosus, not intractable   8. Spondylolisthesis of lumbar region   Based on our discussion today upon review of the [facility] practitioner database information going to refill his medications for the next 2 months.  Is will be dated for April and May.  I want him to continue with core stretching strengthening exercises as previously reviewed with return to clinic in 2 months.  He is encouraged to continue follow-up with his primary care physicians for his baseline medical care.  He is instructed to contact us at the pain control center should he have any problems with his pain management. Follow Up Instructions:    I discussed the assessment and treatment plan with the patient. The patient was provided an opportunity to ask questions and all were answered. The patient agreed with  the plan and demonstrated an understanding of the instructions.   The patient was advised to call back or seek an in-person evaluation if the symptoms worsen or if the condition fails to improve as anticipated.  I provided 25 minutes of non-face-to-face time during this encounter.   Molli Barrows, MD

## 2020-01-30 NOTE — Telephone Encounter (Signed)
Pharmacy didn't receive the prescriptions. He is out of meds.

## 2020-02-20 ENCOUNTER — Encounter: Payer: Self-pay | Admitting: Internal Medicine

## 2020-02-20 MED ORDER — BUTALBITAL-APAP-CAFF-COD 50-325-40-30 MG PO CAPS: 1.0000 | ORAL_CAPSULE | Freq: Three times a day (TID) | ORAL | 0 refills | Status: DC | PRN

## 2020-03-05 ENCOUNTER — Other Ambulatory Visit: Payer: Self-pay

## 2020-03-05 ENCOUNTER — Ambulatory Visit (INDEPENDENT_AMBULATORY_CARE_PROVIDER_SITE_OTHER): Payer: Managed Care, Other (non HMO) | Admitting: Internal Medicine

## 2020-03-05 ENCOUNTER — Encounter: Payer: Self-pay | Admitting: Internal Medicine

## 2020-03-05 VITALS — BP 138/84 | HR 76 | Temp 98.1°F | Ht 70.25 in | Wt 206.0 lb

## 2020-03-05 DIAGNOSIS — Z0000 Encounter for general adult medical examination without abnormal findings: Secondary | ICD-10-CM

## 2020-03-05 LAB — CBC
HCT: 34.1 % — ABNORMAL LOW (ref 39.0–52.0)
Hemoglobin: 10.6 g/dL — ABNORMAL LOW (ref 13.0–17.0)
MCHC: 31.1 g/dL (ref 30.0–36.0)
MCV: 76.3 fl — ABNORMAL LOW (ref 78.0–100.0)
Platelets: 345 10*3/uL (ref 150.0–400.0)
RBC: 4.47 Mil/uL (ref 4.22–5.81)
RDW: 15.7 % — ABNORMAL HIGH (ref 11.5–15.5)
WBC: 5.1 10*3/uL (ref 4.0–10.5)

## 2020-03-05 LAB — COMPREHENSIVE METABOLIC PANEL
ALT: 19 U/L (ref 0–53)
AST: 17 U/L (ref 0–37)
Albumin: 4.2 g/dL (ref 3.5–5.2)
Alkaline Phosphatase: 40 U/L (ref 39–117)
BUN: 23 mg/dL (ref 6–23)
CO2: 27 mEq/L (ref 19–32)
Calcium: 8.8 mg/dL (ref 8.4–10.5)
Chloride: 105 mEq/L (ref 96–112)
Creatinine, Ser: 0.91 mg/dL (ref 0.40–1.50)
GFR: 88.86 mL/min (ref >60.00)
Glucose, Bld: 76 mg/dL (ref 70–99)
Potassium: 4.4 mEq/L (ref 3.5–5.1)
Sodium: 135 mEq/L (ref 135–145)
Total Bilirubin: 0.2 mg/dL (ref 0.2–1.2)
Total Protein: 6.5 g/dL (ref 6.0–8.3)

## 2020-03-05 LAB — LIPID PANEL
Cholesterol: 149 mg/dL (ref 0–200)
HDL: 51.9 mg/dL (ref >39.00)
LDL Cholesterol: 73 mg/dL (ref 0–99)
NonHDL: 97.22
Total CHOL/HDL Ratio: 3
Triglycerides: 121 mg/dL (ref 0.0–149.0)
VLDL: 24.2 mg/dL (ref 0.0–40.0)

## 2020-03-05 NOTE — Progress Notes (Signed)
Subjective:    Patient ID: Stephen Wise, male    DOB: 1970/11/25, 49 y.o.   MRN: [id number]  HPI  Pt presents to the clinic today for his annual exam.   Flu: never Tetanus: 01/2017 Covid: 02/2020 Levan Hurst) Colon Screening: 07/2019 Vision Screening: annually Dentist: as needed  Diet: He does eat meat. He consumes some fruits and veggies. He does not eat a lot of fried foods. He drinks mostly soda, some water. Exercise: None  Review of Systems      Past Medical History:  Diagnosis Date  . Adult ADHD   . Chronic headaches   . Chronic pain   . Chronic prostatitis   . Condyloma acuminatum of penis   . Crohn disease ([place])    with terminal ileitis  . ED (erectile dysfunction)   . Gastric ulcer   . Inflammatory bowel disease   . Microcytic anemia   . Migraines 10/01/2019  . Peyronie's disease     Current Outpatient Medications  Medication Sig Dispense Refill  . amphetamine-dextroamphetamine (ADDERALL XR) 20 MG 24 hr capsule Take 1 capsule (20 mg total) by mouth every morning. 30 capsule 0  . butalbital-acetaminophen-caffeine (FIORICET WITH CODEINE) 50-325-40-30 MG capsule Take 1 capsule by mouth every 8 (eight) hours as needed for headache. 15 capsule 0  . ferrous sulfate 325 (65 FE) MG tablet Take 325 mg by mouth daily with breakfast.    . HYDROcodone-acetaminophen (NORCO/VICODIN) 5-325 MG tablet Take 1 tablet by mouth every 6 (six) hours as needed for moderate pain or severe pain. 90 tablet 0  . mesalamine (PENTASA) 500 MG CR capsule Take 1,000 mg by mouth 3 (three) times daily as needed.     . pantoprazole (PROTONIX) 40 MG tablet Take 40 mg by mouth daily.    . SUMAtriptan (IMITREX) 100 MG tablet May repeat in 2 hours if headache persists or recurs. 9 tablet 4   No current facility-administered medications for this visit.    No Known Allergies  Family History  Problem Relation Age of Onset  . Irritable bowel syndrome Mother   . Cirrhosis Father   . Alzheimer's  disease Maternal Grandmother     Social History   Socioeconomic History  . Marital status: Married    Spouse name: Dottie  . Number of children: 4  . Years of education: Not on file  . Highest education level: Some college, no degree  Occupational History  . Occupation: Warehouse  Tobacco Use  . Smoking status: Never Smoker  . Smokeless tobacco: Never Used  Substance and Sexual Activity  . Alcohol use: Yes    Comment: social  . Drug use: No  . Sexual activity: Yes  Other Topics Concern  . Not on file  Social History Narrative   Pt is right handed. He is married, lives in a one story house with a basement. He drinks 3 sodas a day. He is very active at work.   Social Determinants of Health   Financial Resource Strain:   . Difficulty of Paying Living Expenses:   Food Insecurity:   . Worried About Charity fundraiser in the Last Year:   . Arboriculturist in the Last Year:   Transportation Needs:   . Film/video editor (Medical):   Marland Kitchen Lack of Transportation (Non-Medical):   Physical Activity:   . Days of Exercise per Week:   . Minutes of Exercise per Session:   Stress:   . Feeling of Stress :  Social Connections:   . Frequency of Communication with Friends and Family:   . Frequency of Social Gatherings with Friends and Family:   . Attends Religious Services:   . Active Member of Clubs or Organizations:   . Attends Archivist Meetings:   Marland Kitchen Marital Status:   Intimate Partner Violence:   . Fear of Current or Ex-Partner:   . Emotionally Abused:   Marland Kitchen Physically Abused:   . Sexually Abused:      Constitutional: Pt reports intermittent headaches. Denies fever, malaise, fatigue, or abrupt weight changes.  HEENT: Denies eye pain, eye redness, ear pain, ringing in the ears, wax buildup, runny nose, nasal congestion, bloody nose, or sore throat. Respiratory: Denies difficulty breathing, shortness of breath, cough or sputum production.   Cardiovascular: Denies  chest pain, chest tightness, palpitations or swelling in the hands or feet.  Gastrointestinal: Pt reports intermittent diarrhea. Denies abdominal pain, bloating, constipation, or blood in the stool.  GU: Denies urgency, frequency, pain with urination, burning sensation, blood in urine, odor or discharge. Musculoskeletal: Pt reports intermittent joint pain. Denies decrease in range of motion, difficulty with gait, muscle pain or joint swelling.  Skin: Denies redness, rashes, lesions or ulcercations.  Neurological: Pt reports inattention. Denies dizziness, difficulty with memory, difficulty with speech or problems with balance and coordination.  Psych: Denies anxiety, depression, SI/HI.  No other specific complaints in a complete review of systems (except as listed in HPI above).  Objective:   Physical Exam  BP 138/84   Pulse 76   Temp 98.1 F (36.7 C) (Temporal)   Ht 5' 10.25" (1.784 m)   Wt 206 lb (93.4 kg)   SpO2 98%   BMI 29.35 kg/m  Wt Readings from Last 3 Encounters:  03/05/20 206 lb (93.4 kg)  09/05/19 205 lb (93 kg)  08/08/19 211 lb (95.7 kg)    General: Appears his stated age, well developed, well nourished in NAD. Skin: Warm, dry and intact. No rashes noted. HEENT: Head: normal shape and size; Eyes: sclera white, no icterus, conjunctiva pink, PERRLA and EOMs intact;  Neck:  Neck supple, trachea midline. No masses, lumps or thyromegaly present.  Cardiovascular: Normal rate and rhythm. S1,S2 noted.  No murmur, rubs or gallops noted. No JVD or BLE edema.  Pulmonary/Chest: Normal effort and positive vesicular breath sounds. No respiratory distress. No wheezes, rales or ronchi noted.  Abdomen: Soft and nontender. Normal bowel sounds. No distention or masses noted. Liver, spleen and kidneys non palpable. Musculoskeletal: Strength 5/5 BUE/BLE. No difficulty with gait.  Neurological: Alert and oriented. Cranial nerves II-XII grossly intact. Coordination normal.  Psychiatric: Mood  and affect normal. Behavior is normal. Judgment and thought content normal.    BMET    Component Value Date/Time   NA 138 08/08/2019 1624   K 5.0 08/08/2019 1624   CL 104 08/08/2019 1624   CO2 27 08/08/2019 1624   GLUCOSE 92 08/08/2019 1624   BUN 27 (H) 08/08/2019 1624   CREATININE 1.24 08/08/2019 1624   CALCIUM 9.2 08/08/2019 1624    Lipid Panel  No results found for: CHOL, TRIG, HDL, CHOLHDL, VLDL, LDLCALC  CBC    Component Value Date/Time   WBC 7.8 08/08/2019 1624   RBC 4.31 08/08/2019 1624   HGB 11.1 (L) 08/08/2019 1624   HCT 35.3 (L) 08/08/2019 1624   PLT 619.0 (H) 08/08/2019 1624   MCV 81.9 08/08/2019 1624   MCHC 31.4 08/08/2019 1624   RDW 20.8 (H) 08/08/2019 1624  LYMPHSABS 1.6 08/08/2019 1624   MONOABS 0.7 08/08/2019 1624   EOSABS 0.1 08/08/2019 1624   BASOSABS 0.1 08/08/2019 1624    Hgb A1C No results found for: HGBA1C          Assessment & Plan:   Preventative Health Maintenance:  Encouraged him to get a flu shot in the fall Tetanus UTD Colon screening UTD Encouraged him to consume a balanced diet and exercise regimen Advised him to see an eye doctor and dentist annually Will check CBC, CMET, Lipid profile today  RTC in 1 year, sooner if needed. (Ok to fill medications until 03/2021, although I did not do a follow up of chronic conditions) Webb Silversmith, NP This visit occurred during the SARS-CoV-2 public health emergency.  Safety protocols were in place, including screening questions prior to the visit, additional usage of staff PPE, and extensive cleaning of exam room while observing appropriate contact time as indicated for disinfecting solutions.

## 2020-03-05 NOTE — Patient Instructions (Signed)

## 2020-03-17 ENCOUNTER — Other Ambulatory Visit: Payer: Self-pay | Admitting: Internal Medicine

## 2020-03-18 ENCOUNTER — Encounter: Payer: Self-pay | Admitting: Internal Medicine

## 2020-03-20 MED ORDER — BUTALBITAL-APAP-CAFF-COD 50-325-40-30 MG PO CAPS: 1.0000 | ORAL_CAPSULE | Freq: Three times a day (TID) | ORAL | 0 refills | Status: DC | PRN

## 2020-03-24 ENCOUNTER — Telehealth: Payer: Self-pay | Admitting: *Deleted

## 2020-03-24 NOTE — Telephone Encounter (Signed)
LVM informing patient that no phone refills can be done on narcotics. Offered him to call for an earlier appointment next week.

## 2020-04-09 ENCOUNTER — Other Ambulatory Visit: Payer: Self-pay

## 2020-04-09 ENCOUNTER — Ambulatory Visit: Payer: Managed Care, Other (non HMO) | Attending: Anesthesiology | Admitting: Anesthesiology

## 2020-04-09 ENCOUNTER — Encounter: Payer: Self-pay | Admitting: Anesthesiology

## 2020-04-09 DIAGNOSIS — M5432 Sciatica, left side: Secondary | ICD-10-CM | POA: Diagnosis not present

## 2020-04-09 DIAGNOSIS — Z79891 Long term (current) use of opiate analgesic: Secondary | ICD-10-CM

## 2020-04-09 DIAGNOSIS — M4316 Spondylolisthesis, lumbar region: Secondary | ICD-10-CM

## 2020-04-09 DIAGNOSIS — M79641 Pain in right hand: Secondary | ICD-10-CM

## 2020-04-09 DIAGNOSIS — M542 Cervicalgia: Secondary | ICD-10-CM

## 2020-04-09 DIAGNOSIS — M47817 Spondylosis without myelopathy or radiculopathy, lumbosacral region: Secondary | ICD-10-CM | POA: Diagnosis not present

## 2020-04-09 DIAGNOSIS — G894 Chronic pain syndrome: Secondary | ICD-10-CM

## 2020-04-09 DIAGNOSIS — M79642 Pain in left hand: Secondary | ICD-10-CM

## 2020-04-09 DIAGNOSIS — G43709 Chronic migraine without aura, not intractable, without status migrainosus: Secondary | ICD-10-CM

## 2020-04-09 DIAGNOSIS — M47816 Spondylosis without myelopathy or radiculopathy, lumbar region: Secondary | ICD-10-CM

## 2020-04-09 MED ORDER — HYDROCODONE-ACETAMINOPHEN 5-325 MG PO TABS: 1.0000 | ORAL_TABLET | Freq: Four times a day (QID) | ORAL | 0 refills | Status: DC | PRN

## 2020-04-09 MED ORDER — HYDROCODONE-ACETAMINOPHEN 5-325 MG PO TABS: 1.0000 | ORAL_TABLET | Freq: Four times a day (QID) | ORAL | 0 refills | Status: AC | PRN

## 2020-04-09 NOTE — Progress Notes (Signed)
Virtual Visit via Telephone Note  I connected with Stephen Wise on 04/09/20 at  1:45 PM EDT by telephone and verified that I am speaking with the correct person using two identifiers.  Location: Patient: Home Provider: Pain control center   I discussed the limitations, risks, security and privacy concerns of performing an evaluation and management service by telephone and the availability of in person appointments. I also discussed with the patient that there may be a patient responsible charge related to this service. The patient expressed understanding and agreed to proceed.   History of Present Illness: I spoke with Stephen Wise today via telephone.  We were unable to do the video portion of the virtual conference.  Based on our discussion today reports that he is doing reasonably well with his low back pain.  The quality characteristic and distribution of this is been stable.  Is doing somewhat better since our last conversation.  His workload has diminished some which is helping.  He is taking his medications 3 times a day and these continue to give him good relief.  He continues to work on core stretching strengthening exercises without difficulty.  His strength is been stable.  No change in bowel or bladder function is noted.  His hand pain is also been better and he still getting some periodic cervical neck pain but no significant differences noted no problems with upper extremity strength are reported.  Otherwise he is getting good relief with the medications and no side effects are noted.   Observations/Objective:   Current Outpatient Medications:  .  amphetamine-dextroamphetamine (ADDERALL XR) 20 MG 24 hr capsule, Take 1 capsule (20 mg total) by mouth every morning., Disp: 30 capsule, Rfl: 0 .  butalbital-acetaminophen-caffeine (FIORICET WITH CODEINE) 50-325-40-30 MG capsule, Take 1 capsule by mouth every 8 (eight) hours as needed for headache., Disp: 15 capsule, Rfl: 0 .  ferrous sulfate  325 (65 FE) MG tablet, Take 325 mg by mouth daily with breakfast., Disp: , Rfl:  .  HYDROcodone-acetaminophen (NORCO/VICODIN) 5-325 MG tablet, Take 1 tablet by mouth every 6 (six) hours as needed for moderate pain or severe pain., Disp: 90 tablet, Rfl: 0 .  [START ON 05/09/2020] HYDROcodone-acetaminophen (NORCO/VICODIN) 5-325 MG tablet, Take 1 tablet by mouth every 6 (six) hours as needed for moderate pain or severe pain., Disp: 90 tablet, Rfl: 0 .  mesalamine (PENTASA) 500 MG CR capsule, Take 1,000 mg by mouth 3 (three) times daily as needed. , Disp: , Rfl:  .  pantoprazole (PROTONIX) 40 MG tablet, Take 40 mg by mouth daily., Disp: , Rfl:  .  SUMAtriptan (IMITREX) 100 MG tablet, May repeat in 2 hours if headache persists or recurs., Disp: 9 tablet, Rfl: 4 Assessment and Plan: 1. Lumbosacral spondylosis without myelopathy   2. Chronic pain syndrome   3. Long term current use of opiate analgesic   4. Sciatica of left side   5. Facet arthritis of lumbar region   6. Neck pain   7. Chronic migraine without aura without status migrainosus, not intractable   8. Spondylolisthesis of lumbar region   9. Pain in both hands   Based on our discussion today and upon review of the [facility] practitioner database we will refill his medicines for July 8 and August 7.  I want him to continue with his current medication regimen and pain management planned.  We will schedule him for return to clinic in 2 months.  I talked to him about avoidance of certain  overhead activity to help with some of the cervical neck pain.  He still getting occasional numbness in his little finger left side but no problems with strength are reported.  He is to continue follow-up with his primary care physicians for his baseline medical care   Follow Up Instructions:    I discussed the assessment and treatment plan with the patient. The patient was provided an opportunity to ask questions and all were answered. The patient agreed with  the plan and demonstrated an understanding of the instructions.   The patient was advised to call back or seek an in-person evaluation if the symptoms worsen or if the condition fails to improve as anticipated.  I provided 25 minutes of non-face-to-face time during this encounter.   Molli Barrows, MD

## 2020-04-20 LAB — TOXASSURE SELECT 13 (MW), URINE

## 2020-06-01 ENCOUNTER — Other Ambulatory Visit: Payer: Self-pay

## 2020-06-01 ENCOUNTER — Encounter: Payer: Self-pay | Admitting: Anesthesiology

## 2020-06-01 ENCOUNTER — Ambulatory Visit: Payer: Self-pay | Attending: Anesthesiology | Admitting: Anesthesiology

## 2020-06-01 DIAGNOSIS — M5432 Sciatica, left side: Secondary | ICD-10-CM

## 2020-06-01 DIAGNOSIS — M47817 Spondylosis without myelopathy or radiculopathy, lumbosacral region: Secondary | ICD-10-CM

## 2020-06-01 DIAGNOSIS — Z79891 Long term (current) use of opiate analgesic: Secondary | ICD-10-CM

## 2020-06-01 DIAGNOSIS — M542 Cervicalgia: Secondary | ICD-10-CM

## 2020-06-01 DIAGNOSIS — G43709 Chronic migraine without aura, not intractable, without status migrainosus: Secondary | ICD-10-CM

## 2020-06-01 DIAGNOSIS — G894 Chronic pain syndrome: Secondary | ICD-10-CM

## 2020-06-01 DIAGNOSIS — M4316 Spondylolisthesis, lumbar region: Secondary | ICD-10-CM

## 2020-06-01 DIAGNOSIS — M47816 Spondylosis without myelopathy or radiculopathy, lumbar region: Secondary | ICD-10-CM

## 2020-06-01 MED ORDER — HYDROCODONE-ACETAMINOPHEN 5-325 MG PO TABS: 1.0000 | ORAL_TABLET | Freq: Four times a day (QID) | ORAL | 0 refills | Status: AC | PRN

## 2020-06-01 NOTE — Progress Notes (Signed)
Virtual Visit via Telephone Note  I connected with Stephen Wise on 06/01/20 at  2:15 PM EDT by telephone and verified that I am speaking with the correct person using two identifiers.  Location: Patient: Home Provider: Pain control center   I discussed the limitations, risks, security and privacy concerns of performing an evaluation and management service by telephone and the availability of in person appointments. I also discussed with the patient that there may be a patient responsible charge related to this service. The patient expressed understanding and agreed to proceed.   History of Present Illness: I spoke with Stephen Wise today via telephone as he was unable to link up with me for the video conference.  The virtual conference revealed that he is having some persistent low back pain that has been worse with increased activity schedule.  Secondary to the stresses of work and some of the requirements he did discontinue his present job and is currently looking for new employment.  He mentions that he is taking his medications as prescribed and these are still working to keep his back pain and leg pain under control.  The quality characteristic and distribution of these pain complaints are stable in nature without any recent change.  No problems with lower extremity strength or functioning is noted and bowel bladder function has been good.  He is tolerating the medications without difficulty and per our conversation continues to derive good functional benefit from this recalcitrant pain syndrome he is experiencing.    Observations/Objective:  Current Outpatient Medications:  .  amphetamine-dextroamphetamine (ADDERALL XR) 20 MG 24 hr capsule, Take 1 capsule (20 mg total) by mouth every morning., Disp: 30 capsule, Rfl: 0 .  butalbital-acetaminophen-caffeine (FIORICET WITH CODEINE) 50-325-40-30 MG capsule, Take 1 capsule by mouth every 8 (eight) hours as needed for headache., Disp: 15 capsule, Rfl:  0 .  ferrous sulfate 325 (65 FE) MG tablet, Take 325 mg by mouth daily with breakfast., Disp: , Rfl:  .  [START ON 06/02/2020] HYDROcodone-acetaminophen (NORCO/VICODIN) 5-325 MG tablet, Take 1 tablet by mouth every 6 (six) hours as needed for moderate pain or severe pain., Disp: 100 tablet, Rfl: 0 .  [START ON 07/08/2020] HYDROcodone-acetaminophen (NORCO/VICODIN) 5-325 MG tablet, Take 1 tablet by mouth every 6 (six) hours as needed for moderate pain or severe pain., Disp: 90 tablet, Rfl: 0 .  mesalamine (PENTASA) 500 MG CR capsule, Take 1,000 mg by mouth 3 (three) times daily as needed. , Disp: , Rfl:  .  pantoprazole (PROTONIX) 40 MG tablet, Take 40 mg by mouth daily., Disp: , Rfl:  .  SUMAtriptan (IMITREX) 100 MG tablet, May repeat in 2 hours if headache persists or recurs., Disp: 9 tablet, Rfl: 4  Assessment and Plan:   Follow Up Instructions:    I discussed the assessment and treatment plan with the patient. The patient was provided an opportunity to ask questions and all were answered. The patient agreed with the plan and demonstrated an understanding of the instructions.   1. Lumbosacral spondylosis without myelopathy   2. Chronic pain syndrome   3. Long term current use of opiate analgesic   4. Sciatica of left side   5. Facet arthritis of lumbar region   6. Neck pain   7. Chronic migraine without aura without status migrainosus, not intractable   8. Spondylolisthesis of lumbar region   Based on our discussion today and upon review of the [facility] practitioner database information I think is appropriate to refill his  medications.  Unfortunately he has failed more conservative therapy and has required opioid management for prolonged period of time to help with his low back pain.  After review of the database and going to refill his medications for September 6 and October 6.  I have asked that he continue efforts at stretching and strengthening as previously discussed with him.  He  is to continue follow-up with his primary care physicians for his baseline medical care.  We will request a return to clinic for evaluation in 2 months.    The patient was advised to call back or seek an in-person evaluation if the symptoms worsen or if the condition fails to improve as anticipated.  I provided 30 minutes of non-face-to-face time during this encounter.   Molli Barrows, MD

## 2020-06-03 ENCOUNTER — Other Ambulatory Visit: Payer: Self-pay | Admitting: Internal Medicine

## 2020-06-05 MED ORDER — BUTALBITAL-APAP-CAFF-COD 50-325-40-30 MG PO CAPS
1.0000 | ORAL_CAPSULE | Freq: Three times a day (TID) | ORAL | 0 refills | Status: DC | PRN
Start: 2020-06-05 — End: 2020-08-13

## 2020-06-05 NOTE — Telephone Encounter (Signed)
Last filled 03/20/2020...Marland Kitchen please advise

## 2020-07-06 ENCOUNTER — Other Ambulatory Visit: Payer: Self-pay | Admitting: Anesthesiology

## 2020-07-21 ENCOUNTER — Other Ambulatory Visit: Payer: Self-pay | Admitting: Anesthesiology

## 2020-08-10 ENCOUNTER — Encounter: Payer: Self-pay | Admitting: Anesthesiology

## 2020-08-10 ENCOUNTER — Other Ambulatory Visit: Payer: Self-pay

## 2020-08-10 ENCOUNTER — Ambulatory Visit: Payer: Self-pay | Attending: Anesthesiology | Admitting: Anesthesiology

## 2020-08-10 DIAGNOSIS — M47816 Spondylosis without myelopathy or radiculopathy, lumbar region: Secondary | ICD-10-CM

## 2020-08-10 DIAGNOSIS — Z79891 Long term (current) use of opiate analgesic: Secondary | ICD-10-CM

## 2020-08-10 DIAGNOSIS — M542 Cervicalgia: Secondary | ICD-10-CM

## 2020-08-10 DIAGNOSIS — G894 Chronic pain syndrome: Secondary | ICD-10-CM

## 2020-08-10 DIAGNOSIS — M5137 Other intervertebral disc degeneration, lumbosacral region: Secondary | ICD-10-CM

## 2020-08-10 DIAGNOSIS — M5432 Sciatica, left side: Secondary | ICD-10-CM

## 2020-08-10 DIAGNOSIS — M4316 Spondylolisthesis, lumbar region: Secondary | ICD-10-CM

## 2020-08-10 DIAGNOSIS — M47817 Spondylosis without myelopathy or radiculopathy, lumbosacral region: Secondary | ICD-10-CM

## 2020-08-10 MED ORDER — HYDROCODONE-ACETAMINOPHEN 5-325 MG PO TABS
1.0000 | ORAL_TABLET | Freq: Three times a day (TID) | ORAL | 0 refills | Status: AC
Start: 2020-08-10 — End: 2020-09-09

## 2020-08-10 MED ORDER — HYDROCODONE-ACETAMINOPHEN 5-325 MG PO TABS: 1.0000 | ORAL_TABLET | Freq: Three times a day (TID) | ORAL | 0 refills | Status: DC

## 2020-08-10 MED ORDER — CYCLOBENZAPRINE HCL 10 MG PO TABS: 10.0000 mg | ORAL_TABLET | Freq: Two times a day (BID) | ORAL | 1 refills | Status: AC | PRN

## 2020-08-10 NOTE — Progress Notes (Signed)
Virtual Visit via Video Note  I connected with Stephen Wise on 08/10/20 at  2:00 PM EST by a video enabled telemedicine application and verified that I am speaking with the correct person using two identifiers.  Location: Patient: Home Provider: Pain control center   I discussed the limitations of evaluation and management by telemedicine and the availability of in person appointments. The patient expressed understanding and agreed to proceed.  History of Present Illness: I spoke with Stephen Wise today via video for the virtual conference.  Unfortunately he recently had to move to [facility] and has picked up some additional work at [employer] and ended up injuring his back while lifting something there.  The pain he is describing presently is primarily lumbar situated with a spasming nature nonretracting and persistent.  He has been placing ice on this 2-3 times a day try some gradual stretches.  He mentions that he can now go from seated to standing slowly but this has been a problem.  No changes in bowel or bladder function or lower extremity strength are noted.  He is not getting much of a sciatica type presentation.  He is taking his hydrocodone 2-3 times a day as prescribed and these do help.  He is not taking muscle relaxant presently although he has done well with Flexeril in the past.  He is try some stretching exercises to help.  No other problems are noted.    Observations/Objective:  Current Outpatient Medications:  .  amphetamine-dextroamphetamine (ADDERALL XR) 20 MG 24 hr capsule, Take 1 capsule (20 mg total) by mouth every morning., Disp: 30 capsule, Rfl: 0 .  butalbital-acetaminophen-caffeine (FIORICET WITH CODEINE) 50-325-40-30 MG capsule, Take 1 capsule by mouth every 8 (eight) hours as needed for headache., Disp: 15 capsule, Rfl: 0 .  cyclobenzaprine (FLEXERIL) 10 MG tablet, Take 1 tablet (10 mg total) by mouth 2 (two) times daily as needed for muscle spasms., Disp: 60 tablet, Rfl: 1 .   ferrous sulfate 325 (65 FE) MG tablet, Take 325 mg by mouth daily with breakfast., Disp: , Rfl:  .  HYDROcodone-acetaminophen (NORCO/VICODIN) 5-325 MG tablet, Take 1 tablet by mouth 3 (three) times daily., Disp: 90 tablet, Rfl: 0 .  [START ON 09/08/2020] HYDROcodone-acetaminophen (NORCO/VICODIN) 5-325 MG tablet, Take 1 tablet by mouth 3 (three) times daily., Disp: 90 tablet, Rfl: 0 .  mesalamine (PENTASA) 500 MG CR capsule, Take 1,000 mg by mouth 3 (three) times daily as needed. , Disp: , Rfl:  .  pantoprazole (PROTONIX) 40 MG tablet, Take 40 mg by mouth daily., Disp: , Rfl:  .  SUMAtriptan (IMITREX) 100 MG tablet, May repeat in 2 hours if headache persists or recurs., Disp: 9 tablet, Rfl: 4  Assessment and Plan:  1. Lumbosacral spondylosis without myelopathy   2. Chronic pain syndrome   3. Long term current use of opiate analgesic   4. Sciatica of left side   5. Facet arthritis of lumbar region   6. Neck pain   7. Spondylolisthesis of lumbar region   8. Disc disease, degenerative, lumbar or lumbosacral   Based on his description today and when to start him on Flexeril to be taken twice a day as needed to help with some of the muscle spasming's.  I have talked about potential risk and side effect protocol with these combination drugs and the hydrocodone with potential for constipation as well.  He understands this and will contact us should he have any problems with his medication regimen.  In the  meantime we have talked about some stretching strengthening exercises to initiate.  Should this be ineffective he is instructed to contact us for possible trigger point or epidural management as needed.  I have reviewed the [facility] practitioner database information and it is appropriate for refills.  These will be dated for November 8 and December 7.  I will schedule him for a virtual return to clinic in 2 months.  He is to continue follow-up with his primary care physicians for his baseline medical  care and present to the ER should he have any worsening side effects with his low back. Follow Up Instructions:    I discussed the assessment and treatment plan with the patient. The patient was provided an opportunity to ask questions and all were answered. The patient agreed with the plan and demonstrated an understanding of the instructions.   The patient was advised to call back or seek an in-person evaluation if the symptoms worsen or if the condition fails to improve as anticipated.  I provided 30 minutes of non-face-to-face time during this encounter.   Molli Barrows, MD

## 2020-08-13 ENCOUNTER — Other Ambulatory Visit: Payer: Self-pay | Admitting: Internal Medicine

## 2020-08-13 MED ORDER — BUTALBITAL-APAP-CAFF-COD 50-325-40-30 MG PO CAPS
1.0000 | ORAL_CAPSULE | Freq: Three times a day (TID) | ORAL | 0 refills | Status: DC | PRN
Start: 2020-08-13 — End: 2020-09-22

## 2020-08-13 NOTE — Telephone Encounter (Signed)
Will refill this time, but needs to find a new PCP in his area.

## 2020-08-13 NOTE — Telephone Encounter (Signed)
Pharmacy requests refill on: butalbital-acetaminophen-caffeine 50-325-40-30 mg   LAST REFILL: 06/03/2020 LAST OV: 03/05/2020 NEXT OV: Not Scheduled  PHARMACY: [facility] [address], [place]

## 2020-08-26 ENCOUNTER — Other Ambulatory Visit: Payer: Self-pay | Admitting: Anesthesiology

## 2020-09-22 ENCOUNTER — Other Ambulatory Visit: Payer: Self-pay | Admitting: Internal Medicine

## 2020-09-23 MED ORDER — BUTALBITAL-APAP-CAFF-COD 50-325-40-30 MG PO CAPS
1.0000 | ORAL_CAPSULE | Freq: Three times a day (TID) | ORAL | 0 refills | Status: DC | PRN
Start: 2020-09-23 — End: 2020-12-04

## 2020-09-23 NOTE — Telephone Encounter (Signed)
;  ast filled 08/13/2020...Marland Kitchen please advise

## 2020-09-23 NOTE — Telephone Encounter (Signed)
Last refill

## 2020-10-01 ENCOUNTER — Other Ambulatory Visit: Payer: Self-pay | Admitting: Anesthesiology

## 2020-10-06 ENCOUNTER — Ambulatory Visit: Payer: Self-pay | Attending: Anesthesiology | Admitting: Anesthesiology

## 2020-10-06 ENCOUNTER — Other Ambulatory Visit: Payer: Self-pay

## 2020-10-06 DIAGNOSIS — M4316 Spondylolisthesis, lumbar region: Secondary | ICD-10-CM

## 2020-10-06 DIAGNOSIS — M5432 Sciatica, left side: Secondary | ICD-10-CM

## 2020-10-06 DIAGNOSIS — Z79891 Long term (current) use of opiate analgesic: Secondary | ICD-10-CM

## 2020-10-06 DIAGNOSIS — M47816 Spondylosis without myelopathy or radiculopathy, lumbar region: Secondary | ICD-10-CM

## 2020-10-06 DIAGNOSIS — M5137 Other intervertebral disc degeneration, lumbosacral region: Secondary | ICD-10-CM

## 2020-10-06 DIAGNOSIS — M542 Cervicalgia: Secondary | ICD-10-CM

## 2020-10-06 DIAGNOSIS — M47817 Spondylosis without myelopathy or radiculopathy, lumbosacral region: Secondary | ICD-10-CM

## 2020-10-06 DIAGNOSIS — G894 Chronic pain syndrome: Secondary | ICD-10-CM

## 2020-10-06 MED ORDER — HYDROCODONE-ACETAMINOPHEN 5-325 MG PO TABS: 1.0000 | ORAL_TABLET | Freq: Three times a day (TID) | ORAL | 0 refills | Status: DC

## 2020-10-06 MED ORDER — HYDROCODONE-ACETAMINOPHEN 5-325 MG PO TABS: 1.0000 | ORAL_TABLET | Freq: Four times a day (QID) | ORAL | 0 refills | Status: DC | PRN

## 2020-10-07 ENCOUNTER — Other Ambulatory Visit: Payer: Self-pay | Admitting: Anesthesiology

## 2020-10-08 ENCOUNTER — Telehealth: Payer: Self-pay

## 2020-10-08 MED ORDER — HYDROCODONE-ACETAMINOPHEN 5-325 MG PO TABS: 1.0000 | ORAL_TABLET | Freq: Three times a day (TID) | ORAL | 0 refills | Status: AC

## 2020-10-08 NOTE — Telephone Encounter (Signed)
I have sent a bubble message to Dr. Andree Elk asking him to do this.

## 2020-10-08 NOTE — Telephone Encounter (Signed)
He wants his prescriptions, this month and next months, transferred to [facility] because they are going to be there for a while.  [facility] [place]

## 2020-10-08 NOTE — Progress Notes (Signed)
Virtual Visit via Telephone Note  I connected with Stephen Wise on 10/08/20 at  2:00 PM EST by telephone and verified that I am speaking with the correct person using two identifiers.  Location: Patient: Home Provider: Pain control center   I discussed the limitations, risks, security and privacy concerns of performing an evaluation and management service by telephone and the availability of in person appointments. I also discussed with the patient that there may be a patient responsible charge related to this service. The patient expressed understanding and agreed to proceed.   History of Present Illness: I spoke with Stephen Wise for his virtual conference today.  He reports that he is doing reasonably well with his low back pain.  He still having some breakthrough pain but the medications work well for him.  Despite stretching strengthening exercises this pain persists and really nothing else has helped other than the medication management.  He is still very active with his current job.  He does a lot of lifting and this does exacerbate his pain.  The quality characteristic distribution of the pain of been stable and his medications continue to give him good relief and enable him to continue working.  No side effects reported.  No diverting or illicit use is noted.  Has bowel bladder function and strength has been stable as well.   Observations/Objective:  Current Outpatient Medications:  .  [START ON 11/08/2020] HYDROcodone-acetaminophen (NORCO/VICODIN) 5-325 MG tablet, Take 1 tablet by mouth every 6 (six) hours as needed for moderate pain or severe pain., Disp: 90 tablet, Rfl: 0 .  amphetamine-dextroamphetamine (ADDERALL XR) 20 MG 24 hr capsule, Take 1 capsule (20 mg total) by mouth every morning., Disp: 30 capsule, Rfl: 0 .  butalbital-acetaminophen-caffeine (FIORICET WITH CODEINE) 50-325-40-30 MG capsule, Take 1 capsule by mouth every 8 (eight) hours as needed for headache., Disp: 15 capsule, Rfl:  0 .  ferrous sulfate 325 (65 FE) MG tablet, Take 325 mg by mouth daily with breakfast., Disp: , Rfl:  .  [START ON 10/09/2020] HYDROcodone-acetaminophen (NORCO/VICODIN) 5-325 MG tablet, Take 1 tablet by mouth 3 (three) times daily., Disp: 90 tablet, Rfl: 0 .  mesalamine (PENTASA) 500 MG CR capsule, Take 1,000 mg by mouth 3 (three) times daily as needed. , Disp: , Rfl:  .  pantoprazole (PROTONIX) 40 MG tablet, Take 40 mg by mouth daily., Disp: , Rfl:  .  SUMAtriptan (IMITREX) 100 MG tablet, May repeat in 2 hours if headache persists or recurs., Disp: 9 tablet, Rfl: 4  Assessment and Plan: 1. Lumbosacral spondylosis without myelopathy   2. Chronic pain syndrome   3. Long term current use of opiate analgesic   4. Sciatica of left side   5. Facet arthritis of lumbar region   6. Neck pain   7. Spondylolisthesis of lumbar region   8. Disc disease, degenerative, lumbar or lumbosacral   Based on discussion today and upon review of the [place] practitioner database information going to refill his medications for the next 2 months dated for January 7 in February 6.  This will be for 3 times daily dosing on his hydrocodone.  I have encouraged him to continue stretching strengthening exercises.  We have reviewed exercises that might help with his strength management and core strength.  I will schedule him for 41-monthreturn to clinic.  No interventional treatment will be initiated.  I encouraged him to follow-up with his primary care physicians for his baseline medical care.  Follow Up Instructions:  I discussed the assessment and treatment plan with the patient. The patient was provided an opportunity to ask questions and all were answered. The patient agreed with the plan and demonstrated an understanding of the instructions.   The patient was advised to call back or seek an in-person evaluation if the symptoms worsen or if the condition fails to improve as anticipated.  I provided 30 minutes  of non-face-to-face time during this encounter.   Molli Barrows, MD

## 2020-10-12 NOTE — Telephone Encounter (Signed)
Is this something you will do.

## 2020-11-04 ENCOUNTER — Other Ambulatory Visit: Payer: Self-pay | Admitting: Internal Medicine

## 2020-11-09 ENCOUNTER — Telehealth: Payer: Self-pay

## 2020-11-09 NOTE — Telephone Encounter (Signed)
Pt needs Rx sent from [address] [place], [address] to [place] [address] so that he is able to pick them up

## 2020-11-09 NOTE — Telephone Encounter (Signed)
Patient called and informed NO transferring of schedule 2 across state lines or to other pharmacies. Verbalizes understanding.

## 2020-12-04 ENCOUNTER — Other Ambulatory Visit: Payer: Self-pay

## 2020-12-04 ENCOUNTER — Encounter: Payer: Self-pay | Admitting: Anesthesiology

## 2020-12-04 ENCOUNTER — Ambulatory Visit: Payer: Self-pay | Attending: Anesthesiology | Admitting: Anesthesiology

## 2020-12-04 DIAGNOSIS — M47817 Spondylosis without myelopathy or radiculopathy, lumbosacral region: Secondary | ICD-10-CM

## 2020-12-04 DIAGNOSIS — M545 Low back pain, unspecified: Secondary | ICD-10-CM

## 2020-12-04 DIAGNOSIS — M47816 Spondylosis without myelopathy or radiculopathy, lumbar region: Secondary | ICD-10-CM

## 2020-12-04 DIAGNOSIS — M5137 Other intervertebral disc degeneration, lumbosacral region: Secondary | ICD-10-CM

## 2020-12-04 DIAGNOSIS — G894 Chronic pain syndrome: Secondary | ICD-10-CM

## 2020-12-04 DIAGNOSIS — M542 Cervicalgia: Secondary | ICD-10-CM

## 2020-12-04 DIAGNOSIS — G43709 Chronic migraine without aura, not intractable, without status migrainosus: Secondary | ICD-10-CM

## 2020-12-04 DIAGNOSIS — G8929 Other chronic pain: Secondary | ICD-10-CM

## 2020-12-04 DIAGNOSIS — M5432 Sciatica, left side: Secondary | ICD-10-CM

## 2020-12-04 DIAGNOSIS — Z79891 Long term (current) use of opiate analgesic: Secondary | ICD-10-CM

## 2020-12-04 DIAGNOSIS — M4316 Spondylolisthesis, lumbar region: Secondary | ICD-10-CM

## 2020-12-04 MED ORDER — HYDROCODONE-ACETAMINOPHEN 5-325 MG PO TABS: 1.0000 | ORAL_TABLET | Freq: Four times a day (QID) | ORAL | 0 refills | Status: DC | PRN

## 2020-12-04 MED ORDER — BUTALBITAL-APAP-CAFF-COD 50-325-40-30 MG PO CAPS
1.0000 | ORAL_CAPSULE | Freq: Three times a day (TID) | ORAL | 1 refills | Status: AC | PRN
Start: 2020-12-04 — End: 2021-01-03

## 2020-12-04 NOTE — Progress Notes (Signed)
Virtual Visit via Telephone Note  I connected with Stephen Wise on 12/04/20 at  2:10 PM EST by telephone and verified that I am speaking with the correct person using two identifiers.  Location: Patient: Home Provider: Pain control center   I discussed the limitations, risks, security and privacy concerns of performing an evaluation and management service by telephone and the availability of in person appointments. I also discussed with the patient that there may be a patient responsible charge related to this service. The patient expressed understanding and agreed to proceed.   History of Present Illness: I spoke with Stephen Wise today via telephone as he was unable to link for the video portion of the virtual conference but he reports that he has been doing reasonably well with his low back.  He is taking his medications as prescribed and these continue to work well for him.  He generally gets approximately 75 to 80% relief lasting about 4 to 6 hours before he gets recurrence of his baseline pain.  He is on a 3 times daily dosing regimen and feels that this is working well for him no side effects are reported.  No other untoward effects are noted with the medication.  The quality of the pain and characteristic is stable in nature.  He is doing stretching exercises to help.  He is traveling a lot with work back and forth from [employer] and he is getting some assistance from the administrative side to help consolidate some of the work for him.  In the meantime he is still having frequent migraines and has been on Fioricet for this.  He generally uses 1 to 2 tablets on a daily basis to help with migraines and it appears from the physician database record that he is getting approximately 45 tablets on a monthly basis.  He is currently looking for a neurologist to continue to follow-up for him but he is having difficulty getting in to see when he reports.  The quality of his migraines have been stable in  nature with no crescendo patterns or changes in the nature or characteristic of the headaches.  Otherwise he is in his usual state of health at this time.   Observations/Objective:  Current Outpatient Medications:  .  [START ON 01/10/2021] HYDROcodone-acetaminophen (NORCO/VICODIN) 5-325 MG tablet, Take 1 tablet by mouth every 6 (six) hours as needed for moderate pain or severe pain., Disp: 90 tablet, Rfl: 0 .  amphetamine-dextroamphetamine (ADDERALL XR) 20 MG 24 hr capsule, Take 1 capsule (20 mg total) by mouth every morning., Disp: 30 capsule, Rfl: 0 .  butalbital-acetaminophen-caffeine (FIORICET WITH CODEINE) 50-325-40-30 MG capsule, Take 1 capsule by mouth every 8 (eight) hours as needed for headache., Disp: 30 capsule, Rfl: 1 .  ferrous sulfate 325 (65 FE) MG tablet, Take 325 mg by mouth daily with breakfast., Disp: , Rfl:  .  [START ON 12/09/2020] HYDROcodone-acetaminophen (NORCO/VICODIN) 5-325 MG tablet, Take 1 tablet by mouth every 6 (six) hours as needed for moderate pain or severe pain., Disp: 90 tablet, Rfl: 0 .  mesalamine (PENTASA) 500 MG CR capsule, Take 1,000 mg by mouth 3 (three) times daily as needed. , Disp: , Rfl:  .  pantoprazole (PROTONIX) 40 MG tablet, Take 40 mg by mouth daily., Disp: , Rfl:  .  SUMAtriptan (IMITREX) 100 MG tablet, May repeat in 2 hours if headache persists or recurs., Disp: 9 tablet, Rfl: 4  Assessment and Plan:  1. Lumbosacral spondylosis without myelopathy   2.  Chronic pain syndrome   3. Long term current use of opiate analgesic   4. Sciatica of left side   5. Facet arthritis of lumbar region   6. Neck pain   7. Spondylolisthesis of lumbar region   8. Disc disease, degenerative, lumbar or lumbosacral   9. Chronic left-sided low back pain without sciatica   10. Chronic migraine without aura without status migrainosus, not intractable   Based on our discussion today and after review of the [facility] practitioner database information I am going to  refill his medicines.  This be dated from March 9 in April 10 for his hydrocodone.  This regimen seems to be working well for him and unfortunately he has failed more conservative therapy but this combination is keeping him active and allowing him to continue to work.  He is having some pain while sleeping at night in the back but this is been also generally well controlled.  He is currently trying to find a new neurologist to help with his migraine headache management.  Fortunately the headaches have been stable in nature and he has responded favorably to Fioricet without side effect.  In order to accommodate him under his current circumstances I am going to write for a prescription today with 1 refill and I requested that he take the next 2 to 3 months to get in with a neurologist to assist with this going forward.  He is to continue follow-up with his primary care physicians for his baseline medical care with a schedule return in approximately 2 months.  No other changes are made in his pain management protocol today. Follow Up Instructions:    I discussed the assessment and treatment plan with the patient. The patient was provided an opportunity to ask questions and all were answered. The patient agreed with the plan and demonstrated an understanding of the instructions.   The patient was advised to call back or seek an in-person evaluation if the symptoms worsen or if the condition fails to improve as anticipated.  I provided 30 minutes of non-face-to-face time during this encounter.   Molli Barrows, MD

## 2020-12-07 ENCOUNTER — Other Ambulatory Visit: Payer: Self-pay | Admitting: Anesthesiology

## 2020-12-09 ENCOUNTER — Telehealth: Payer: Self-pay

## 2020-12-09 NOTE — Telephone Encounter (Signed)
Pt would like his Rx for hydrocodone to be sent to another Pharm. His usual pharmacy said the med is on back order.  Please send send to [facility] here in [place].

## 2020-12-09 NOTE — Telephone Encounter (Signed)
He called back and said all the [facility]. [facility] it.

## 2020-12-10 ENCOUNTER — Encounter: Payer: Self-pay | Admitting: Anesthesiology

## 2020-12-10 ENCOUNTER — Other Ambulatory Visit: Payer: Self-pay | Admitting: Anesthesiology

## 2020-12-11 ENCOUNTER — Telehealth: Payer: Self-pay

## 2020-12-11 NOTE — Telephone Encounter (Signed)
Called patient to let him know that A prescription will be sent in to [facility] in [place] and that it would probably be this evening before this is done. [facility]. [facility]since [facility] will be sending at [facility]

## 2020-12-14 ENCOUNTER — Other Ambulatory Visit: Payer: Self-pay | Admitting: Anesthesiology

## 2020-12-14 ENCOUNTER — Telehealth: Payer: Self-pay

## 2020-12-14 ENCOUNTER — Other Ambulatory Visit: Payer: Self-pay | Admitting: *Deleted

## 2020-12-14 MED ORDER — HYDROCODONE-ACETAMINOPHEN 5-325 MG PO TABS: 1.0000 | ORAL_TABLET | Freq: Four times a day (QID) | ORAL | 0 refills | Status: DC | PRN

## 2020-12-14 MED ORDER — HYDROCODONE-ACETAMINOPHEN 5-325 MG PO TABS
1.0000 | ORAL_TABLET | Freq: Four times a day (QID) | ORAL | 0 refills | Status: AC | PRN
Start: 2020-12-14 — End: 2021-01-13

## 2020-12-14 NOTE — Telephone Encounter (Signed)
Spoke with patient and he states that all [facility] are out of hydrocodone.  He has asked that his March fill be transferred to [facility]. Told him I would send a message to DR Andree Elk.

## 2020-12-14 NOTE — Telephone Encounter (Signed)
Pt states [facility], Please give him a call once this is done.

## 2020-12-14 NOTE — Addendum Note (Signed)
Addended by: Molli Barrows on: 12/14/2020 03:36 PM   Modules accepted: Orders

## 2021-01-14 ENCOUNTER — Telehealth: Payer: Self-pay | Admitting: Anesthesiology

## 2021-01-14 NOTE — Telephone Encounter (Signed)
Asking for Fioricet. He has  plenty of Hydrocodone, but would like to have Fioricet on hand if needed.  Unable to send bubble message to Dr. Andree Elk. Will try again tomorrow. Patient notified.

## 2021-01-14 NOTE — Telephone Encounter (Signed)
Patient is asking if Dr. Andree Elk can call in another 2wk supply of the headache meds he gave him last visit. Says he took them in place of his reg meds when there was a problem at the pharmacy getting the reg meds. Please call patient and advise.

## 2021-01-18 MED ORDER — BUTALBITAL-APAP-CAFFEINE 50-325-40 MG PO TABS: 1.0000 | ORAL_TABLET | Freq: Two times a day (BID) | ORAL | 0 refills | Status: AC | PRN

## 2021-01-18 NOTE — Addendum Note (Signed)
Addended by: Molli Barrows on: 01/18/2021 09:28 AM   Modules accepted: Orders

## 2021-01-18 NOTE — Telephone Encounter (Signed)
Patient notified per voicemail that script for Fiorcet has been sent to pharmacy.

## 2021-01-21 ENCOUNTER — Encounter: Payer: Self-pay | Admitting: Anesthesiology

## 2021-02-01 ENCOUNTER — Ambulatory Visit: Payer: Self-pay | Attending: Anesthesiology | Admitting: Anesthesiology

## 2021-02-01 ENCOUNTER — Other Ambulatory Visit: Payer: Self-pay

## 2021-02-01 ENCOUNTER — Encounter: Payer: Self-pay | Admitting: Anesthesiology

## 2021-02-01 DIAGNOSIS — G8929 Other chronic pain: Secondary | ICD-10-CM

## 2021-02-01 DIAGNOSIS — Z79891 Long term (current) use of opiate analgesic: Secondary | ICD-10-CM

## 2021-02-01 DIAGNOSIS — M542 Cervicalgia: Secondary | ICD-10-CM

## 2021-02-01 DIAGNOSIS — G43709 Chronic migraine without aura, not intractable, without status migrainosus: Secondary | ICD-10-CM

## 2021-02-01 DIAGNOSIS — M47817 Spondylosis without myelopathy or radiculopathy, lumbosacral region: Secondary | ICD-10-CM

## 2021-02-01 DIAGNOSIS — M4316 Spondylolisthesis, lumbar region: Secondary | ICD-10-CM

## 2021-02-01 DIAGNOSIS — M545 Low back pain, unspecified: Secondary | ICD-10-CM

## 2021-02-01 DIAGNOSIS — M47816 Spondylosis without myelopathy or radiculopathy, lumbar region: Secondary | ICD-10-CM

## 2021-02-01 DIAGNOSIS — M5432 Sciatica, left side: Secondary | ICD-10-CM

## 2021-02-01 DIAGNOSIS — G894 Chronic pain syndrome: Secondary | ICD-10-CM

## 2021-02-01 DIAGNOSIS — M5137 Other intervertebral disc degeneration, lumbosacral region: Secondary | ICD-10-CM

## 2021-02-01 MED ORDER — HYDROCODONE-ACETAMINOPHEN 5-325 MG PO TABS: 1.0000 | ORAL_TABLET | Freq: Four times a day (QID) | ORAL | 0 refills | Status: AC | PRN

## 2021-02-01 MED ORDER — HYDROCODONE-ACETAMINOPHEN 5-325 MG PO TABS: 1.0000 | ORAL_TABLET | Freq: Four times a day (QID) | ORAL | 0 refills | Status: DC | PRN

## 2021-02-01 MED ORDER — BUTALBITAL-APAP-CAFF-COD 50-325-40-30 MG PO CAPS: 1.0000 | ORAL_CAPSULE | Freq: Every day | ORAL | 0 refills | Status: AC | PRN

## 2021-02-01 NOTE — Progress Notes (Signed)
Virtual Visit via Telephone Note  I connected with Stephen Wise on 02/01/21 at  1:40 PM EDT by telephone and verified that I am speaking with the correct person using two identifiers.  Location: Patient: Home Provider: Pain control center   I discussed the limitations, risks, security and privacy concerns of performing an evaluation and management service by telephone and the availability of in person appointments. I also discussed with the patient that there may be a patient responsible charge related to this service. The patient expressed understanding and agreed to proceed.   History of Present Illness: I was able to reach Stephen Wise for Stephen Wise.  He was unable to do the video portion of this but was able to do length via telephone.  He reports that his back pain and leg pain have been stable in nature.  It has been problematic with his recent travel and work related travel.  He does continue to get some spasming in the low back with radiation into the legs.  Quality characteristic and distribution of this has been stable with no recent changes reported.  He still taking his hydrocodone 3 times a day and this continues to work well for him.  No side effects reported and he generally gets 75% improvement lasting about 4 to 6 hours when he takes it.  He has failed more conservative therapy and anti-inflammatories have been ineffective.  He still doing his stretching strengthening exercises as well.  He has had problems with chronic headaches since been followed by his other physicians.  He is currently transitioning care and with the travel its been more difficult for him to follow-up with Dr. Dema Severin.  He reports that the quality of his headaches is unchanged but he tends to have tension and migraine headaches periodically.  No change in the quality or characteristic of these headaches is noted.  He generally takes butalbital with codeine and this aborts the headaches.  He uses this  approximately once every other day or so.   Observations/Objective:  Current Outpatient Medications:  .  butalbital-acetaminophen-caffeine (FIORICET WITH CODEINE) 50-325-40-30 MG capsule, Take 1 capsule by mouth daily as needed for headache., Disp: 30 capsule, Rfl: 0 .  [START ON 03/13/2021] HYDROcodone-acetaminophen (NORCO/VICODIN) 5-325 MG tablet, Take 1 tablet by mouth every 6 (six) hours as needed for moderate pain or severe pain., Disp: 90 tablet, Rfl: 0 .  amphetamine-dextroamphetamine (ADDERALL XR) 20 MG 24 hr capsule, Take 1 capsule (20 mg total) by mouth every morning., Disp: 30 capsule, Rfl: 0 .  butalbital-acetaminophen-caffeine (FIORICET) 50-325-40 MG tablet, Take 1 tablet by mouth 2 (two) times daily as needed for headache., Disp: 15 tablet, Rfl: 0 .  ferrous sulfate 325 (65 FE) MG tablet, Take 325 mg by mouth daily with breakfast., Disp: , Rfl:  .  [START ON 02/12/2021] HYDROcodone-acetaminophen (NORCO/VICODIN) 5-325 MG tablet, Take 1 tablet by mouth every 6 (six) hours as needed for moderate pain or severe pain., Disp: 90 tablet, Rfl: 0 .  mesalamine (PENTASA) 500 MG CR capsule, Take 1,000 mg by mouth 3 (three) times daily as needed. , Disp: , Rfl:  .  pantoprazole (PROTONIX) 40 MG tablet, Take 40 mg by mouth daily., Disp: , Rfl:  .  SUMAtriptan (IMITREX) 100 MG tablet, May repeat in 2 hours if headache persists or recurs., Disp: 9 tablet, Rfl: 4  Assessment and Plan: 1. Lumbosacral spondylosis without myelopathy   2. Chronic pain syndrome   3. Long term current use of opiate analgesic  4. Sciatica of left side   5. Facet arthritis of lumbar region   6. Neck pain   7. Spondylolisthesis of lumbar region   8. Disc disease, degenerative, lumbar or lumbosacral   9. Chronic left-sided low back pain without sciatica   10. Chronic migraine without aura without status migrainosus, not intractable   Based on our discussion today and upon review of the [place] practitioner  database information going to refill his medicines for May 13 and June 12.  This will be used for his hydrocodone.  This continues to work well for him and keeps him active and enables him to continue working.  We talked about some stretches to help with his back and facet issues and core strengthening exercises.  Additionally, he has asked [place] to assist with his butalbital regimen.  He is currently scheduled to follow-up with Dr. Dema Severin in a month.  I will assist with this months prescription on his butalbital with codeine and transfer this care to Dr. Dema Severin for future management as discussed with Stephen Wise today.  He has been stable with the butalbital with no evidence of any diverting or illicit use and continues to get good relief when he takes this for his headaches.  As such, we will schedule him for a 31-monthreturn for routine follow-up.  He is scheduled for routine UDS as well which she will need to complete within the next month.  Follow Up Instructions:    I discussed the assessment and treatment plan with the patient. The patient was provided an opportunity to ask questions and all were answered. The patient agreed with the plan and demonstrated an understanding of the instructions.   The patient was advised to call back or seek an in-person evaluation if the symptoms worsen or if the condition fails to improve as anticipated.  I provided 30 minutes of non-face-to-face time during this encounter.   Stephen Barrows MD

## 2021-03-04 ENCOUNTER — Telehealth: Payer: Self-pay | Admitting: Anesthesiology

## 2021-03-04 NOTE — Telephone Encounter (Signed)
Dr Andree Elk states that the patient needed to get the Fioricet with codeine from his PCP. Patient notified.

## 2021-03-04 NOTE — Telephone Encounter (Signed)
Bubble message sent to Dr Andree Elk

## 2021-03-04 NOTE — Telephone Encounter (Signed)
Patient's wife is calling asking if Dr. Andree Elk will call in [place] ? For headaches. Patient lost his brother in motorcycle accident recently and is really having a hard time dealing with.  Patient still does not have PCP but does have insurance now and is looking for pcp.

## 2021-04-02 ENCOUNTER — Other Ambulatory Visit: Payer: Self-pay | Admitting: Anesthesiology

## 2021-04-02 ENCOUNTER — Ambulatory Visit: Payer: Self-pay | Attending: Anesthesiology | Admitting: Anesthesiology

## 2021-04-02 ENCOUNTER — Other Ambulatory Visit: Payer: Self-pay

## 2021-04-02 DIAGNOSIS — M5432 Sciatica, left side: Secondary | ICD-10-CM

## 2021-04-02 DIAGNOSIS — Z79891 Long term (current) use of opiate analgesic: Secondary | ICD-10-CM

## 2021-04-02 DIAGNOSIS — M47816 Spondylosis without myelopathy or radiculopathy, lumbar region: Secondary | ICD-10-CM

## 2021-04-02 DIAGNOSIS — M542 Cervicalgia: Secondary | ICD-10-CM

## 2021-04-02 DIAGNOSIS — M5137 Other intervertebral disc degeneration, lumbosacral region: Secondary | ICD-10-CM

## 2021-04-02 DIAGNOSIS — M51379 Other intervertebral disc degeneration, lumbosacral region without mention of lumbar back pain or lower extremity pain: Secondary | ICD-10-CM

## 2021-04-02 DIAGNOSIS — M4316 Spondylolisthesis, lumbar region: Secondary | ICD-10-CM

## 2021-04-02 DIAGNOSIS — M47817 Spondylosis without myelopathy or radiculopathy, lumbosacral region: Secondary | ICD-10-CM

## 2021-04-02 DIAGNOSIS — G894 Chronic pain syndrome: Secondary | ICD-10-CM

## 2021-04-02 DIAGNOSIS — G43709 Chronic migraine without aura, not intractable, without status migrainosus: Secondary | ICD-10-CM

## 2021-04-02 MED ORDER — HYDROCODONE-ACETAMINOPHEN 5-325 MG PO TABS: 1.0000 | ORAL_TABLET | Freq: Four times a day (QID) | ORAL | 0 refills | Status: DC | PRN

## 2021-04-02 MED ORDER — HYDROCODONE-ACETAMINOPHEN 5-325 MG PO TABS: 1.0000 | ORAL_TABLET | Freq: Four times a day (QID) | ORAL | 0 refills | Status: AC | PRN

## 2021-04-09 NOTE — Progress Notes (Signed)
Virtual Visit via Telephone Note  I connected with Stephen Wise on 04/09/21 at  8:15 AM EDT by telephone and verified that I am speaking with the correct person using two identifiers.  Location: Patient: Home Provider: Pain control center   I discussed the limitations, risks, security and privacy concerns of performing an evaluation and management service by telephone and the availability of in person appointments. I also discussed with the patient that there may be a patient responsible charge related to this service. The patient expressed understanding and agreed to proceed.   History of Present Illness: I spoke with Stephen Wise today via telephone for a virtual conference.  He was unable to do the video portion of this.  He reports that he is doing well with his back pain.  The quality characteristic and distribution have been stable and no other changes are reported.  He is doing full-time work and staying active with this.  He tries to actively do stretching strengthening exercises.  He takes his medications as prescribed with no side effects or other problems noted.  He continues to derive good functional medical benefit from the medications.  Otherwise he is in his usual state of health.  No new changes in lower extremity strength or function or bowel or bladder function noted.  Quality characteristic and distribution of the pain otherwise remained stable.  Review of systems: General: No fevers or chills Pulmonary: No shortness of breath or dyspnea Cardiac: No angina or palpitations or lightheadedness GI: No abdominal pain or constipation Psych: No depression    Observations/Objective:  Current Outpatient Medications:    [START ON 05/12/2021] HYDROcodone-acetaminophen (NORCO/VICODIN) 5-325 MG tablet, Take 1 tablet by mouth every 6 (six) hours as needed for moderate pain or severe pain., Disp: 90 tablet, Rfl: 0   amphetamine-dextroamphetamine (ADDERALL XR) 20 MG 24 hr capsule, Take 1  capsule (20 mg total) by mouth every morning., Disp: 30 capsule, Rfl: 0   ferrous sulfate 325 (65 FE) MG tablet, Take 325 mg by mouth daily with breakfast., Disp: , Rfl:    [START ON 04/12/2021] HYDROcodone-acetaminophen (NORCO/VICODIN) 5-325 MG tablet, Take 1 tablet by mouth every 6 (six) hours as needed for moderate pain or severe pain., Disp: 90 tablet, Rfl: 0   mesalamine (PENTASA) 500 MG CR capsule, Take 1,000 mg by mouth 3 (three) times daily as needed. , Disp: , Rfl:    pantoprazole (PROTONIX) 40 MG tablet, Take 40 mg by mouth daily., Disp: , Rfl:    SUMAtriptan (IMITREX) 100 MG tablet, May repeat in 2 hours if headache persists or recurs., Disp: 9 tablet, Rfl: 4   Assessment and Plan: 1. Lumbosacral spondylosis without myelopathy   2. Chronic pain syndrome   3. Long term current use of opiate analgesic   4. Sciatica of left side   5. Facet arthritis of lumbar region   6. Neck pain   7. Spondylolisthesis of lumbar region   8. Disc disease, degenerative, lumbar or lumbosacral   9. Chronic migraine without aura without status migrainosus, not intractable   Based on her discussion today and after review of the [facility] practitioner database information we will refill his medicines for the next 2 months dated for July 11 and August 10.  No other changes will be initiated in his pain management protocol.  I encouraged him to continue with the exercises that we have previously reviewed in addition to his physical therapy stretching strengthening.  Continue follow-up with his primary care physicians and  we will schedule him for 68-monthreturn to clinic.  In the meantime he is scheduled for a routine annual UDS.  Follow Up Instructions:    I discussed the assessment and treatment plan with the patient. The patient was provided an opportunity to ask questions and all were answered. The patient agreed with the plan and demonstrated an understanding of the instructions.   The patient was  advised to call back or seek an in-person evaluation if the symptoms worsen or if the condition fails to improve as anticipated.  I provided 30 minutes of non-face-to-face time during this encounter.   JMolli Barrows MD

## 2021-05-23 LAB — TOXASSURE SELECT 13 (MW), URINE

## 2021-06-08 ENCOUNTER — Other Ambulatory Visit: Payer: Self-pay

## 2021-06-08 ENCOUNTER — Encounter: Payer: Self-pay | Admitting: Anesthesiology

## 2021-06-08 ENCOUNTER — Ambulatory Visit: Payer: 59 | Attending: Anesthesiology | Admitting: Anesthesiology

## 2021-06-08 DIAGNOSIS — M542 Cervicalgia: Secondary | ICD-10-CM

## 2021-06-08 DIAGNOSIS — M47817 Spondylosis without myelopathy or radiculopathy, lumbosacral region: Secondary | ICD-10-CM | POA: Diagnosis not present

## 2021-06-08 DIAGNOSIS — M4316 Spondylolisthesis, lumbar region: Secondary | ICD-10-CM

## 2021-06-08 DIAGNOSIS — Z79891 Long term (current) use of opiate analgesic: Secondary | ICD-10-CM

## 2021-06-08 DIAGNOSIS — M5137 Other intervertebral disc degeneration, lumbosacral region: Secondary | ICD-10-CM

## 2021-06-08 DIAGNOSIS — M47816 Spondylosis without myelopathy or radiculopathy, lumbar region: Secondary | ICD-10-CM

## 2021-06-08 DIAGNOSIS — M5432 Sciatica, left side: Secondary | ICD-10-CM | POA: Diagnosis not present

## 2021-06-08 DIAGNOSIS — G894 Chronic pain syndrome: Secondary | ICD-10-CM | POA: Diagnosis not present

## 2021-06-08 MED ORDER — HYDROCODONE-ACETAMINOPHEN 5-325 MG PO TABS: 1.0000 | ORAL_TABLET | Freq: Three times a day (TID) | ORAL | 0 refills | Status: DC

## 2021-06-08 MED ORDER — HYDROCODONE-ACETAMINOPHEN 5-325 MG PO TABS: 1.0000 | ORAL_TABLET | Freq: Four times a day (QID) | ORAL | 0 refills | Status: AC | PRN

## 2021-06-08 NOTE — Progress Notes (Signed)
Virtual Visit via Telephone Note  I connected with Stephen Wise on 06/08/21 at  1:40 PM EDT by telephone and verified that I am speaking with the correct person using two identifiers.  Location: Patient: Home Provider: [facility] control center   I discussed the limitations, risks, security and privacy concerns of performing an evaluation and management service by telephone and the availability of in person appointments. I also discussed with the patient that there may be a patient responsible charge related to this service. The patient expressed understanding and agreed to proceed.   History of Present Illness:  I spoke with Stephen Wise today via telephone as we could not link for the video portion of the virtual conference but he reports that he is doing well with his back pain.  He still doing his stretching twice a day and this continues to keep his pain under reasonable control.  He takes his hydrocodone 3 times a day and this also is working well for him.  He has failed more conservative therapy and this medication continues to give him good relief and enables him to continue working and continue to manage the long trips he makes to him from work.  He is frequently working at [employer] for portions of each week or so.  No other changes in the quality characteristic or distribution of his low back pain or leg pain have been noted.  Strength is been good bowel bladder function has been good with no side effects associated with his medication management.  Otherwise he is reports being in his usual state of health.  Review of systems: General: No fevers or chills Pulmonary: No shortness of breath or dyspnea Cardiac: No angina or palpitations or lightheadedness GI: No abdominal pain or constipation Psych: No depression  Observations/Objective:   Current Outpatient Medications:    [START ON 07/12/2021] HYDROcodone-acetaminophen (NORCO/VICODIN) 5-325 MG tablet, Take 1 tablet by mouth 3 (three) times  daily., Disp: 90 tablet, Rfl: 0   amphetamine-dextroamphetamine (ADDERALL XR) 20 MG 24 hr capsule, Take 1 capsule (20 mg total) by mouth every morning., Disp: 30 capsule, Rfl: 0   ferrous sulfate 325 (65 FE) MG tablet, Take 325 mg by mouth daily with breakfast., Disp: , Rfl:    [START ON 06/12/2021] HYDROcodone-acetaminophen (NORCO/VICODIN) 5-325 MG tablet, Take 1 tablet by mouth every 6 (six) hours as needed for moderate pain or severe pain., Disp: 90 tablet, Rfl: 0   mesalamine (PENTASA) 500 MG CR capsule, Take 1,000 mg by mouth 3 (three) times daily as needed. , Disp: , Rfl:    pantoprazole (PROTONIX) 40 MG tablet, Take 40 mg by mouth daily., Disp: , Rfl:    SUMAtriptan (IMITREX) 100 MG tablet, May repeat in 2 hours if headache persists or recurs., Disp: 9 tablet, Rfl: 4  Assessment and Plan: 1. Lumbosacral spondylosis without myelopathy   2. Chronic pain syndrome   3. Long term current use of opiate analgesic   4. Sciatica of left side   5. Facet arthritis of lumbar region   6. Neck pain   7. Spondylolisthesis of lumbar region   8. Disc disease, degenerative, lumbar or lumbosacral   Based on our discussion today and after review of the [facility] practitioner database information I am going to schedule him for 2 more months of refills for September 10 and October time.  We will schedule him for return to clinic in 2 months.  We talked about stretching strengthening exercises and I have encouraged him to continue  with these.  No other changes to his pain management regimen will be initiated.  His last urine drug screen was appropriate and we will schedule him for 56-monthreturn to clinic.  He is to continue follow-up with his primary care physicians for baseline medical care.  Follow Up Instructions:    I discussed the assessment and treatment plan with the patient. The patient was provided an opportunity to ask questions and all were answered. The patient agreed with the plan and  demonstrated an understanding of the instructions.   The patient was advised to call back or seek an in-person evaluation if the symptoms worsen or if the condition fails to improve as anticipated.  I provided 30 minutes of non-face-to-face time during this encounter.   JMolli Barrows MD

## 2021-08-04 ENCOUNTER — Encounter: Payer: Self-pay | Admitting: Anesthesiology

## 2021-08-04 ENCOUNTER — Ambulatory Visit: Payer: 59 | Attending: Anesthesiology | Admitting: Anesthesiology

## 2021-08-04 ENCOUNTER — Other Ambulatory Visit: Payer: Self-pay

## 2021-08-04 DIAGNOSIS — Z79891 Long term (current) use of opiate analgesic: Secondary | ICD-10-CM

## 2021-08-04 DIAGNOSIS — G894 Chronic pain syndrome: Secondary | ICD-10-CM

## 2021-08-04 DIAGNOSIS — M4316 Spondylolisthesis, lumbar region: Secondary | ICD-10-CM

## 2021-08-04 DIAGNOSIS — M542 Cervicalgia: Secondary | ICD-10-CM

## 2021-08-04 DIAGNOSIS — M5432 Sciatica, left side: Secondary | ICD-10-CM

## 2021-08-04 DIAGNOSIS — M47816 Spondylosis without myelopathy or radiculopathy, lumbar region: Secondary | ICD-10-CM

## 2021-08-04 DIAGNOSIS — M47817 Spondylosis without myelopathy or radiculopathy, lumbosacral region: Secondary | ICD-10-CM | POA: Diagnosis not present

## 2021-08-04 MED ORDER — HYDROCODONE-ACETAMINOPHEN 5-325 MG PO TABS: 1.0000 | ORAL_TABLET | Freq: Three times a day (TID) | ORAL | 0 refills | Status: AC

## 2021-08-04 MED ORDER — HYDROCODONE-ACETAMINOPHEN 5-325 MG PO TABS: 1.0000 | ORAL_TABLET | Freq: Four times a day (QID) | ORAL | 0 refills | Status: DC | PRN

## 2021-08-04 NOTE — Progress Notes (Signed)
Virtual Visit via Telephone Note  I connected with [name] on 08/04/21 at 12:30 PM EDT by telephone and verified that I am speaking with the correct person using two identifiers.  Location: Patient: Home Provider: Pain control center   I discussed the limitations, risks, security and privacy concerns of performing an evaluation and management service by telephone and the availability of in person appointments. I also discussed with the patient that there may be a patient responsible charge related to this service. The patient expressed understanding and agreed to proceed.   History of Present Illness:  I spoke with [name] today via telephone as we were unable to link for the video portion of the conference.  He states that he is doing well with his current regimen.  He continues to have low back pain with bilateral lower extremity sciatica symptoms that are periodically bad and generally responsive to opioid therapy.  He takes his medications as prescribed and uses these to help keep him functional at work and feeling better during the day and sleeping better at night.  He has failed more conservative therapy but is doing well with the regimen he reports.  He derives good overall functional improvement and no side effects with his narcotics are noted.  He denies any diverting or illicit use either.  He is taking his medications as prescribed and generally gets approximately 75 to 90% relief for about 4 to 6 hours when he takes his medications.  Otherwise he is in his usual state of health with no new changes in lower extremity strength function or bowel or bladder function.  Review of systems: General: No fevers or chills Pulmonary: No shortness of breath or dyspnea Cardiac: No angina or palpitations or lightheadedness GI: No abdominal pain or constipation Psych: No depression  Observations/Objective:  Current Outpatient Medications:    [START ON 09/10/2021] HYDROcodone-acetaminophen  (NORCO/VICODIN) 5-325 MG tablet, Take 1 tablet by mouth every 6 (six) hours as needed for moderate pain or severe pain., Disp: 90 tablet, Rfl: 0   amphetamine-dextroamphetamine (ADDERALL XR) 20 MG 24 hr capsule, Take 1 capsule (20 mg total) by mouth every morning., Disp: 30 capsule, Rfl: 0   ferrous sulfate 325 (65 FE) MG tablet, Take 325 mg by mouth daily with breakfast., Disp: , Rfl:    [START ON 08/11/2021] HYDROcodone-acetaminophen (NORCO/VICODIN) 5-325 MG tablet, Take 1 tablet by mouth 3 (three) times daily., Disp: 90 tablet, Rfl: 0   mesalamine (PENTASA) 500 MG CR capsule, Take 1,000 mg by mouth 3 (three) times daily as needed. , Disp: , Rfl:    pantoprazole (PROTONIX) 40 MG tablet, Take 40 mg by mouth daily., Disp: , Rfl:    SUMAtriptan (IMITREX) 100 MG tablet, May repeat in 2 hours if headache persists or recurs., Disp: 9 tablet, Rfl: 4   Assessment and Plan: 1. Lumbosacral spondylosis without myelopathy   2. Chronic pain syndrome   3. Long term current use of opiate analgesic   4. Sciatica of left side   5. Facet arthritis of lumbar region   6. Neck pain   7. Spondylolisthesis of lumbar region   Based on her discussion today I think is appropriate for refill for his medications.  These be dated for November 9 in December 9.  We have recently checked his urine screen and it was appropriate in August.  On the continue with his current therapy with no other changes made today.  We talked about continue with stretching strengthening exercises and ambulation and  core strengthening.  I do not feel he is a candidate for any interventional therapy.  He is done well with chronic opioid maintenance without side effect and I feel is appropriate to continue.  I will schedule him for 47-monthreturn to clinic and continue follow-up with his primary care physicians for baseline medical care.  Follow Up Instructions:    I discussed the assessment and treatment plan with the patient. The patient was  provided an opportunity to ask questions and all were answered. The patient agreed with the plan and demonstrated an understanding of the instructions.   The patient was advised to call back or seek an in-person evaluation if the symptoms worsen or if the condition fails to improve as anticipated.  I provided 30 minutes of non-face-to-face time during this encounter.   JMolli Barrows MD

## 2021-10-08 ENCOUNTER — Encounter: Payer: Self-pay | Admitting: Anesthesiology

## 2021-10-08 ENCOUNTER — Other Ambulatory Visit: Payer: Self-pay

## 2021-10-08 ENCOUNTER — Ambulatory Visit (HOSPITAL_BASED_OUTPATIENT_CLINIC_OR_DEPARTMENT_OTHER): Payer: 59 | Admitting: Anesthesiology

## 2021-10-08 DIAGNOSIS — M47816 Spondylosis without myelopathy or radiculopathy, lumbar region: Secondary | ICD-10-CM

## 2021-10-08 DIAGNOSIS — G894 Chronic pain syndrome: Secondary | ICD-10-CM

## 2021-10-08 DIAGNOSIS — Z79891 Long term (current) use of opiate analgesic: Secondary | ICD-10-CM | POA: Diagnosis not present

## 2021-10-08 DIAGNOSIS — M4316 Spondylolisthesis, lumbar region: Secondary | ICD-10-CM

## 2021-10-08 DIAGNOSIS — M542 Cervicalgia: Secondary | ICD-10-CM

## 2021-10-08 DIAGNOSIS — M47817 Spondylosis without myelopathy or radiculopathy, lumbosacral region: Secondary | ICD-10-CM | POA: Diagnosis not present

## 2021-10-08 DIAGNOSIS — M5137 Other intervertebral disc degeneration, lumbosacral region: Secondary | ICD-10-CM

## 2021-10-08 DIAGNOSIS — M5432 Sciatica, left side: Secondary | ICD-10-CM

## 2021-10-08 DIAGNOSIS — G43709 Chronic migraine without aura, not intractable, without status migrainosus: Secondary | ICD-10-CM

## 2021-10-08 MED ORDER — HYDROCODONE-ACETAMINOPHEN 5-325 MG PO TABS: 1.0000 | ORAL_TABLET | Freq: Four times a day (QID) | ORAL | 0 refills | Status: AC | PRN

## 2021-10-08 NOTE — Progress Notes (Signed)
Virtual Visit via Telephone Note  I connected with Stephen Wise on 10/08/21 at 11:30 AM EST by telephone and verified that I am speaking with the correct person using two identifiers.  Location: Patient: Home Provider: Pain control center   I discussed the limitations, risks, security and privacy concerns of performing an evaluation and management service by telephone and the availability of in person appointments. I also discussed with the patient that there may be a patient responsible charge related to this service. The patient expressed understanding and agreed to proceed.   History of Present Illness: I spoke with Stephen Wise via telephone as he was unable to do the video portion of the virtual conference.  He reports that his low back pain and leg pain are stable in nature.  They are problematic and occasionally he has increasing sciatica symptoms when he is more active at work.  He just came through a rough cycle with the end of the year but the pain recently has been more stable.  The quality characteristic and distribution and stable as well.  He takes his hydrocodone 3 times a day on average and this continues to give about 75% relief lasting about 4 to 6 hours before he has recurrence of the same quality pain.  No other changes are noted and his lower extremity strength sensation are stable bowel bladder function has been no problem.  He continues to derive good functional benefits with the chronic opioid therapy.  Otherwise he is in his usual state of health other than a recent ongoing bout of COVID from which he appears to be recovering well.  Review of systems: General: No fevers or chills Pulmonary: No shortness of breath or dyspnea Cardiac: No angina or palpitations or lightheadedness GI: No abdominal pain or constipation Psych: No depression    Observations/Objective:  Current Outpatient Medications:    [START ON 11/09/2021] HYDROcodone-acetaminophen (NORCO/VICODIN) 5-325 MG tablet,  Take 1 tablet by mouth every 6 (six) hours as needed for moderate pain or severe pain., Disp: 90 tablet, Rfl: 0   amphetamine-dextroamphetamine (ADDERALL XR) 20 MG 24 hr capsule, Take 1 capsule (20 mg total) by mouth every morning., Disp: 30 capsule, Rfl: 0   ferrous sulfate 325 (65 FE) MG tablet, Take 325 mg by mouth daily with breakfast., Disp: , Rfl:    [START ON 10/10/2021] HYDROcodone-acetaminophen (NORCO/VICODIN) 5-325 MG tablet, Take 1 tablet by mouth every 6 (six) hours as needed for moderate pain or severe pain., Disp: 90 tablet, Rfl: 0   mesalamine (PENTASA) 500 MG CR capsule, Take 1,000 mg by mouth 3 (three) times daily as needed. , Disp: , Rfl:    pantoprazole (PROTONIX) 40 MG tablet, Take 40 mg by mouth daily., Disp: , Rfl:    SUMAtriptan (IMITREX) 100 MG tablet, May repeat in 2 hours if headache persists or recurs., Disp: 9 tablet, Rfl: 4  Past Medical History:  Diagnosis Date   Adult ADHD    Chronic headaches    Chronic pain    Chronic prostatitis    Condyloma acuminatum of penis    Crohn disease ([place])    with terminal ileitis   ED (erectile dysfunction)    Gastric ulcer    Inflammatory bowel disease    Microcytic anemia    Migraines 10/01/2019   Peyronie's disease     Assessment and Plan:  1. Lumbosacral spondylosis without myelopathy   2. Chronic pain syndrome   3. Long term current use of opiate analgesic   4. Sciatica  of left side   5. Facet arthritis of lumbar region   6. Neck pain   7. Spondylolisthesis of lumbar region   8. Disc disease, degenerative, lumbar or lumbosacral   9. Chronic migraine without aura without status migrainosus, not intractable   Based on her discussion today and after review of the [facility] practitioner database information it is appropriate to refill his medicines for the next 2 months dated for January 8 and February 7.  I want him to continue his standard exercise routine as reviewed and continue core strengthening.  There is no  indication for any interventional therapy at this time.  I want him to continue walking and strength training.  Continue follow-up with his primary care physicians and I have encouraged him to seek assistance if he has any exacerbation with his current COVID symptoms.  We will schedule him for 52-monthreturn. Follow Up Instructions:    I discussed the assessment and treatment plan with the patient. The patient was provided an opportunity to ask questions and all were answered. The patient agreed with the plan and demonstrated an understanding of the instructions.   The patient was advised to call back or seek an in-person evaluation if the symptoms worsen or if the condition fails to improve as anticipated.  I provided 30 minutes of non-face-to-face time during this encounter.   JMolli Barrows MD

## 2021-12-06 ENCOUNTER — Other Ambulatory Visit: Payer: Self-pay | Admitting: Anesthesiology

## 2021-12-17 ENCOUNTER — Ambulatory Visit: Payer: 59 | Attending: Anesthesiology | Admitting: Anesthesiology

## 2021-12-17 ENCOUNTER — Other Ambulatory Visit: Payer: Self-pay

## 2021-12-17 ENCOUNTER — Encounter: Payer: Self-pay | Admitting: Anesthesiology

## 2021-12-17 DIAGNOSIS — M47816 Spondylosis without myelopathy or radiculopathy, lumbar region: Secondary | ICD-10-CM

## 2021-12-17 DIAGNOSIS — M47817 Spondylosis without myelopathy or radiculopathy, lumbosacral region: Secondary | ICD-10-CM | POA: Diagnosis not present

## 2021-12-17 DIAGNOSIS — G43709 Chronic migraine without aura, not intractable, without status migrainosus: Secondary | ICD-10-CM

## 2021-12-17 DIAGNOSIS — M5137 Other intervertebral disc degeneration, lumbosacral region: Secondary | ICD-10-CM

## 2021-12-17 DIAGNOSIS — M5432 Sciatica, left side: Secondary | ICD-10-CM | POA: Diagnosis not present

## 2021-12-17 DIAGNOSIS — Z79891 Long term (current) use of opiate analgesic: Secondary | ICD-10-CM

## 2021-12-17 DIAGNOSIS — M542 Cervicalgia: Secondary | ICD-10-CM

## 2021-12-17 DIAGNOSIS — G894 Chronic pain syndrome: Secondary | ICD-10-CM

## 2021-12-17 DIAGNOSIS — M4316 Spondylolisthesis, lumbar region: Secondary | ICD-10-CM

## 2021-12-17 MED ORDER — CYCLOBENZAPRINE HCL 10 MG PO TABS: 10.0000 mg | ORAL_TABLET | Freq: Three times a day (TID) | ORAL | 1 refills | Status: AC | PRN

## 2021-12-17 MED ORDER — HYDROCODONE-ACETAMINOPHEN 5-325 MG PO TABS
1.0000 | ORAL_TABLET | Freq: Four times a day (QID) | ORAL | 0 refills | Status: AC | PRN
Start: 2022-01-15 — End: 2022-02-14

## 2021-12-17 MED ORDER — HYDROCODONE-ACETAMINOPHEN 5-325 MG PO TABS: 1.0000 | ORAL_TABLET | Freq: Four times a day (QID) | ORAL | 0 refills | Status: AC | PRN

## 2021-12-17 NOTE — Progress Notes (Signed)
Virtual Visit via Telephone Note ? ?I connected with Stephen Wise on 12/17/21 at 10:00 AM EDT by telephone and verified that I am speaking with the correct person using two identifiers. ? ?Location: ?Patient: Home ?Provider: Pain control center ?  ?I discussed the limitations, risks, security and privacy concerns of performing an evaluation and management service by telephone and the availability of in person appointments. I also discussed with the patient that there may be a patient responsible charge related to this service. The patient expressed understanding and agreed to proceed. ? ? ?History of Present Illness: ?I spoke with Stephen Wise today via telephone.  We were unable link for the video portion of this conference and he states that he is getting a fair amount of sciatica recently.  This has been occurring over the last 8 to 10 days.  He is trying to do his stretching strengthening exercises and using heat and ice to help with this.  He has been more active with his work.  He is taking his medications as prescribed and these continue to give him good relief but the sciatica symptom has been more severe recently.  No other changes are reported.  Lower extremity strength and function and bowel bladder function have been fine.  He generally gets about 50 to 75% relief with his medications for about 4 to 6 hours before he has recurrence of the same baseline pain.  He tries that he tries to limit his anti-inflammatories to limit his anti-inflammatory use secondary to some GI distress.  He is taking occasional Tylenol.  He has been out of his Flexeril.  He is having some muscle spasming. ? ?Review of systems: ?General: No fevers or chills ?Pulmonary: No shortness of breath or dyspnea ?Cardiac: No angina or palpitations or lightheadedness ?GI: No abdominal pain or constipation ?Psych: No depression  ?  ?Observations/Objective: ? ?Current Outpatient Medications:  ?  cyclobenzaprine (FLEXERIL) 10 MG tablet, Take 1 tablet  (10 mg total) by mouth 3 (three) times daily as needed for muscle spasms., Disp: 60 tablet, Rfl: 1 ?  HYDROcodone-acetaminophen (NORCO/VICODIN) 5-325 MG tablet, Take 1 tablet by mouth every 6 (six) hours as needed for moderate pain or severe pain., Disp: 90 tablet, Rfl: 0 ?  [START ON 01/15/2022] HYDROcodone-acetaminophen (NORCO/VICODIN) 5-325 MG tablet, Take 1 tablet by mouth every 6 (six) hours as needed for moderate pain or severe pain., Disp: 90 tablet, Rfl: 0 ?  amphetamine-dextroamphetamine (ADDERALL XR) 20 MG 24 hr capsule, Take 1 capsule (20 mg total) by mouth every morning., Disp: 30 capsule, Rfl: 0 ?  ferrous sulfate 325 (65 FE) MG tablet, Take 325 mg by mouth daily with breakfast., Disp: , Rfl:  ?  mesalamine (PENTASA) 500 MG CR capsule, Take 1,000 mg by mouth 3 (three) times daily as needed. , Disp: , Rfl:  ?  pantoprazole (PROTONIX) 40 MG tablet, Take 40 mg by mouth daily., Disp: , Rfl:  ?  SUMAtriptan (IMITREX) 100 MG tablet, May repeat in 2 hours if headache persists or recurs., Disp: 9 tablet, Rfl: 4  ? ?Past Medical History:  ?Diagnosis Date  ? Adult ADHD   ? Chronic headaches   ? Chronic pain   ? Chronic prostatitis   ? Condyloma acuminatum of penis   ? Crohn disease ([place])   ? with terminal ileitis  ? ED (erectile dysfunction)   ? Gastric ulcer   ? Inflammatory bowel disease   ? Microcytic anemia   ? Migraines 10/01/2019  ? Peyronie's disease   ?  ? ?  Assessment and Plan: ?1. Lumbosacral spondylosis without myelopathy   ?2. Chronic pain syndrome   ?3. Long term current use of opiate analgesic   ?4. Sciatica of left side   ?5. Facet arthritis of lumbar region   ?6. Neck pain   ?7. Spondylolisthesis of lumbar region   ?8. Disc disease, degenerative, lumbar or lumbosacral   ?9. Chronic migraine without aura without status migrainosus, not intractable   ?Based on our discussion today I think it is appropriate to refill his medicines.  I have reviewed the [facility] practitioner database information I  think it is appropriate.  Refills will be given for today 317 and 415 with return to clinic scheduled in 2 months.  We will have him come in for review at that time.  Continue ice and heat application and some gradual stretching for the low back.  Ultimately if he is not better in 2 weeks I encouraged him to contact us and we may consider an oral steroid preparation Dosepak.  He also may be a candidate for an epidural steroid injection.  Continue follow-up with his primary care physicians for baseline medical care. ? ?Follow Up Instructions: ? ?  ?I discussed the assessment and treatment plan with the patient. The patient was provided an opportunity to ask questions and all were answered. The patient agreed with the plan and demonstrated an understanding of the instructions. ?  ?The patient was advised to call back or seek an in-person evaluation if the symptoms worsen or if the condition fails to improve as anticipated. ? ?I provided 30 minutes of non-face-to-face time during this encounter. ? ? ?Molli Barrows, MD  ?

## 2022-02-14 ENCOUNTER — Encounter: Payer: 59 | Admitting: Anesthesiology

## 2022-02-16 ENCOUNTER — Ambulatory Visit: Payer: 59 | Attending: Anesthesiology | Admitting: Anesthesiology

## 2022-02-16 ENCOUNTER — Other Ambulatory Visit: Payer: Self-pay

## 2022-02-16 ENCOUNTER — Encounter: Payer: Self-pay | Admitting: Anesthesiology

## 2022-02-16 VITALS — BP 137/92 | HR 94 | Temp 98.7°F | Resp 16 | Ht 70.0 in | Wt 220.0 lb

## 2022-02-16 DIAGNOSIS — G43709 Chronic migraine without aura, not intractable, without status migrainosus: Secondary | ICD-10-CM

## 2022-02-16 DIAGNOSIS — M5432 Sciatica, left side: Secondary | ICD-10-CM

## 2022-02-16 DIAGNOSIS — M47816 Spondylosis without myelopathy or radiculopathy, lumbar region: Secondary | ICD-10-CM | POA: Diagnosis present

## 2022-02-16 DIAGNOSIS — Z79891 Long term (current) use of opiate analgesic: Secondary | ICD-10-CM

## 2022-02-16 DIAGNOSIS — M4316 Spondylolisthesis, lumbar region: Secondary | ICD-10-CM

## 2022-02-16 DIAGNOSIS — M47817 Spondylosis without myelopathy or radiculopathy, lumbosacral region: Secondary | ICD-10-CM | POA: Diagnosis not present

## 2022-02-16 DIAGNOSIS — G894 Chronic pain syndrome: Secondary | ICD-10-CM

## 2022-02-16 DIAGNOSIS — M542 Cervicalgia: Secondary | ICD-10-CM

## 2022-02-16 MED ORDER — HYDROCODONE-ACETAMINOPHEN 5-325 MG PO TABS: 1.0000 | ORAL_TABLET | Freq: Four times a day (QID) | ORAL | 0 refills | Status: DC | PRN

## 2022-02-16 MED ORDER — HYDROCODONE-ACETAMINOPHEN 5-325 MG PO TABS: 1.0000 | ORAL_TABLET | Freq: Four times a day (QID) | ORAL | 0 refills | Status: AC | PRN

## 2022-02-16 NOTE — Progress Notes (Signed)
Nursing Pain Medication Assessment:  Safety precautions to be maintained throughout the outpatient stay will include: orient to surroundings, keep bed in low position, maintain call bell within reach at all times, provide assistance with transfer out of bed and ambulation.  Medication Inspection Compliance: Stephen Wise did not comply with our request to bring his pills to be counted. He was reminded that bringing the medication bottles, even when empty, is a requirement.  Medication: None brought in. Pill/Patch Count: None available to be counted. Bottle Appearance: No container available. Did not bring bottle(s) to appointment. Filled Date: N/A Last Medication intake:  Today

## 2022-02-17 NOTE — Progress Notes (Signed)
Subjective:  Patient ID: Stephen Wise, male    DOB: 03/25/1971  Age: 51 y.o. MRN: [id number]  CC: Back Pain (Lumbar bilateral )   Procedure: None  HPI Stephen Wise presents for reevaluation.  He seems to be doing well with his current regimen.  No change in the quality characteristic or distribution of his low back pain or leg pain are noted.  He still working actively and taking his medications as prescribed getting good relief with these.  He reports no side effects.  Otherwise he is in his usual state of health.  No change in lower extremity strength or function or bowel or bladder function.  He takes his medications as prescribed and continues to get approximately 75% improvement for very recalcitrant low back pain and leg pain.  This last for about 6 hours before he has recurrence of the same pain.  He states he is able to stay functional and work actively here in town and frequently is traveling to [facility] on the weekend to help his wife at their house.  With the medications he is able to stay functional sleep well at night and reports no side effects.  Outpatient Medications Prior to Visit  Medication Sig Dispense Refill   amphetamine-dextroamphetamine (ADDERALL XR) 20 MG 24 hr capsule Take 1 capsule (20 mg total) by mouth every morning. 30 capsule 0   butalbital-apap-caffeine-codeine (FIORICET WITH CODEINE) 50-325-40-30 MG capsule Take 1 capsule by mouth daily as needed.     ferrous sulfate 325 (65 FE) MG tablet Take 325 mg by mouth daily with breakfast.     pantoprazole (PROTONIX) 40 MG tablet Take 40 mg by mouth daily.     SUMAtriptan (IMITREX) 100 MG tablet May repeat in 2 hours if headache persists or recurs. 9 tablet 4   HYDROcodone-acetaminophen (NORCO/VICODIN) 5-325 MG tablet Take 1 tablet by mouth every 6 (six) hours as needed.     mesalamine (PENTASA) 500 MG CR capsule Take 1,000 mg by mouth 3 (three) times daily as needed.      No facility-administered medications  prior to visit.    Review of Systems CNS: No confusion or sedation Cardiac: No angina or palpitations GI: No abdominal pain or constipation Constitutional: No nausea vomiting fevers or chills  Objective:  BP (!) 137/92 (BP Location: Left Arm, Patient Position: Sitting, Cuff Size: Large)   Pulse 94   Temp 98.7 F (37.1 C) (Temporal)   Resp 16   Ht 5' 10"  (1.778 m)   Wt 220 lb (99.8 kg)   SpO2 99%   BMI 31.57 kg/m    BP Readings from Last 3 Encounters:  02/16/22 (!) 137/92  03/05/20 138/84  09/05/19 130/76     Wt Readings from Last 3 Encounters:  02/16/22 220 lb (99.8 kg)  03/05/20 206 lb (93.4 kg)  09/05/19 205 lb (93 kg)     Physical Exam Pt is alert and oriented PERRL EOMI HEART IS RRR no murmur or rub LCTA no wheezing or rales MUSCULOSKELETAL reveals some paraspinous muscle tenderness but no overt trigger points.  He has good muscle tone and bulk to the lower extremities.  He has 5 or 5 strength both proximal and distal.  Labs  No results found for: HGBA1C Lab Results  Component Value Date   LDLCALC 73 03/05/2020   CREATININE 0.91 03/05/2020    -------------------------------------------------------------------------------------------------------------------- Lab Results  Component Value Date   WBC 5.1 03/05/2020   HGB 10.6 (L) 03/05/2020   HCT 34.1 (  L) 03/05/2020   PLT 345.0 03/05/2020   GLUCOSE 76 03/05/2020   CHOL 149 03/05/2020   TRIG 121.0 03/05/2020   HDL 51.90 03/05/2020   LDLCALC 73 03/05/2020   ALT 19 03/05/2020   AST 17 03/05/2020   NA 135 03/05/2020   K 4.4 03/05/2020   CL 105 03/05/2020   CREATININE 0.91 03/05/2020   BUN 23 03/05/2020   CO2 27 03/05/2020    --------------------------------------------------------------------------------------------------------------------- DG C-Arm 1-60 Min-No Report  Result Date: 05/08/2018 Fluoroscopy was utilized by the requesting physician.  No radiographic interpretation.      Assessment & Plan:   Stephen Wise was seen today for back pain.  Diagnoses and all orders for this visit:  Lumbosacral spondylosis without myelopathy  Chronic pain syndrome -     ToxASSURE Select 13 (MW), Urine  Long term current use of opiate analgesic -     ToxASSURE Select 13 (MW), Urine  Sciatica of left side  Facet arthritis of lumbar region  Neck pain  Spondylolisthesis of lumbar region  Chronic migraine without aura without status migrainosus, not intractable  Other orders -     HYDROcodone-acetaminophen (NORCO/VICODIN) 5-325 MG tablet; Take 1 tablet by mouth every 6 (six) hours as needed. -     HYDROcodone-acetaminophen (NORCO/VICODIN) 5-325 MG tablet; Take 1 tablet by mouth every 6 (six) hours as needed for moderate pain or severe pain.        ----------------------------------------------------------------------------------------------------------------------  Problem List Items Addressed This Visit       Unprioritized   Chronic pain   Relevant Medications   butalbital-apap-caffeine-codeine (FIORICET WITH CODEINE) 50-325-40-30 MG capsule   HYDROcodone-acetaminophen (NORCO/VICODIN) 5-325 MG tablet   HYDROcodone-acetaminophen (NORCO/VICODIN) 5-325 MG tablet (Start on 03/18/2022)   Other Relevant Orders   ToxASSURE Select 13 (MW), Urine   Migraines   Relevant Medications   butalbital-apap-caffeine-codeine (FIORICET WITH CODEINE) 50-325-40-30 MG capsule   HYDROcodone-acetaminophen (NORCO/VICODIN) 5-325 MG tablet   HYDROcodone-acetaminophen (NORCO/VICODIN) 5-325 MG tablet (Start on 03/18/2022)   Other Visit Diagnoses     Lumbosacral spondylosis without myelopathy    -  Primary   Relevant Medications   butalbital-apap-caffeine-codeine (FIORICET WITH CODEINE) 50-325-40-30 MG capsule   HYDROcodone-acetaminophen (NORCO/VICODIN) 5-325 MG tablet   HYDROcodone-acetaminophen (NORCO/VICODIN) 5-325 MG tablet (Start on 03/18/2022)   Long term current use of opiate  analgesic       Relevant Orders   ToxASSURE Select 13 (MW), Urine   Sciatica of left side       Facet arthritis of lumbar region       Relevant Medications   butalbital-apap-caffeine-codeine (FIORICET WITH CODEINE) 50-325-40-30 MG capsule   HYDROcodone-acetaminophen (NORCO/VICODIN) 5-325 MG tablet   HYDROcodone-acetaminophen (NORCO/VICODIN) 5-325 MG tablet (Start on 03/18/2022)   Neck pain       Spondylolisthesis of lumbar region             ----------------------------------------------------------------------------------------------------------------------  1. Lumbosacral spondylosis without myelopathy Continue working with core stretching strengthening exercises.  Continue physical therapy modalities.  We will schedule him for return to clinic in 2 months.  2. Chronic pain syndrome I have reviewed the [facility] practitioner database information and it is appropriate for refills.  This be dated from May 17 and June 16.  He is scheduled for routine random urine screen. - ToxASSURE Select 13 (MW), Urine  3. Long term current use of opiate analgesic As above - ToxASSURE Select 13 (MW), Urine  4. Sciatica of left side Continue with core strengthening  5. Facet arthritis of lumbar region  As above  6. Neck pain   7. Spondylolisthesis of lumbar region   8. Chronic migraine without aura without status migrainosus, not intractable     ----------------------------------------------------------------------------------------------------------------------  I am having Stephen Wise "Stephen Wise" start on HYDROcodone-acetaminophen. I am also having him maintain his mesalamine, amphetamine-dextroamphetamine, ferrous sulfate, pantoprazole, SUMAtriptan, butalbital-apap-caffeine-codeine, and HYDROcodone-acetaminophen.   Meds ordered this encounter  Medications   HYDROcodone-acetaminophen (NORCO/VICODIN) 5-325 MG tablet    Sig: Take 1 tablet by mouth every 6 (six) hours as  needed.    Dispense:  90 tablet    Refill:  0   HYDROcodone-acetaminophen (NORCO/VICODIN) 5-325 MG tablet    Sig: Take 1 tablet by mouth every 6 (six) hours as needed for moderate pain or severe pain.    Dispense:  90 tablet    Refill:  0   Patient's Medications  New Prescriptions   HYDROCODONE-ACETAMINOPHEN (NORCO/VICODIN) 5-325 MG TABLET    Take 1 tablet by mouth every 6 (six) hours as needed for moderate pain or severe pain.  Previous Medications   AMPHETAMINE-DEXTROAMPHETAMINE (ADDERALL XR) 20 MG 24 HR CAPSULE    Take 1 capsule (20 mg total) by mouth every morning.   BUTALBITAL-APAP-CAFFEINE-CODEINE (FIORICET WITH CODEINE) 50-325-40-30 MG CAPSULE    Take 1 capsule by mouth daily as needed.   FERROUS SULFATE 325 (65 FE) MG TABLET    Take 325 mg by mouth daily with breakfast.   MESALAMINE (PENTASA) 500 MG CR CAPSULE    Take 1,000 mg by mouth 3 (three) times daily as needed.    PANTOPRAZOLE (PROTONIX) 40 MG TABLET    Take 40 mg by mouth daily.   SUMATRIPTAN (IMITREX) 100 MG TABLET    May repeat in 2 hours if headache persists or recurs.  Modified Medications   Modified Medication Previous Medication   HYDROCODONE-ACETAMINOPHEN (NORCO/VICODIN) 5-325 MG TABLET HYDROcodone-acetaminophen (NORCO/VICODIN) 5-325 MG tablet      Take 1 tablet by mouth every 6 (six) hours as needed.    Take 1 tablet by mouth every 6 (six) hours as needed.  Discontinued Medications   No medications on file   ----------------------------------------------------------------------------------------------------------------------  Follow-up: Return in about 2 months (around 04/18/2022) for evaluation, med refill.    Molli Barrows, MD

## 2022-02-22 LAB — TOXASSURE SELECT 13 (MW), URINE

## 2022-04-14 ENCOUNTER — Other Ambulatory Visit: Payer: Self-pay | Admitting: Anesthesiology

## 2022-04-14 ENCOUNTER — Ambulatory Visit: Payer: 59 | Attending: Anesthesiology | Admitting: Anesthesiology

## 2022-04-14 ENCOUNTER — Encounter: Payer: Self-pay | Admitting: Anesthesiology

## 2022-04-14 DIAGNOSIS — G894 Chronic pain syndrome: Secondary | ICD-10-CM | POA: Diagnosis not present

## 2022-04-14 DIAGNOSIS — G43709 Chronic migraine without aura, not intractable, without status migrainosus: Secondary | ICD-10-CM

## 2022-04-14 DIAGNOSIS — Z79891 Long term (current) use of opiate analgesic: Secondary | ICD-10-CM | POA: Diagnosis not present

## 2022-04-14 DIAGNOSIS — M5432 Sciatica, left side: Secondary | ICD-10-CM

## 2022-04-14 DIAGNOSIS — M47816 Spondylosis without myelopathy or radiculopathy, lumbar region: Secondary | ICD-10-CM

## 2022-04-14 DIAGNOSIS — M4316 Spondylolisthesis, lumbar region: Secondary | ICD-10-CM

## 2022-04-14 DIAGNOSIS — M47817 Spondylosis without myelopathy or radiculopathy, lumbosacral region: Secondary | ICD-10-CM | POA: Diagnosis not present

## 2022-04-14 DIAGNOSIS — M542 Cervicalgia: Secondary | ICD-10-CM

## 2022-04-14 MED ORDER — HYDROCODONE-ACETAMINOPHEN 5-325 MG PO TABS: 1.0000 | ORAL_TABLET | Freq: Four times a day (QID) | ORAL | 0 refills | Status: AC | PRN

## 2022-04-14 MED ORDER — HYDROCODONE-ACETAMINOPHEN 5-325 MG PO TABS: 1.0000 | ORAL_TABLET | Freq: Four times a day (QID) | ORAL | 0 refills | Status: DC | PRN

## 2022-04-14 NOTE — Progress Notes (Signed)
Virtual Visit via Telephone Note  I connected with Stephen Wise on 04/14/22 at  3:35 PM EDT by telephone and verified that I am speaking with the correct person using two identifiers.  Location: Patient: Home Provider: Pain control center   I discussed the limitations, risks, security and privacy concerns of performing an evaluation and management service by telephone and the availability of in person appointments. I also discussed with the patient that there may be a patient responsible charge related to this service. The patient expressed understanding and agreed to proceed.   History of Present Illness: I spoke with Stephen Wise via telephone today.  He appears to be doing well.  He is continue to do his band work and stretches trying to help with his low back.  He continues to take his Vicodin as prescribed and this continues to give him good relief.  The back pain that he experiences is unchanged in quality and remains intractable but responds favorably to the hydrocodone.  He takes this 3 times a day.  Gets about 75% relief for about 4 to 6 hours.  It enables him to continue to work full-time and stay active.  No side effects reported.  He denies any diverting or illicit use.  Unfortunately he has failed more conservative therapy but this does continue to work effectively for him.  No change in lower extremity strength function or bowel or bladder function is noted at this time.  Otherwise he appears to be doing quite well with her current regimen.  Review of systems: General: No fevers or chills Pulmonary: No shortness of breath or dyspnea Cardiac: No angina or palpitations or lightheadedness GI: No abdominal pain or constipation Psych: No depression    Observations/Objective:  Current Outpatient Medications:    [START ON 05/17/2022] HYDROcodone-acetaminophen (NORCO/VICODIN) 5-325 MG tablet, Take 1 tablet by mouth every 6 (six) hours as needed for moderate pain or severe pain., Disp: 90 tablet,  Rfl: 0   amphetamine-dextroamphetamine (ADDERALL XR) 20 MG 24 hr capsule, Take 1 capsule (20 mg total) by mouth every morning., Disp: 30 capsule, Rfl: 0   butalbital-apap-caffeine-codeine (FIORICET WITH CODEINE) 50-325-40-30 MG capsule, Take 1 capsule by mouth daily as needed., Disp: , Rfl:    ferrous sulfate 325 (65 FE) MG tablet, Take 325 mg by mouth daily with breakfast., Disp: , Rfl:    [START ON 04/16/2022] HYDROcodone-acetaminophen (NORCO/VICODIN) 5-325 MG tablet, Take 1 tablet by mouth every 6 (six) hours as needed for moderate pain or severe pain., Disp: 90 tablet, Rfl: 0   mesalamine (PENTASA) 500 MG CR capsule, Take 1,000 mg by mouth 3 (three) times daily as needed. , Disp: , Rfl:    pantoprazole (PROTONIX) 40 MG tablet, Take 40 mg by mouth daily., Disp: , Rfl:    SUMAtriptan (IMITREX) 100 MG tablet, May repeat in 2 hours if headache persists or recurs., Disp: 9 tablet, Rfl: 4   Past Medical History:  Diagnosis Date   Adult ADHD    Chronic headaches    Chronic pain    Chronic prostatitis    Condyloma acuminatum of penis    Crohn disease ([place])    with terminal ileitis   ED (erectile dysfunction)    Gastric ulcer    Inflammatory bowel disease    Microcytic anemia    Migraines 10/01/2019   Peyronie's disease      Assessment and Plan: 1. Lumbosacral spondylosis without myelopathy   2. Chronic pain syndrome   3. Long term current use of  opiate analgesic   4. Sciatica of left side   5. Facet arthritis of lumbar region   6. Neck pain   7. Spondylolisthesis of lumbar region   8. Chronic migraine without aura without status migrainosus, not intractable   Based on our conversation today I think it is appropriate to continue to keep Stephen Wise on the Vicodin.  This is working well for him.  He expresses improved overall functioning and improved overall lifestyle and performance.  Refills will be sent in for July 16 and August 15.  I have reviewed the [facility] practitioner database  information and is appropriate.  We talked about multiple exercises and opportunities regarding the stretch band workout routine and core strengthening.  Schedule him for return to clinic in 2 months and continue follow-up with his primary care physician for baseline medical care.  Follow Up Instructions:    I discussed the assessment and treatment plan with the patient. The patient was provided an opportunity to ask questions and all were answered. The patient agreed with the plan and demonstrated an understanding of the instructions.   The patient was advised to call back or seek an in-person evaluation if the symptoms worsen or if the condition fails to improve as anticipated.  I provided 30 minutes of non-face-to-face time during this encounter.   Molli Barrows, MD

## 2022-06-13 ENCOUNTER — Ambulatory Visit: Payer: 59 | Attending: Anesthesiology | Admitting: Anesthesiology

## 2022-06-13 ENCOUNTER — Encounter: Payer: Self-pay | Admitting: Anesthesiology

## 2022-06-13 DIAGNOSIS — M47817 Spondylosis without myelopathy or radiculopathy, lumbosacral region: Secondary | ICD-10-CM

## 2022-06-13 DIAGNOSIS — G894 Chronic pain syndrome: Secondary | ICD-10-CM

## 2022-06-13 DIAGNOSIS — M5137 Other intervertebral disc degeneration, lumbosacral region: Secondary | ICD-10-CM

## 2022-06-13 DIAGNOSIS — G43709 Chronic migraine without aura, not intractable, without status migrainosus: Secondary | ICD-10-CM

## 2022-06-13 DIAGNOSIS — M5432 Sciatica, left side: Secondary | ICD-10-CM | POA: Diagnosis not present

## 2022-06-13 DIAGNOSIS — M47816 Spondylosis without myelopathy or radiculopathy, lumbar region: Secondary | ICD-10-CM

## 2022-06-13 DIAGNOSIS — G8929 Other chronic pain: Secondary | ICD-10-CM

## 2022-06-13 DIAGNOSIS — M542 Cervicalgia: Secondary | ICD-10-CM

## 2022-06-13 DIAGNOSIS — Z79891 Long term (current) use of opiate analgesic: Secondary | ICD-10-CM | POA: Diagnosis not present

## 2022-06-13 DIAGNOSIS — M545 Low back pain, unspecified: Secondary | ICD-10-CM

## 2022-06-13 DIAGNOSIS — M4316 Spondylolisthesis, lumbar region: Secondary | ICD-10-CM

## 2022-06-13 MED ORDER — HYDROCODONE-ACETAMINOPHEN 5-325 MG PO TABS: 1.0000 | ORAL_TABLET | Freq: Four times a day (QID) | ORAL | 0 refills | Status: AC | PRN

## 2022-06-13 MED ORDER — HYDROCODONE-ACETAMINOPHEN 5-325 MG PO TABS: 1.0000 | ORAL_TABLET | Freq: Four times a day (QID) | ORAL | 0 refills | Status: DC | PRN

## 2022-06-13 NOTE — Progress Notes (Signed)
Virtual Visit via Telephone Note  I connected with Stephen Wise on 06/13/22 at  3:45 PM EDT by telephone and verified that I am speaking with the correct person using two identifiers.  Location: Patient: Home  Provider: Pain control center   I discussed the limitations, risks, security and privacy concerns of performing an evaluation and management service by telephone and the availability of in person appointments. I also discussed with the patient that there may be a patient responsible charge related to this service. The patient expressed understanding and agreed to proceed.   History of Present Illness: I spoke with Stephen Wise via telephone as we are unable like for the video portion of the conference.  He reports that he has been doing well with his current regimen.  His low back pain is generally well controlled.  He did have a recent bout of some spasming but is dealing with this well and working to alleviate that.  No change in lower extremity strength or function or bowel or bladder function is noted.  He is taking his medications as prescribed and these continue to work well for him.  No side effects are reported.  Otherwise he is in his usual state of health at this point.  He is trying to stay active doing his physical therapy stretching strengthening exercises and we gone over some of these.  Review of systems: General: No fevers or chills Pulmonary: No shortness of breath or dyspnea Cardiac: No angina or palpitations or lightheadedness GI: No abdominal pain or constipation Psych: No depression    Observations/Objective:  Current Outpatient Medications:    [START ON 07/18/2022] HYDROcodone-acetaminophen (NORCO/VICODIN) 5-325 MG tablet, Take 1 tablet by mouth every 6 (six) hours as needed for moderate pain or severe pain., Disp: 90 tablet, Rfl: 0   amphetamine-dextroamphetamine (ADDERALL XR) 20 MG 24 hr capsule, Take 1 capsule (20 mg total) by mouth every morning., Disp: 30  capsule, Rfl: 0   butalbital-apap-caffeine-codeine (FIORICET WITH CODEINE) 50-325-40-30 MG capsule, Take 1 capsule by mouth daily as needed., Disp: , Rfl:    ferrous sulfate 325 (65 FE) MG tablet, Take 325 mg by mouth daily with breakfast., Disp: , Rfl:    [START ON 06/18/2022] HYDROcodone-acetaminophen (NORCO/VICODIN) 5-325 MG tablet, Take 1 tablet by mouth every 6 (six) hours as needed for moderate pain or severe pain., Disp: 90 tablet, Rfl: 0   mesalamine (PENTASA) 500 MG CR capsule, Take 1,000 mg by mouth 3 (three) times daily as needed. , Disp: , Rfl:    pantoprazole (PROTONIX) 40 MG tablet, Take 40 mg by mouth daily., Disp: , Rfl:    SUMAtriptan (IMITREX) 100 MG tablet, May repeat in 2 hours if headache persists or recurs., Disp: 9 tablet, Rfl: 4   Past Medical History:  Diagnosis Date   Adult ADHD    Chronic headaches    Chronic pain    Chronic prostatitis    Condyloma acuminatum of penis    Crohn disease ([place])    with terminal ileitis   ED (erectile dysfunction)    Gastric ulcer    Inflammatory bowel disease    Microcytic anemia    Migraines 10/01/2019   Peyronie's disease      Assessment and Plan: 1. Lumbosacral spondylosis without myelopathy   2. Chronic pain syndrome   3. Long term current use of opiate analgesic   4. Sciatica of left side   5. Facet arthritis of lumbar region   6. Neck pain   7. Spondylolisthesis  of lumbar region   8. Chronic migraine without aura without status migrainosus, not intractable   9. Disc disease, degenerative, lumbar or lumbosacral   10. Chronic left-sided low back pain without sciatica   I am pleased that he is doing as well as he is.  He staying active and sleeping better at night and continue to derive good functional improvement with his medication whereas he has failed more conservative therapy.  He still has a fair amount of back pain that is moderate in severity but better with the medication management.  He staying active and  working full-time.  No side effects reported.  I want him to continue with his physical therapy exercises as reviewed today and we have described a few extra exercises for him with return to clinic scheduled in 2 months.  He is to continue follow-up with his primary care physicians for baseline medical care.  Follow Up Instructions:    I discussed the assessment and treatment plan with the patient. The patient was provided an opportunity to ask questions and all were answered. The patient agreed with the plan and demonstrated an understanding of the instructions.   The patient was advised to call back or seek an in-person evaluation if the symptoms worsen or if the condition fails to improve as anticipated.  I provided 30 minutes of non-face-to-face time during this encounter.   Molli Barrows, MD

## 2022-08-16 ENCOUNTER — Encounter: Payer: Self-pay | Admitting: Anesthesiology

## 2022-08-16 ENCOUNTER — Ambulatory Visit: Payer: 59 | Attending: Anesthesiology | Admitting: Anesthesiology

## 2022-08-16 DIAGNOSIS — G43709 Chronic migraine without aura, not intractable, without status migrainosus: Secondary | ICD-10-CM

## 2022-08-16 DIAGNOSIS — M5432 Sciatica, left side: Secondary | ICD-10-CM | POA: Diagnosis not present

## 2022-08-16 DIAGNOSIS — G894 Chronic pain syndrome: Secondary | ICD-10-CM

## 2022-08-16 DIAGNOSIS — Z79891 Long term (current) use of opiate analgesic: Secondary | ICD-10-CM | POA: Diagnosis not present

## 2022-08-16 DIAGNOSIS — M47816 Spondylosis without myelopathy or radiculopathy, lumbar region: Secondary | ICD-10-CM

## 2022-08-16 DIAGNOSIS — M5137 Other intervertebral disc degeneration, lumbosacral region: Secondary | ICD-10-CM

## 2022-08-16 DIAGNOSIS — M542 Cervicalgia: Secondary | ICD-10-CM

## 2022-08-16 DIAGNOSIS — M47817 Spondylosis without myelopathy or radiculopathy, lumbosacral region: Secondary | ICD-10-CM | POA: Diagnosis not present

## 2022-08-16 DIAGNOSIS — M4316 Spondylolisthesis, lumbar region: Secondary | ICD-10-CM

## 2022-08-16 MED ORDER — HYDROCODONE-ACETAMINOPHEN 5-325 MG PO TABS: 1.0000 | ORAL_TABLET | Freq: Four times a day (QID) | ORAL | 0 refills | Status: AC | PRN

## 2022-08-16 MED ORDER — HYDROCODONE-ACETAMINOPHEN 5-325 MG PO TABS: 1.0000 | ORAL_TABLET | Freq: Four times a day (QID) | ORAL | 0 refills | Status: DC | PRN

## 2022-08-16 MED ORDER — CYCLOBENZAPRINE HCL 10 MG PO TABS: 10.0000 mg | ORAL_TABLET | Freq: Two times a day (BID) | ORAL | 3 refills | Status: DC | PRN

## 2022-08-16 NOTE — Progress Notes (Signed)
Virtual Visit via Telephone Note  I connected with Stephen Wise on 08/16/22 at  3:40 PM EST by telephone and verified that I am speaking with the correct person using two identifiers.  Location: Patient: Home Provider: Pain control center   I discussed the limitations, risks, security and privacy concerns of performing an evaluation and management service by telephone and the availability of in person appointments. I also discussed with the patient that there may be a patient responsible charge related to this service. The patient expressed understanding and agreed to proceed.   History of Present Illness: I spoke to Stephen Wise via telephone as we are unable link for the video portion of the conference but he reports that has been doing well with his low back.  He still has some periodic spasms in the low back but the sharp crescendo pain we had spoken of at his last visit has resolved.  He is not experiencing any significant sciatica symptoms at present.  He still doing his stretching exercises which continue to help.  He takes hydrocodone approximately 3 times a day and this also keeps his pain under good control and enables him to continue to stay functional active and do his job.  No other changes in the quality characteristic or distribution of his low back pain are noted no change in lower extremity strength function or bowel or bladder function is noted.  Review of systems: General: No fevers or chills Pulmonary: No shortness of breath or dyspnea Cardiac: No angina or palpitations or lightheadedness GI: No abdominal pain or constipation Psych: No depression    Observations/Objective:   Current Outpatient Medications:    cyclobenzaprine (FLEXERIL) 10 MG tablet, Take 1 tablet (10 mg total) by mouth 2 (two) times daily as needed for muscle spasms., Disp: 60 tablet, Rfl: 3   [START ON 09/17/2022] HYDROcodone-acetaminophen (NORCO/VICODIN) 5-325 MG tablet, Take 1 tablet by mouth every 6  (six) hours as needed for moderate pain or severe pain., Disp: 90 tablet, Rfl: 0   amphetamine-dextroamphetamine (ADDERALL XR) 20 MG 24 hr capsule, Take 1 capsule (20 mg total) by mouth every morning., Disp: 30 capsule, Rfl: 0   butalbital-apap-caffeine-codeine (FIORICET WITH CODEINE) 50-325-40-30 MG capsule, Take 1 capsule by mouth daily as needed., Disp: , Rfl:    ferrous sulfate 325 (65 FE) MG tablet, Take 325 mg by mouth daily with breakfast., Disp: , Rfl:    [START ON 08/18/2022] HYDROcodone-acetaminophen (NORCO/VICODIN) 5-325 MG tablet, Take 1 tablet by mouth every 6 (six) hours as needed for moderate pain or severe pain., Disp: 90 tablet, Rfl: 0   mesalamine (PENTASA) 500 MG CR capsule, Take 1,000 mg by mouth 3 (three) times daily as needed. , Disp: , Rfl:    pantoprazole (PROTONIX) 40 MG tablet, Take 40 mg by mouth daily., Disp: , Rfl:    SUMAtriptan (IMITREX) 100 MG tablet, May repeat in 2 hours if headache persists or recurs., Disp: 9 tablet, Rfl: 4   Past Medical History:  Diagnosis Date   Adult ADHD    Chronic headaches    Chronic pain    Chronic prostatitis    Condyloma acuminatum of penis    Crohn disease ([place])    with terminal ileitis   ED (erectile dysfunction)    Gastric ulcer    Inflammatory bowel disease    Microcytic anemia    Migraines 10/01/2019   Peyronie's disease     Assessment and Plan:  1. Lumbosacral spondylosis without myelopathy   2. Chronic  pain syndrome   3. Long term current use of opiate analgesic   4. Sciatica of left side   5. Facet arthritis of lumbar region   6. Neck pain   7. Spondylolisthesis of lumbar region   8. Chronic migraine without aura without status migrainosus, not intractable   9. Disc disease, degenerative, lumbar or lumbosacral   Based on our conversation today I think it is appropriate to continue him on his current medications.  I have reviewed the [facility] practitioner database information is appropriate for refill.   Continue doing core stretching strengthening exercises as reviewed and return to clinic in 2 months.  Today's prescriptions are dated for November 16 and December 16.  Continue follow-up with his primary care physicians for baseline medical care. Follow Up Instructions:    I discussed the assessment and treatment plan with the patient. The patient was provided an opportunity to ask questions and all were answered. The patient agreed with the plan and demonstrated an understanding of the instructions.   The patient was advised to call back or seek an in-person evaluation if the symptoms worsen or if the condition fails to improve as anticipated.  I provided 30 minutes of non-face-to-face time during this encounter.   Molli Barrows, MD

## 2022-09-16 ENCOUNTER — Telehealth: Payer: Self-pay

## 2022-09-16 NOTE — Telephone Encounter (Signed)
Spoke with pharmacy and gave permission to fill one day early.

## 2022-09-16 NOTE — Telephone Encounter (Signed)
He wants to know if you can call the pharmacy and let him get his meds filled a day early, today,  his wife is having chemo and he wants to get them picked up before he goes back to the beach.

## 2022-10-13 ENCOUNTER — Ambulatory Visit: Payer: 59 | Attending: Anesthesiology | Admitting: Anesthesiology

## 2022-10-13 ENCOUNTER — Encounter: Payer: Self-pay | Admitting: Anesthesiology

## 2022-10-13 DIAGNOSIS — G894 Chronic pain syndrome: Secondary | ICD-10-CM

## 2022-10-13 DIAGNOSIS — M4316 Spondylolisthesis, lumbar region: Secondary | ICD-10-CM

## 2022-10-13 DIAGNOSIS — M47817 Spondylosis without myelopathy or radiculopathy, lumbosacral region: Secondary | ICD-10-CM

## 2022-10-13 DIAGNOSIS — M47816 Spondylosis without myelopathy or radiculopathy, lumbar region: Secondary | ICD-10-CM

## 2022-10-13 DIAGNOSIS — M5432 Sciatica, left side: Secondary | ICD-10-CM | POA: Diagnosis not present

## 2022-10-13 DIAGNOSIS — Z79891 Long term (current) use of opiate analgesic: Secondary | ICD-10-CM

## 2022-10-13 DIAGNOSIS — M542 Cervicalgia: Secondary | ICD-10-CM

## 2022-10-13 MED ORDER — HYDROCODONE-ACETAMINOPHEN 5-325 MG PO TABS: 1.0000 | ORAL_TABLET | Freq: Four times a day (QID) | ORAL | 0 refills | Status: AC | PRN

## 2022-10-13 MED ORDER — BUTALBITAL-APAP-CAFFEINE 50-325-40 MG PO TABS: 1.0000 | ORAL_TABLET | Freq: Four times a day (QID) | ORAL | 0 refills | Status: AC | PRN

## 2022-10-13 MED ORDER — CYCLOBENZAPRINE HCL 10 MG PO TABS: 10.0000 mg | ORAL_TABLET | Freq: Two times a day (BID) | ORAL | 3 refills | Status: DC | PRN

## 2022-10-13 MED ORDER — HYDROCODONE-ACETAMINOPHEN 5-325 MG PO TABS: 1.0000 | ORAL_TABLET | Freq: Four times a day (QID) | ORAL | 0 refills | Status: DC | PRN

## 2022-10-13 NOTE — Progress Notes (Signed)
Virtual Visit via Telephone Note  I connected with Stephen Wise on 10/13/22 at 11:00 AM EST by telephone and verified that I am speaking with the correct person using two identifiers.  Location: Patient: Home Provider: [email or web address]  I discussed the limitations, risks, security and privacy concerns of performing an evaluation and management service by telephone and the availability of in person appointments. I also discussed with the patient that there may be a patient responsible charge related to this service. The patient expressed understanding and agreed to proceed.   History of Present Illness: I spoke with Stephen Wise today via telephone.  We were unable lengthen the video portion of the conference he reports that his low back pain has been acting up some recently.  He is also having a few more of the migraine type headaches he has had historically.  The quality characteristic and distribution of those headaches are stable and generally well-controlled.  Occasionally he has breakthrough requiring butalbital for management.  He has had a very complicated situation recently.  His wife has been diagnosed with cancer and is undergoing chemotherapy treatment.  This is caused quite a bit of psychosocial trouble and emotional duress for him.  The headaches have been slightly worse.  Furthermore his back has been acting up periodically.  He takes his medications and these continue to work well for him.  He denies any diverting or illicit use.  The quality characteristic and distribution of his low back pain and leg pain are stable in nature.  Sciatica is under control.  He is try to stay active and do his physical therapy and run a full-time job.  Review of systems: General: No fevers or chills Pulmonary: No shortness of breath or dyspnea Cardiac: No angina or palpitations or lightheadedness GI: No abdominal pain or constipation Psych: No depression    Observations/Objective:  Current  Outpatient Medications:    butalbital-acetaminophen-caffeine (FIORICET) 50-325-40 MG tablet, Take 1-2 tablets by mouth every 6 (six) hours as needed for headache., Disp: 20 tablet, Rfl: 0   [START ON 11/15/2022] HYDROcodone-acetaminophen (NORCO/VICODIN) 5-325 MG tablet, Take 1 tablet by mouth every 6 (six) hours as needed for moderate pain or severe pain., Disp: 90 tablet, Rfl: 0   amphetamine-dextroamphetamine (ADDERALL XR) 20 MG 24 hr capsule, Take 1 capsule (20 mg total) by mouth every morning., Disp: 30 capsule, Rfl: 0   butalbital-apap-caffeine-codeine (FIORICET WITH CODEINE) 50-325-40-30 MG capsule, Take 1 capsule by mouth daily as needed., Disp: , Rfl:    cyclobenzaprine (FLEXERIL) 10 MG tablet, Take 1 tablet (10 mg total) by mouth 2 (two) times daily as needed for muscle spasms., Disp: 60 tablet, Rfl: 3   ferrous sulfate 325 (65 FE) MG tablet, Take 325 mg by mouth daily with breakfast., Disp: , Rfl:    [START ON 10/15/2022] HYDROcodone-acetaminophen (NORCO/VICODIN) 5-325 MG tablet, Take 1 tablet by mouth every 6 (six) hours as needed for moderate pain or severe pain., Disp: 90 tablet, Rfl: 0   mesalamine (PENTASA) 500 MG CR capsule, Take 1,000 mg by mouth 3 (three) times daily as needed. , Disp: , Rfl:    pantoprazole (PROTONIX) 40 MG tablet, Take 40 mg by mouth daily., Disp: , Rfl:    SUMAtriptan (IMITREX) 100 MG tablet, May repeat in 2 hours if headache persists or recurs., Disp: 9 tablet, Rfl: 4   Past Medical History:  Diagnosis Date   Adult ADHD    Chronic headaches    Chronic pain    Chronic  prostatitis    Condyloma acuminatum of penis    Crohn disease ([place])    with terminal ileitis   ED (erectile dysfunction)    Gastric ulcer    Inflammatory bowel disease    Microcytic anemia    Migraines 10/01/2019   Peyronie's disease      Assessment and Plan: 1. Lumbosacral spondylosis without myelopathy   2. Chronic pain syndrome   3. Long term current use of opiate analgesic   4.  Sciatica of left side   5. Facet arthritis of lumbar region   6. Neck pain   7. Spondylolisthesis of lumbar region   Based on our discussion today and after review of the [facility] practitioner database information it is appropriate refill his medicines for the next 2 months.  He is doing well on this regimen for his chronic low back pain.  This has been persistent and has not responded to more conservative therapy.  He is doing well with the opioid regimen without side effect.  He staying active and able to do routine daily living activities.  He is taking care of his wife presently.  I will also refill his butalbital till he can get into see one of the headache specialist he seen in the past.  He is scheduled to do so within the next month or so.  Continue follow-up with his primary care physicians for baseline medical care with return to clinic scheduled in 2 months.  Follow Up Instructions:    I discussed the assessment and treatment plan with the patient. The patient was provided an opportunity to ask questions and all were answered. The patient agreed with the plan and demonstrated an understanding of the instructions.   The patient was advised to call back or seek an in-person evaluation if the symptoms worsen or if the condition fails to improve as anticipated.  I provided 30 minutes of non-face-to-face time during this encounter.   Molli Barrows, MD

## 2022-12-13 ENCOUNTER — Encounter: Payer: Self-pay | Admitting: Anesthesiology

## 2022-12-13 ENCOUNTER — Ambulatory Visit: Payer: Self-pay | Attending: Anesthesiology | Admitting: Anesthesiology

## 2022-12-13 DIAGNOSIS — Z79891 Long term (current) use of opiate analgesic: Secondary | ICD-10-CM

## 2022-12-13 DIAGNOSIS — M4316 Spondylolisthesis, lumbar region: Secondary | ICD-10-CM

## 2022-12-13 DIAGNOSIS — M47817 Spondylosis without myelopathy or radiculopathy, lumbosacral region: Secondary | ICD-10-CM

## 2022-12-13 DIAGNOSIS — G43709 Chronic migraine without aura, not intractable, without status migrainosus: Secondary | ICD-10-CM

## 2022-12-13 DIAGNOSIS — M47816 Spondylosis without myelopathy or radiculopathy, lumbar region: Secondary | ICD-10-CM

## 2022-12-13 DIAGNOSIS — M5432 Sciatica, left side: Secondary | ICD-10-CM

## 2022-12-13 DIAGNOSIS — G894 Chronic pain syndrome: Secondary | ICD-10-CM

## 2022-12-13 DIAGNOSIS — M542 Cervicalgia: Secondary | ICD-10-CM

## 2022-12-13 MED ORDER — HYDROCODONE-ACETAMINOPHEN 5-325 MG PO TABS: 1.0000 | ORAL_TABLET | Freq: Four times a day (QID) | ORAL | 0 refills | Status: DC | PRN

## 2022-12-13 MED ORDER — HYDROCODONE-ACETAMINOPHEN 5-325 MG PO TABS: 1.0000 | ORAL_TABLET | Freq: Four times a day (QID) | ORAL | 0 refills | Status: AC | PRN

## 2022-12-13 NOTE — Progress Notes (Signed)
Virtual Visit via Telephone Note  I connected with Stephen Wise on 12/13/22 at  1:20 PM EDT by telephone and verified that I am speaking with the correct person using two identifiers.  Location: Patient: Home Provider: [facility]   I discussed the limitations, risks, security and privacy concerns of performing an evaluation and management service by telephone and the availability of in person appointments. I also discussed with the patient that there may be a patient responsible charge related to this service. The patient expressed understanding and agreed to proceed.   History of Present Illness: I spoke with Stephen Wise this afternoon via telephone as we were unable like for the video portion of this conference and he reports that he is doing pretty well.  Recently his back pain has been under better control with no recent spasms or sciatica to the left lower extremity as he was experiencing a few months ago.  He has continue to work on his stretching strengthening exercises and has backed off some of the activity that he felt was precipitating this.  His neck pain is also been stable.  He is still taking care of his wife which does require some significant assistance but the medicines are working well for him.  He is taking his hydrocodone 3 times a day without side effect and this continues to give him about 75% relief when he takes it.  Despite doing the physical therapy and stretching exercises he remains reliant on the chronic opioid therapy.  Otherwise he is in his usual state of health with no new changes in lower extremity strength function or bowel or bladder function.  Review of systems: General: No fevers or chills Pulmonary: No shortness of breath or dyspnea Cardiac: No angina or palpitations or lightheadedness GI: No abdominal pain or constipation Psych: No depression    Observations/Objective:  Current Outpatient Medications:    [START ON 01/16/2023]  HYDROcodone-acetaminophen (NORCO/VICODIN) 5-325 MG tablet, Take 1 tablet by mouth every 6 (six) hours as needed for moderate pain or severe pain., Disp: 90 tablet, Rfl: 0   amphetamine-dextroamphetamine (ADDERALL XR) 20 MG 24 hr capsule, Take 1 capsule (20 mg total) by mouth every morning., Disp: 30 capsule, Rfl: 0   butalbital-acetaminophen-caffeine (FIORICET) 50-325-40 MG tablet, Take 1-2 tablets by mouth every 6 (six) hours as needed for headache., Disp: 20 tablet, Rfl: 0   butalbital-apap-caffeine-codeine (FIORICET WITH CODEINE) 50-325-40-30 MG capsule, Take 1 capsule by mouth daily as needed., Disp: , Rfl:    cyclobenzaprine (FLEXERIL) 10 MG tablet, Take 1 tablet (10 mg total) by mouth 2 (two) times daily as needed for muscle spasms., Disp: 60 tablet, Rfl: 3   ferrous sulfate 325 (65 FE) MG tablet, Take 325 mg by mouth daily with breakfast., Disp: , Rfl:    [START ON 12/17/2022] HYDROcodone-acetaminophen (NORCO/VICODIN) 5-325 MG tablet, Take 1 tablet by mouth every 6 (six) hours as needed for moderate pain or severe pain., Disp: 90 tablet, Rfl: 0   mesalamine (PENTASA) 500 MG CR capsule, Take 1,000 mg by mouth 3 (three) times daily as needed. , Disp: , Rfl:    pantoprazole (PROTONIX) 40 MG tablet, Take 40 mg by mouth daily., Disp: , Rfl:    SUMAtriptan (IMITREX) 100 MG tablet, May repeat in 2 hours if headache persists or recurs., Disp: 9 tablet, Rfl: 4   Past Medical History:  Diagnosis Date   Adult ADHD    Chronic headaches    Chronic pain    Chronic prostatitis  Condyloma acuminatum of penis    Crohn disease ([place])    with terminal ileitis   ED (erectile dysfunction)    Gastric ulcer    Inflammatory bowel disease    Microcytic anemia    Migraines 10/01/2019   Peyronie's disease      Assessment and Plan: 1. Lumbosacral spondylosis without myelopathy   2. Chronic pain syndrome   3. Long term current use of opiate analgesic   4. Sciatica of left side   5. Facet arthritis of  lumbar region   6. Neck pain   7. Spondylolisthesis of lumbar region   8. Chronic migraine without aura without status migrainosus, not intractable    Based on our conversation and upon review of the [facility] practitioner database information it is appropriate to refill his medicines for the next 2 months.  His be dated for March 16 and April 15.  No other changes in his pharmacologic regimen will be initiated.  I encouraged him to continue with stretching strengthening exercises with follow-up scheduled in 2 months.  Continue follow-up with his primary care physicians for baseline medical care additionally.  Follow Up Instructions:    I discussed the assessment and treatment plan with the patient. The patient was provided an opportunity to ask questions and all were answered. The patient agreed with the plan and demonstrated an understanding of the instructions.   The patient was advised to call back or seek an in-person evaluation if the symptoms worsen or if the condition fails to improve as anticipated.  I provided 30 minutes of non-face-to-face time during this encounter.   Molli Barrows, MD

## 2022-12-15 ENCOUNTER — Telehealth: Payer: Self-pay | Admitting: Anesthesiology

## 2022-12-15 NOTE — Telephone Encounter (Signed)
PT stated that he has to go out of town to take wife back to get a ct scan. PT wanted to see if Dr. Andree Elk will allow him to pick up hydrocodone early. Please give patient a call. TY

## 2022-12-16 NOTE — Telephone Encounter (Signed)
Voicemail left;  Stephen Wise will not do an early fill without having a new Rx sent in.  Dr Andree Elk is not in the office today, nor any other provider so he will have to wait until tomorrow to fill Rx.  Sorry for the inconvenience.

## 2023-02-09 ENCOUNTER — Encounter: Payer: Self-pay | Admitting: Anesthesiology

## 2023-02-09 ENCOUNTER — Ambulatory Visit: Payer: Self-pay | Attending: Anesthesiology | Admitting: Anesthesiology

## 2023-02-09 VITALS — BP 155/91 | HR 93 | Temp 97.1°F | Resp 16

## 2023-02-09 DIAGNOSIS — G894 Chronic pain syndrome: Secondary | ICD-10-CM | POA: Insufficient documentation

## 2023-02-09 DIAGNOSIS — M542 Cervicalgia: Secondary | ICD-10-CM | POA: Insufficient documentation

## 2023-02-09 DIAGNOSIS — M47816 Spondylosis without myelopathy or radiculopathy, lumbar region: Secondary | ICD-10-CM | POA: Insufficient documentation

## 2023-02-09 DIAGNOSIS — Z79891 Long term (current) use of opiate analgesic: Secondary | ICD-10-CM | POA: Insufficient documentation

## 2023-02-09 DIAGNOSIS — M47817 Spondylosis without myelopathy or radiculopathy, lumbosacral region: Secondary | ICD-10-CM | POA: Insufficient documentation

## 2023-02-09 DIAGNOSIS — M5432 Sciatica, left side: Secondary | ICD-10-CM | POA: Insufficient documentation

## 2023-02-09 MED ORDER — CYCLOBENZAPRINE HCL 10 MG PO TABS: 10.0000 mg | ORAL_TABLET | Freq: Two times a day (BID) | ORAL | 3 refills | Status: AC | PRN

## 2023-02-09 MED ORDER — HYDROCODONE-ACETAMINOPHEN 5-325 MG PO TABS: 1.0000 | ORAL_TABLET | Freq: Four times a day (QID) | ORAL | 0 refills | Status: AC | PRN

## 2023-02-09 MED ORDER — HYDROCODONE-ACETAMINOPHEN 5-325 MG PO TABS: 1.0000 | ORAL_TABLET | Freq: Four times a day (QID) | ORAL | 0 refills | Status: DC | PRN

## 2023-02-09 NOTE — Progress Notes (Signed)
Subjective:  Patient ID: Stephen Wise, male    DOB: 09-22-1971  Age: 52 y.o. MRN: [id number]  CC: Back Pain (Left, lower)   Procedure: None  HPI Stephen Wise presents for reevaluation.  Stephen Wise was seen in clinic secondary to chronic low back pain.  He maintains that he is doing reasonably well.  He still has some left side greater than right lower back pain that is been recalcitrant and unresponsive to conservative therapy.  He has continued to have intermittent sciatica.  He generally gets about 70% improvement with hydrocodone for his low back pain.  This last about 6 to 8 hours if he is under circumstances of low activity and gives him 4 to 6 hours if he is more active with work.  Without the medication he is unable to fulfill his responsibilities at work.  He denies any sedation or problems such as side effects with the medication.  He maintains a chronic opioid therapy enables him to stay functional active and sleep better at night.  He has been on anti-inflammatories muscle relaxants alone and physical therapy exercises with limited success.  Otherwise he is in his usual state of health with no new changes in lower extremity strength function bowel or bladder function.  The quality of the pain that he has been experiencing is stable in nature without recent change.  Outpatient Medications Prior to Visit  Medication Sig Dispense Refill   amphetamine-dextroamphetamine (ADDERALL XR) 20 MG 24 hr capsule Take 1 capsule (20 mg total) by mouth every morning. 30 capsule 0   butalbital-acetaminophen-caffeine (FIORICET) 50-325-40 MG tablet Take 1-2 tablets by mouth every 6 (six) hours as needed for headache. 20 tablet 0   butalbital-apap-caffeine-codeine (FIORICET WITH CODEINE) 50-325-40-30 MG capsule Take 1 capsule by mouth daily as needed.     ferrous sulfate 325 (65 FE) MG tablet Take 325 mg by mouth daily with breakfast.     pantoprazole (PROTONIX) 40 MG tablet Take 40 mg by mouth daily.      SUMAtriptan (IMITREX) 100 MG tablet May repeat in 2 hours if headache persists or recurs. 9 tablet 4   cyclobenzaprine (FLEXERIL) 10 MG tablet Take 1 tablet (10 mg total) by mouth 2 (two) times daily as needed for muscle spasms. 60 tablet 3   HYDROcodone-acetaminophen (NORCO/VICODIN) 5-325 MG tablet Take 1 tablet by mouth every 6 (six) hours as needed for moderate pain or severe pain. 90 tablet 0   mesalamine (PENTASA) 500 MG CR capsule Take 1,000 mg by mouth 3 (three) times daily as needed.      No facility-administered medications prior to visit.    Review of Systems CNS: No confusion or sedation Cardiac: No angina or palpitations GI: No abdominal pain or constipation Constitutional: No nausea vomiting fevers or chills  Objective:  BP (!) 155/91   Pulse 93   Temp (!) 97.1 F (36.2 C) (Temporal)   Resp 16   SpO2 100%    BP Readings from Last 3 Encounters:  02/09/23 (!) 155/91  02/16/22 (!) 137/92  03/05/20 138/84     Wt Readings from Last 3 Encounters:  02/16/22 220 lb (99.8 kg)  03/05/20 206 lb (93.4 kg)  09/05/19 205 lb (93 kg)     Physical Exam Pt is alert and oriented PERRL EOMI HEART IS RRR no murmur or rub LCTA no wheezing or rales MUSCULOSKELETAL reveals some paraspinous muscle tenderness with 1 trigger point in the left lower low back region about L5 4 cm to the  left of midline.  Less muscle spasm on the right side.  He is able to go from seated to standing without difficulty.  He does have pain with left lateral extension and rotation with less symptomatic pain with right lateral rotation.  Lower extremity muscle tone and bulk is good he goes from seated to standing without difficulty.  He has good balance and gait.  Labs  No results found for: "HGBA1C" Lab Results  Component Value Date   LDLCALC 73 03/05/2020   CREATININE 0.91 03/05/2020     -------------------------------------------------------------------------------------------------------------------- Lab Results  Component Value Date   WBC 5.1 03/05/2020   HGB 10.6 (L) 03/05/2020   HCT 34.1 (L) 03/05/2020   PLT 345.0 03/05/2020   GLUCOSE 76 03/05/2020   CHOL 149 03/05/2020   TRIG 121.0 03/05/2020   HDL 51.90 03/05/2020   LDLCALC 73 03/05/2020   ALT 19 03/05/2020   AST 17 03/05/2020   NA 135 03/05/2020   K 4.4 03/05/2020   CL 105 03/05/2020   CREATININE 0.91 03/05/2020   BUN 23 03/05/2020   CO2 27 03/05/2020    --------------------------------------------------------------------------------------------------------------------- DG C-Arm 1-60 Min-No Report  Result Date: 05/08/2018 Fluoroscopy was utilized by the requesting physician.  No radiographic interpretation.     Assessment & Plan:   There are no diagnoses linked to this encounter.      ----------------------------------------------------------------------------------------------------------------------  Problem List Items Addressed This Visit   None     ----------------------------------------------------------------------------------------------------------------------  There are no diagnoses linked to this encounter.   ----------------------------------------------------------------------------------------------------------------------  I am having Stephen Wise "Stephen Wise" start on HYDROcodone-acetaminophen. I am also having him maintain his mesalamine, amphetamine-dextroamphetamine, ferrous sulfate, pantoprazole, SUMAtriptan, butalbital-apap-caffeine-codeine, butalbital-acetaminophen-caffeine, cyclobenzaprine, and HYDROcodone-acetaminophen.   Meds ordered this encounter  Medications   cyclobenzaprine (FLEXERIL) 10 MG tablet    Sig: Take 1 tablet (10 mg total) by mouth 2 (two) times daily as needed for muscle spasms.    Dispense:  60 tablet    Refill:  3    HYDROcodone-acetaminophen (NORCO/VICODIN) 5-325 MG tablet    Sig: Take 1 tablet by mouth every 6 (six) hours as needed for moderate pain or severe pain.    Dispense:  90 tablet    Refill:  0   HYDROcodone-acetaminophen (NORCO/VICODIN) 5-325 MG tablet    Sig: Take 1 tablet by mouth every 6 (six) hours as needed for moderate pain or severe pain.    Dispense:  90 tablet    Refill:  0   Patient's Medications  New Prescriptions   HYDROCODONE-ACETAMINOPHEN (NORCO/VICODIN) 5-325 MG TABLET    Take 1 tablet by mouth every 6 (six) hours as needed for moderate pain or severe pain.  Previous Medications   AMPHETAMINE-DEXTROAMPHETAMINE (ADDERALL XR) 20 MG 24 HR CAPSULE    Take 1 capsule (20 mg total) by mouth every morning.   BUTALBITAL-ACETAMINOPHEN-CAFFEINE (FIORICET) 50-325-40 MG TABLET    Take 1-2 tablets by mouth every 6 (six) hours as needed for headache.   BUTALBITAL-APAP-CAFFEINE-CODEINE (FIORICET WITH CODEINE) 50-325-40-30 MG CAPSULE    Take 1 capsule by mouth daily as needed.   FERROUS SULFATE 325 (65 FE) MG TABLET    Take 325 mg by mouth daily with breakfast.   MESALAMINE (PENTASA) 500 MG CR CAPSULE    Take 1,000 mg by mouth 3 (three) times daily as needed.    PANTOPRAZOLE (PROTONIX) 40 MG TABLET    Take 40 mg by mouth daily.   SUMATRIPTAN (IMITREX) 100 MG TABLET    May repeat in 2 hours  if headache persists or recurs.  Modified Medications   Modified Medication Previous Medication   CYCLOBENZAPRINE (FLEXERIL) 10 MG TABLET cyclobenzaprine (FLEXERIL) 10 MG tablet      Take 1 tablet (10 mg total) by mouth 2 (two) times daily as needed for muscle spasms.    Take 1 tablet (10 mg total) by mouth 2 (two) times daily as needed for muscle spasms.   HYDROCODONE-ACETAMINOPHEN (NORCO/VICODIN) 5-325 MG TABLET HYDROcodone-acetaminophen (NORCO/VICODIN) 5-325 MG tablet      Take 1 tablet by mouth every 6 (six) hours as needed for moderate pain or severe pain.    Take 1 tablet by mouth every 6 (six) hours as  needed for moderate pain or severe pain.  Discontinued Medications   No medications on file   ----------------------------------------------------------------------------------------------------------------------  Follow-up: Return in about 2 months (around 04/11/2023) for evaluation, med refill.  1. Lumbosacral spondylosis without myelopathy   2. Chronic pain syndrome   3. Long term current use of opiate analgesic   4. Sciatica of left side   5. Facet arthritis of lumbar region   6. Neck pain   Based on his current circumstances think is very reasonable to continue with chronic opioid maintenance therapy which has worked very well for him and is without side effect.  He is due for prescription refill on May 15 and June 14.  No other changes in his pharmacologic regimen will be initiated.  I will refill his Flexeril for his muscle spasms.  I talked to him about possible stretching exercises that might help with the left lower quadrant low back spasming.  We will schedule him for follow-up in approximately 2 months.  He can also utilize a TENS unit for application.  I have offered him possible epidural steroid injections but he would rather try some of these other therapies as he feels that he is not responded to interventional therapy in the past.  Yevette Edwards, MD

## 2023-02-09 NOTE — Progress Notes (Signed)
Nursing Pain Medication Assessment:  Safety precautions to be maintained throughout the outpatient stay will include: orient to surroundings, keep bed in low position, maintain call bell within reach at all times, provide assistance with transfer out of bed and ambulation.  Medication Inspection Compliance: Pill count conducted under aseptic conditions, in front of the patient. Neither the pills nor the bottle was removed from the patient's sight at any time. Once count was completed pills were immediately returned to the patient in their original bottle.  Medication: Hydrocodone/APAP Pill/Patch Count:  23 of 90 pills remain Pill/Patch Appearance: Markings consistent with prescribed medication Bottle Appearance: Standard pharmacy container. Clearly labeled. Filled Date: unknown, patient did not bring the most recent pill bottle Last Medication intake:  TodaySafety precautions to be maintained throughout the outpatient stay will include: orient to surroundings, keep bed in low position, maintain call bell within reach at all times, provide assistance with transfer out of bed and ambulation.

## 2023-02-10 ENCOUNTER — Telehealth: Payer: Self-pay

## 2023-02-10 NOTE — Transitions of Care (Post Inpatient/ED Visit) (Unsigned)
   02/10/2023  Name: Stephen Wise MRN: [id number] DOB: 1971-03-20  Today's TOC FU Call Status: Today's TOC FU Call Status:: Unsuccessul Call (1st Attempt) Unsuccessful Call (1st Attempt) Date: 02/10/23  Attempted to reach the patient regarding the most recent Inpatient/ED visit.  Follow Up Plan: Additional outreach attempts will be made to reach the patient to complete the Transitions of Care (Post Inpatient/ED visit) call.   Signature Karena Addison, LPN [facility] Nurse Health Advisor Direct Dial [phone]

## 2023-02-13 NOTE — Transitions of Care (Post Inpatient/ED Visit) (Unsigned)
   02/13/2023  Name: Stephen Wise MRN: [id number] DOB: March 03, 1971  Today's TOC FU Call Status: Today's TOC FU Call Status:: Unsuccessul Call (1st Attempt) Unsuccessful Call (1st Attempt) Date: 02/10/23  Attempted to reach the patient regarding the most recent Inpatient/ED visit.  Follow Up Plan: Additional outreach attempts will be made to reach the patient to complete the Transitions of Care (Post Inpatient/ED visit) call.   Signature Karena Addison, LPN [facility] Nurse Health Advisor Direct Dial [phone]

## 2023-02-14 LAB — TOXASSURE SELECT 13 (MW), URINE

## 2023-02-14 NOTE — Transitions of Care (Post Inpatient/ED Visit) (Signed)
   02/14/2023  Name: Stephen Wise MRN: [id number] DOB: 10-10-1970  Today's TOC FU Call Status: Today's TOC FU Call Status:: Unsuccessful Call (3rd Attempt) Unsuccessful Call (1st Attempt) Date: 02/10/23 Unsuccessful Call (3rd Attempt) Date: 02/14/23  Attempted to reach the patient regarding the most recent Inpatient/ED visit.  Follow Up Plan: No further outreach attempts will be made at this time. We have been unable to contact the patient.  Signature Karena Addison, LPN [facility] Nurse Health Advisor Direct Dial [phone]

## 2023-04-12 ENCOUNTER — Encounter: Payer: Self-pay | Admitting: Anesthesiology

## 2023-04-12 ENCOUNTER — Ambulatory Visit: Payer: Self-pay | Attending: Anesthesiology | Admitting: Anesthesiology

## 2023-04-12 DIAGNOSIS — Z79891 Long term (current) use of opiate analgesic: Secondary | ICD-10-CM

## 2023-04-12 DIAGNOSIS — M542 Cervicalgia: Secondary | ICD-10-CM

## 2023-04-12 DIAGNOSIS — M4316 Spondylolisthesis, lumbar region: Secondary | ICD-10-CM

## 2023-04-12 DIAGNOSIS — M5137 Other intervertebral disc degeneration, lumbosacral region: Secondary | ICD-10-CM

## 2023-04-12 DIAGNOSIS — M47817 Spondylosis without myelopathy or radiculopathy, lumbosacral region: Secondary | ICD-10-CM

## 2023-04-12 DIAGNOSIS — M47816 Spondylosis without myelopathy or radiculopathy, lumbar region: Secondary | ICD-10-CM

## 2023-04-12 DIAGNOSIS — M5432 Sciatica, left side: Secondary | ICD-10-CM

## 2023-04-12 DIAGNOSIS — G43709 Chronic migraine without aura, not intractable, without status migrainosus: Secondary | ICD-10-CM

## 2023-04-12 DIAGNOSIS — G894 Chronic pain syndrome: Secondary | ICD-10-CM

## 2023-04-12 MED ORDER — HYDROCODONE-ACETAMINOPHEN 5-325 MG PO TABS: 1.0000 | ORAL_TABLET | Freq: Four times a day (QID) | ORAL | 0 refills | Status: AC | PRN

## 2023-04-12 MED ORDER — HYDROCODONE-ACETAMINOPHEN 5-325 MG PO TABS: 1.0000 | ORAL_TABLET | Freq: Four times a day (QID) | ORAL | 0 refills | Status: DC | PRN

## 2023-04-12 NOTE — Progress Notes (Signed)
Virtual Visit via Telephone Note  I connected with Stephen Wise on 04/12/23 at  1:00 PM EDT by telephone and verified that I am speaking with the correct person using two identifiers.  Location: Patient: Home Provider: [facility]   I discussed the limitations, risks, security and privacy concerns of performing an evaluation and management service by telephone and the availability of in person appointments. I also discussed with the patient that there may be a patient responsible charge related to this service. The patient expressed understanding and agreed to proceed.   History of Present Illness: I spoke with Stephen Wise via telephone for her virtual appointment.  We were unable to utilize video portion of the conference.  He reports that he is doing well.  He staying active and exercising regularly.  No skin changes are noted in the quality characteristic or distribution of of his low back pain or occasional neck pain.  He continues to take hydrocodone 3 times a day successfully with good relief.  The medications continue to enable him to stay active functional and sleep better at night.  No side effects are reported.  He continues to do his stretching and physical therapy exercises and does have occasional bouts of significant pain secondary to daily activity but the medications are working to relieve this.  He generally gets about 75% relief lasting about 6 hours before he has recurrence of his baseline pain.  Otherwise he is in his usual state of health.  No changes in bowel bladder or lower extremity strength or function are noted.  Review of systems: General: No fevers or chills Pulmonary: No shortness of breath or dyspnea Cardiac: No angina or palpitations or lightheadedness GI: No abdominal pain or constipation Psych: No depression    Observations/Objective:   Current Outpatient Medications:    [START ON 05/17/2023] HYDROcodone-acetaminophen (NORCO/VICODIN) 5-325 MG tablet,  Take 1 tablet by mouth every 6 (six) hours as needed for moderate pain or severe pain., Disp: 90 tablet, Rfl: 0   amphetamine-dextroamphetamine (ADDERALL XR) 20 MG 24 hr capsule, Take 1 capsule (20 mg total) by mouth every morning., Disp: 30 capsule, Rfl: 0   butalbital-acetaminophen-caffeine (FIORICET) 50-325-40 MG tablet, Take 1-2 tablets by mouth every 6 (six) hours as needed for headache., Disp: 20 tablet, Rfl: 0   butalbital-apap-caffeine-codeine (FIORICET WITH CODEINE) 50-325-40-30 MG capsule, Take 1 capsule by mouth daily as needed., Disp: , Rfl:    cyclobenzaprine (FLEXERIL) 10 MG tablet, Take 1 tablet (10 mg total) by mouth 2 (two) times daily as needed for muscle spasms., Disp: 60 tablet, Rfl: 3   ferrous sulfate 325 (65 FE) MG tablet, Take 325 mg by mouth daily with breakfast., Disp: , Rfl:    [START ON 04/14/2023] HYDROcodone-acetaminophen (NORCO/VICODIN) 5-325 MG tablet, Take 1 tablet by mouth every 6 (six) hours as needed for moderate pain or severe pain., Disp: 90 tablet, Rfl: 0   mesalamine (PENTASA) 500 MG CR capsule, Take 1,000 mg by mouth 3 (three) times daily as needed. , Disp: , Rfl:    pantoprazole (PROTONIX) 40 MG tablet, Take 40 mg by mouth daily., Disp: , Rfl:    SUMAtriptan (IMITREX) 100 MG tablet, May repeat in 2 hours if headache persists or recurs., Disp: 9 tablet, Rfl: 4 Past Medical History:  Diagnosis Date   Adult ADHD    Chronic headaches    Chronic pain    Chronic prostatitis    Condyloma acuminatum of penis    Crohn disease (HCC)  with terminal ileitis   ED (erectile dysfunction)    Gastric ulcer    Inflammatory bowel disease    Microcytic anemia    Migraines 10/01/2019   Peyronie's disease     Assessment and Plan:  1. Lumbosacral spondylosis without myelopathy   2. Chronic pain syndrome   3. Long term current use of opiate analgesic   4. Sciatica of left side   5. Facet arthritis of lumbar region   6. Neck pain   7. Spondylolisthesis of lumbar  region   8. Chronic migraine without aura without status migrainosus, not intractable   9. Disc disease, degenerative, lumbar or lumbosacral    Based on discussion and upon review of the [facility] practitioner database information I think it is appropriate to refill his medications for the next 2 months.  This will be for July 12 which will be 3 days early secondary to some travel plans and August 14.  This to be for 90 tablets.  Continue current therapy with no other changes made.  Continue stretching exercises as reviewed today and return to clinic in 2 months.  Continue follow-up with his primary care physician for baseline medical care. Follow Up Instructions:    I discussed the assessment and treatment plan with the patient. The patient was provided an opportunity to ask questions and all were answered. The patient agreed with the plan and demonstrated an understanding of the instructions.   The patient was advised to call back or seek an in-person evaluation if the symptoms worsen or if the condition fails to improve as anticipated.  I provided 30 minutes of non-face-to-face time during this encounter.   Yevette Edwards, MD

## 2023-06-14 ENCOUNTER — Encounter: Payer: Self-pay | Admitting: Anesthesiology

## 2023-06-14 ENCOUNTER — Ambulatory Visit: Payer: Self-pay | Attending: Anesthesiology | Admitting: Anesthesiology

## 2023-06-14 DIAGNOSIS — M47816 Spondylosis without myelopathy or radiculopathy, lumbar region: Secondary | ICD-10-CM

## 2023-06-14 DIAGNOSIS — M545 Low back pain, unspecified: Secondary | ICD-10-CM

## 2023-06-14 DIAGNOSIS — M47817 Spondylosis without myelopathy or radiculopathy, lumbosacral region: Secondary | ICD-10-CM

## 2023-06-14 DIAGNOSIS — M5432 Sciatica, left side: Secondary | ICD-10-CM

## 2023-06-14 DIAGNOSIS — M5137 Other intervertebral disc degeneration, lumbosacral region: Secondary | ICD-10-CM

## 2023-06-14 DIAGNOSIS — G8929 Other chronic pain: Secondary | ICD-10-CM

## 2023-06-14 DIAGNOSIS — M542 Cervicalgia: Secondary | ICD-10-CM

## 2023-06-14 DIAGNOSIS — M4316 Spondylolisthesis, lumbar region: Secondary | ICD-10-CM

## 2023-06-14 DIAGNOSIS — Z79891 Long term (current) use of opiate analgesic: Secondary | ICD-10-CM

## 2023-06-14 DIAGNOSIS — M51379 Other intervertebral disc degeneration, lumbosacral region without mention of lumbar back pain or lower extremity pain: Secondary | ICD-10-CM

## 2023-06-14 DIAGNOSIS — G894 Chronic pain syndrome: Secondary | ICD-10-CM

## 2023-06-14 DIAGNOSIS — G43709 Chronic migraine without aura, not intractable, without status migrainosus: Secondary | ICD-10-CM

## 2023-06-14 MED ORDER — HYDROCODONE-ACETAMINOPHEN 5-325 MG PO TABS: 1.0000 | ORAL_TABLET | Freq: Four times a day (QID) | ORAL | 0 refills | Status: AC | PRN

## 2023-06-14 NOTE — Progress Notes (Signed)
Virtual Visit via Telephone Note  I connected with Stephen Wise on 06/14/23 at 10:00 AM EDT by telephone and verified that I am speaking with the correct person using two identifiers.  Location: Patient: Home Provider: Pain control center   I discussed the limitations, risks, security and privacy concerns of performing an evaluation and management service by telephone and the availability of in person appointments. I also discussed with the patient that there may be a patient responsible charge related to this service. The patient expressed understanding and agreed to proceed.   History of Present Illness: I spoke with Stephen Wise via telephone as we were unable to link for the video portion of the conference and he reports that his back has been doing reasonably well.  He still gets periodic spasming and pain that is comparable to what he has had chronically for the past several years.  He gets some sciatica symptoms especially when he has been working longer shifts and staying on concrete floors doing occasional lifting but this is generally tolerable.  He is doing his stretching strengthening exercises and trying to keep his core strength up.  He takes his hydrocodone approximately 3 times a day and this continues to give him goodRelief whereas he has failed more conservative therapy.  No side effects with the medication are noted.  Otherwise he is in his usual state of health with no new changes in lower extremity strength or function bowel or bladder function.  Review of systems: General: No fevers or chills Pulmonary: No shortness of breath or dyspnea Cardiac: No angina or palpitations or lightheadedness GI: No abdominal pain or constipation Psych: No depression  Observations/Objective:  Current Outpatient Medications:    [START ON 07/16/2023] HYDROcodone-acetaminophen (NORCO/VICODIN) 5-325 MG tablet, Take 1 tablet by mouth every 6 (six) hours as needed for moderate pain or severe pain., Disp:  90 tablet, Rfl: 0   amphetamine-dextroamphetamine (ADDERALL XR) 20 MG 24 hr capsule, Take 1 capsule (20 mg total) by mouth every morning., Disp: 30 capsule, Rfl: 0   butalbital-acetaminophen-caffeine (FIORICET) 50-325-40 MG tablet, Take 1-2 tablets by mouth every 6 (six) hours as needed for headache., Disp: 20 tablet, Rfl: 0   butalbital-apap-caffeine-codeine (FIORICET WITH CODEINE) 50-325-40-30 MG capsule, Take 1 capsule by mouth daily as needed., Disp: , Rfl:    ferrous sulfate 325 (65 FE) MG tablet, Take 325 mg by mouth daily with breakfast., Disp: , Rfl:    [START ON 06/16/2023] HYDROcodone-acetaminophen (NORCO/VICODIN) 5-325 MG tablet, Take 1 tablet by mouth every 6 (six) hours as needed for moderate pain or severe pain., Disp: 90 tablet, Rfl: 0   mesalamine (PENTASA) 500 MG CR capsule, Take 1,000 mg by mouth 3 (three) times daily as needed. , Disp: , Rfl:    pantoprazole (PROTONIX) 40 MG tablet, Take 40 mg by mouth daily., Disp: , Rfl:    SUMAtriptan (IMITREX) 100 MG tablet, May repeat in 2 hours if headache persists or recurs., Disp: 9 tablet, Rfl: 4   Past Medical History:  Diagnosis Date   Adult ADHD    Chronic headaches    Chronic pain    Chronic prostatitis    Condyloma acuminatum of penis    Crohn disease (HCC)    with terminal ileitis   ED (erectile dysfunction)    Gastric ulcer    Inflammatory bowel disease    Microcytic anemia    Migraines 10/01/2019   Peyronie's disease      Assessment and Plan: 1. Lumbosacral spondylosis without myelopathy  2. Chronic pain syndrome   3. Long term current use of opiate analgesic   4. Sciatica of left side   5. Facet arthritis of lumbar region   6. Neck pain   7. Spondylolisthesis of lumbar region   8. Chronic migraine without aura without status migrainosus, not intractable   9. Disc disease, degenerative, lumbar or lumbosacral   10. Chronic left-sided low back pain without sciatica   Based on our conversation and after review of  the [facility] practitioner database information is appropriate to refill his medications for the next 2 months.  This be dated for September 13 and October 13.  No change in his pharmacologic regimen will be initiated.  Continue core stretching strengthening exercises as he is now doing with return to clinic scheduled in 2 months.  Continue follow-up with his primary care physicians for baseline medical care.  Follow Up Instructions:    I discussed the assessment and treatment plan with the patient. The patient was provided an opportunity to ask questions and all were answered. The patient agreed with the plan and demonstrated an understanding of the instructions.   The patient was advised to call back or seek an in-person evaluation if the symptoms worsen or if the condition fails to improve as anticipated.  I provided 30 minutes of non-face-to-face time during this encounter.   Yevette Edwards, MD

## 2023-08-17 ENCOUNTER — Ambulatory Visit: Payer: Self-pay | Attending: Anesthesiology | Admitting: Anesthesiology

## 2023-08-17 DIAGNOSIS — G43709 Chronic migraine without aura, not intractable, without status migrainosus: Secondary | ICD-10-CM

## 2023-08-17 DIAGNOSIS — M545 Low back pain, unspecified: Secondary | ICD-10-CM

## 2023-08-17 DIAGNOSIS — M542 Cervicalgia: Secondary | ICD-10-CM

## 2023-08-17 DIAGNOSIS — Z79891 Long term (current) use of opiate analgesic: Secondary | ICD-10-CM

## 2023-08-17 DIAGNOSIS — M47816 Spondylosis without myelopathy or radiculopathy, lumbar region: Secondary | ICD-10-CM

## 2023-08-17 DIAGNOSIS — G8929 Other chronic pain: Secondary | ICD-10-CM

## 2023-08-17 DIAGNOSIS — M47817 Spondylosis without myelopathy or radiculopathy, lumbosacral region: Secondary | ICD-10-CM

## 2023-08-17 DIAGNOSIS — M5432 Sciatica, left side: Secondary | ICD-10-CM

## 2023-08-17 DIAGNOSIS — G894 Chronic pain syndrome: Secondary | ICD-10-CM

## 2023-08-17 DIAGNOSIS — M4316 Spondylolisthesis, lumbar region: Secondary | ICD-10-CM

## 2023-08-17 MED ORDER — HYDROCODONE-ACETAMINOPHEN 5-325 MG PO TABS: 1.0000 | ORAL_TABLET | Freq: Three times a day (TID) | ORAL | 0 refills | Status: DC

## 2023-08-17 MED ORDER — HYDROCODONE-ACETAMINOPHEN 5-325 MG PO TABS: 1.0000 | ORAL_TABLET | Freq: Three times a day (TID) | ORAL | 0 refills | Status: AC

## 2023-08-21 ENCOUNTER — Encounter: Payer: Self-pay | Admitting: Anesthesiology

## 2023-08-21 NOTE — Progress Notes (Signed)
Virtual Visit via Telephone Note  I connected with Stephen Wise on 08/21/23 at  9:40 AM EST by telephone and verified that I am speaking with the correct person using two identifiers.  Location: Patient: Home Provider: Pain control center   I discussed the limitations, risks, security and privacy concerns of performing an evaluation and management service by telephone and the availability of in person appointments. I also discussed with the patient that there may be a patient responsible charge related to this service. The patient expressed understanding and agreed to proceed.   History of Present Illness: I spoke with Stephen Wise via telephone as we are unable link the video portion of the conference.  He reports that he is doing well.  His current medication regimen is working well for him and keeping his pain under good control.  No side effects are reported.  The quality characteristic and distribution of the pain remained stable.  He staying active doing physical therapy and working out and continues to work full-time.  Otherwise he is in his usual state of health.  Review of systems: General: No fevers or chills Pulmonary: No shortness of breath or dyspnea Cardiac: No angina or palpitations or lightheadedness GI: No abdominal pain or constipation Psych: No depression    Observations/Objective:  Current Outpatient Medications:    HYDROcodone-acetaminophen (NORCO/VICODIN) 5-325 MG tablet, Take 1 tablet by mouth 3 (three) times daily., Disp: 90 tablet, Rfl: 0   [START ON 09/16/2023] HYDROcodone-acetaminophen (NORCO/VICODIN) 5-325 MG tablet, Take 1 tablet by mouth 3 (three) times daily., Disp: 90 tablet, Rfl: 0   amphetamine-dextroamphetamine (ADDERALL XR) 20 MG 24 hr capsule, Take 1 capsule (20 mg total) by mouth every morning., Disp: 30 capsule, Rfl: 0   butalbital-acetaminophen-caffeine (FIORICET) 50-325-40 MG tablet, Take 1-2 tablets by mouth every 6 (six) hours as needed for  headache., Disp: 20 tablet, Rfl: 0   butalbital-apap-caffeine-codeine (FIORICET WITH CODEINE) 50-325-40-30 MG capsule, Take 1 capsule by mouth daily as needed., Disp: , Rfl:    ferrous sulfate 325 (65 FE) MG tablet, Take 325 mg by mouth daily with breakfast., Disp: , Rfl:    mesalamine (PENTASA) 500 MG CR capsule, Take 1,000 mg by mouth 3 (three) times daily as needed. , Disp: , Rfl:    pantoprazole (PROTONIX) 40 MG tablet, Take 40 mg by mouth daily., Disp: , Rfl:    SUMAtriptan (IMITREX) 100 MG tablet, May repeat in 2 hours if headache persists or recurs., Disp: 9 tablet, Rfl: 4  Past Medical History:  Diagnosis Date   Adult ADHD    Chronic headaches    Chronic pain    Chronic prostatitis    Condyloma acuminatum of penis    Crohn disease (HCC)    with terminal ileitis   ED (erectile dysfunction)    Gastric ulcer    Inflammatory bowel disease    Microcytic anemia    Migraines 10/01/2019   Peyronie's disease     Assessment and Plan: 1. Lumbosacral spondylosis without myelopathy   2. Chronic pain syndrome   3. Long term current use of opiate analgesic   4. Sciatica of left side   5. Facet arthritis of lumbar region   6. Neck pain   7. Spondylolisthesis of lumbar region   8. Chronic migraine without aura without status migrainosus, not intractable   9. Chronic left-sided low back pain without sciatica    Based on conversation think it is appropriate to refill his medicines for the next 2 months dated for  November 14 and December 14.  No other changes in his regimen will be initiated.  Continue follow-up with his primary care physicians for baseline medical care with schedule return to clinic in 2 months.  Follow Up Instructions:    I discussed the assessment and treatment plan with the patient. The patient was provided an opportunity to ask questions and all were answered. The patient agreed with the plan and demonstrated an understanding of the instructions.   The patient was  advised to call back or seek an in-person evaluation if the symptoms worsen or if the condition fails to improve as anticipated.  I provided 30 minutes of non-face-to-face time during this encounter.   Yevette Edwards, MD

## 2023-10-18 ENCOUNTER — Encounter: Payer: Self-pay | Admitting: Anesthesiology

## 2023-10-18 ENCOUNTER — Ambulatory Visit: Payer: Self-pay | Attending: Anesthesiology | Admitting: Anesthesiology

## 2023-10-18 DIAGNOSIS — G894 Chronic pain syndrome: Secondary | ICD-10-CM

## 2023-10-18 DIAGNOSIS — M47816 Spondylosis without myelopathy or radiculopathy, lumbar region: Secondary | ICD-10-CM

## 2023-10-18 DIAGNOSIS — G43709 Chronic migraine without aura, not intractable, without status migrainosus: Secondary | ICD-10-CM

## 2023-10-18 DIAGNOSIS — M5432 Sciatica, left side: Secondary | ICD-10-CM

## 2023-10-18 DIAGNOSIS — M4316 Spondylolisthesis, lumbar region: Secondary | ICD-10-CM

## 2023-10-18 DIAGNOSIS — G8929 Other chronic pain: Secondary | ICD-10-CM

## 2023-10-18 DIAGNOSIS — Z79891 Long term (current) use of opiate analgesic: Secondary | ICD-10-CM

## 2023-10-18 DIAGNOSIS — M47817 Spondylosis without myelopathy or radiculopathy, lumbosacral region: Secondary | ICD-10-CM

## 2023-10-18 MED ORDER — HYDROCODONE-ACETAMINOPHEN 5-325 MG PO TABS: 1.0000 | ORAL_TABLET | Freq: Three times a day (TID) | ORAL | 0 refills | Status: DC

## 2023-10-18 MED ORDER — HYDROCODONE-ACETAMINOPHEN 5-325 MG PO TABS: 1.0000 | ORAL_TABLET | Freq: Three times a day (TID) | ORAL | 0 refills | Status: AC

## 2023-10-18 MED ORDER — BUTALBITAL-APAP-CAFF-COD 50-325-40-30 MG PO CAPS: 1.0000 | ORAL_CAPSULE | Freq: Every day | ORAL | 0 refills | Status: DC | PRN

## 2023-10-18 NOTE — Progress Notes (Signed)
 Virtual Visit via Telephone Note  I connected with Stephen Wise on 10/18/23 at  3:45 PM EST by telephone and verified that I am speaking with the correct person using two identifiers.  Location: Patient: Home Provider: Pain control center   I discussed the limitations, risks, security and privacy concerns of performing an evaluation and management service by telephone and the availability of in person appointments. I also discussed with the patient that there may be a patient responsible charge related to this service. The patient expressed understanding and agreed to proceed.   History of Present Illness: I spoke with Stephen Wise via telephone as we are unable link for the video portion of the conference but he reports that he is doing well.  His low back pain is under decent control with stretching physical therapy and core strengthening.  The quality characteristic and distribution of the pain he does experiences is stable.  He is taking his medication 3 times a day.  He knows that certain activities at work with heavy lifting are problematic but with medication management he is able to stay functional and active.  He denies any sedation or side effects with the medication.  Otherwise he is in his usual state of health.  No change in lower extremity strength function bowel or bladder function is noted.  Review of systems: General: No fevers or chills Pulmonary: No shortness of breath or dyspnea Cardiac: No angina or palpitations or lightheadedness GI: No abdominal pain or constipation Psych: No depression  Observations/Objective:  Current Outpatient Medications:    [START ON 11/17/2023] HYDROcodone -acetaminophen  (NORCO/VICODIN) 5-325 MG tablet, Take 1 tablet by mouth in the morning, at noon, and at bedtime., Disp: 90 tablet, Rfl: 0   amphetamine -dextroamphetamine  (ADDERALL XR) 20 MG 24 hr capsule, Take 1 capsule (20 mg total) by mouth every morning., Disp: 30 capsule, Rfl: 0    butalbital -apap-caffeine -codeine (FIORICET WITH CODEINE) 50-325-40-30 MG capsule, Take 1 capsule by mouth daily as needed., Disp: 20 capsule, Rfl: 0   ferrous sulfate 325 (65 FE) MG tablet, Take 325 mg by mouth daily with breakfast., Disp: , Rfl:    HYDROcodone -acetaminophen  (NORCO/VICODIN) 5-325 MG tablet, Take 1 tablet by mouth 3 (three) times daily., Disp: 90 tablet, Rfl: 0   mesalamine (PENTASA) 500 MG CR capsule, Take 1,000 mg by mouth 3 (three) times daily as needed. , Disp: , Rfl:    pantoprazole  (PROTONIX ) 40 MG tablet, Take 40 mg by mouth daily., Disp: , Rfl:    SUMAtriptan  (IMITREX ) 100 MG tablet, May repeat in 2 hours if headache persists or recurs., Disp: 9 tablet, Rfl: 4  Past Medical History:  Diagnosis Date   Adult ADHD    Chronic headaches    Chronic pain    Chronic prostatitis    Condyloma acuminatum of penis    Crohn disease (HCC)    with terminal ileitis   ED (erectile dysfunction)    Gastric ulcer    Inflammatory bowel disease    Microcytic anemia    Migraines 10/01/2019   Peyronie's disease     Assessment and Plan: 1. Lumbosacral spondylosis without myelopathy   2. Chronic pain syndrome   3. Long term current use of opiate analgesic   4. Sciatica of left side   5. Facet arthritis of lumbar region   6. Spondylolisthesis of lumbar region   7. Chronic migraine without aura without status migrainosus, not intractable   8. Chronic left-sided low back pain without sciatica    Based on conversation  I gets appropriate to refill his medications for January 15 and February 14.  I have reviewed the [place]  practitioner database information is appropriate.  He is doing well with this regimen and staying functionally active and able to fulfill work related activities with the opioid medications whereas he was limited and incapacitated secondary to the low back pain prior to this.  He is also having some tension headaches and intermittent migraines and has used  butalbital  for these in the past and I will refill this for as needed use.  I have forewarned him that should any change in the migrainous component of the headaches present that he needs to follow-up with his primary care physicians for further evaluation.  Continue follow-up with his primary care physicians for baseline medical care with return to clinic scheduled in 2 months.  Follow Up Instructions:    I discussed the assessment and treatment plan with the patient. The patient was provided an opportunity to ask questions and all were answered. The patient agreed with the plan and demonstrated an understanding of the instructions.   The patient was advised to call back or seek an in-person evaluation if the symptoms worsen or if the condition fails to improve as anticipated.  I provided 30 minutes of non-face-to-face time during this encounter.   Zula Hitch, MD

## 2023-12-07 ENCOUNTER — Telehealth: Payer: Self-pay | Admitting: Anesthesiology

## 2023-12-07 NOTE — Telephone Encounter (Signed)
 PT wants to know if Stephen Wise will call in the Butalbital medication patient stated he takes this meds for headache. PT stated that he called in this for him last month. 20 capsule. 325- 40- 30. Please give patient a call. TY

## 2023-12-08 MED ORDER — BUTALBITAL-APAP-CAFF-COD 50-325-40-30 MG PO CAPS: 1.0000 | ORAL_CAPSULE | Freq: Every day | ORAL | 0 refills | Status: DC | PRN

## 2023-12-08 NOTE — Addendum Note (Signed)
 Addended by: Yevette Edwards on: 12/08/2023 02:40 PM   Modules accepted: Orders

## 2023-12-12 ENCOUNTER — Ambulatory Visit: Payer: Self-pay | Attending: Anesthesiology | Admitting: Anesthesiology

## 2023-12-12 DIAGNOSIS — M4316 Spondylolisthesis, lumbar region: Secondary | ICD-10-CM

## 2023-12-12 DIAGNOSIS — M47816 Spondylosis without myelopathy or radiculopathy, lumbar region: Secondary | ICD-10-CM

## 2023-12-12 DIAGNOSIS — Z79891 Long term (current) use of opiate analgesic: Secondary | ICD-10-CM

## 2023-12-12 DIAGNOSIS — G894 Chronic pain syndrome: Secondary | ICD-10-CM

## 2023-12-12 DIAGNOSIS — M5432 Sciatica, left side: Secondary | ICD-10-CM

## 2023-12-12 DIAGNOSIS — M542 Cervicalgia: Secondary | ICD-10-CM

## 2023-12-12 DIAGNOSIS — G43709 Chronic migraine without aura, not intractable, without status migrainosus: Secondary | ICD-10-CM

## 2023-12-12 DIAGNOSIS — M47817 Spondylosis without myelopathy or radiculopathy, lumbosacral region: Secondary | ICD-10-CM

## 2023-12-13 ENCOUNTER — Encounter: Payer: Self-pay | Admitting: Anesthesiology

## 2023-12-13 MED ORDER — HYDROCODONE-ACETAMINOPHEN 5-325 MG PO TABS: 1.0000 | ORAL_TABLET | Freq: Four times a day (QID) | ORAL | 0 refills | Status: DC | PRN

## 2023-12-13 MED ORDER — BUTALBITAL-APAP-CAFF-COD 50-325-40-30 MG PO CAPS: 1.0000 | ORAL_CAPSULE | Freq: Every day | ORAL | 0 refills | Status: AC | PRN

## 2023-12-13 MED ORDER — HYDROCODONE-ACETAMINOPHEN 5-325 MG PO TABS: 1.0000 | ORAL_TABLET | Freq: Three times a day (TID) | ORAL | 0 refills | Status: AC

## 2023-12-13 MED ORDER — AMPHETAMINE-DEXTROAMPHET ER 20 MG PO CP24: 20.0000 mg | ORAL_CAPSULE | ORAL | 0 refills | Status: AC

## 2023-12-13 NOTE — Progress Notes (Signed)
 Virtual Visit via Telephone Note  I connected with Stephen Wise on 12/13/23 at  4:00 PM EDT by telephone and verified that I am speaking with the correct person using two identifiers.  Location: Patient: Home Provider: Pain control center   I discussed the limitations, risks, security and privacy concerns of performing an evaluation and management service by telephone and the availability of in person appointments. I also discussed with the patient that there may be a patient responsible charge related to this service. The patient expressed understanding and agreed to proceed.   History of Present Illness: I spoke with Stephen Wise via telephone today.  He maintains that his low back pain and neck pain are basically stable.  No change in the frequency quality or severity of the pain is noted.  He has been having additional chronic migrainous headaches with intermittent tension component which is in line with his baseline.  His pharmacy has been out of Imitrex recently therefore he has been using more of the Fioricet.  He has had prescriptions for this once or twice over the course of a year but has been using more of this recently secondary to the Imitrex problem.  He maintains that the quality characteristic and distribution of the headaches and frequency of the headaches is comparable with no recent changes.  No focal neurologic changes are noted.  The tension headaches are worse with certain activities and upper body activity.  Otherwise he is in his usual state of health.  He tolerates his medication without side effects otherwise is doing well.  Review of systems: General: No fevers or chills Pulmonary: No shortness of breath or dyspnea Cardiac: No angina or palpitations or lightheadedness GI: No abdominal pain or constipation Psych: No depression    Observations/Objective:  Current Outpatient Medications:    [START ON 01/16/2024] HYDROcodone-acetaminophen (NORCO/VICODIN) 5-325 MG tablet,  Take 1 tablet by mouth every 6 (six) hours as needed for moderate pain (pain score 4-6) or severe pain (pain score 7-10)., Disp: 90 tablet, Rfl: 0   amphetamine-dextroamphetamine (ADDERALL XR) 20 MG 24 hr capsule, Take 1 capsule (20 mg total) by mouth every morning., Disp: 30 capsule, Rfl: 0   [START ON 01/07/2024] butalbital-apap-caffeine-codeine (FIORICET WITH CODEINE) 50-325-40-30 MG capsule, Take 1 capsule by mouth daily as needed., Disp: 20 capsule, Rfl: 0   ferrous sulfate 325 (65 FE) MG tablet, Take 325 mg by mouth daily with breakfast., Disp: , Rfl:    [START ON 12/17/2023] HYDROcodone-acetaminophen (NORCO/VICODIN) 5-325 MG tablet, Take 1 tablet by mouth in the morning, at noon, and at bedtime., Disp: 90 tablet, Rfl: 0   mesalamine (PENTASA) 500 MG CR capsule, Take 1,000 mg by mouth 3 (three) times daily as needed. , Disp: , Rfl:    pantoprazole (PROTONIX) 40 MG tablet, Take 40 mg by mouth daily., Disp: , Rfl:    SUMAtriptan (IMITREX) 100 MG tablet, May repeat in 2 hours if headache persists or recurs., Disp: 9 tablet, Rfl: 4   Past Medical History:  Diagnosis Date   Adult ADHD    Chronic headaches    Chronic pain    Chronic prostatitis    Condyloma acuminatum of penis    Crohn disease (HCC)    with terminal ileitis   ED (erectile dysfunction)    Gastric ulcer    Inflammatory bowel disease    Microcytic anemia    Migraines 10/01/2019   Peyronie's disease    Assessment and Plan:  1. Lumbosacral spondylosis without myelopathy  2. Chronic pain syndrome   3. Long term current use of opiate analgesic   4. Sciatica of left side   5. Facet arthritis of lumbar region   6. Spondylolisthesis of lumbar region   7. Chronic migraine without aura without status migrainosus, not intractable   8. Neck pain    Based on our conversation today I think is appropriate to refill his medicines for the next 2 months for his Vicodin.  He is using this appropriate and getting good effect from the  medication.  Refills were generated from March 16 April 15.  No other changes in his pharmacologic regimen will be initiated.  In regards to his headaches, it sounds as if the frequency of these is stable in nature however in the past he was able to use Imitrex but his pharmacy is out of this.  We have prescribed butalbital for rescue migrainous headaches however I have cautioned against using this and more frequent capacity secondary to the above.  We will generate a repeat refill for April and then hold off for any other refills till sometime after summer.  He knows to use this sparingly and will touch base with neurology should there be any change in his headaches for further management.  Should he need a refill for the Imitrex at a different pharmacy I have volunteered to assist with that as a one-time option.  Additionally, he is aware that we do not treat migraine headaches at the pain control center and that the Imitrex is for that.  The butalbital is also for rescue migraine headaches but is therefore situations where he has a tension component with subsequent migraine.  We will schedule him for return visit in 2 months and he is to continue follow-up with his primary care physicians for baseline medical care. Follow Up Instructions:    I discussed the assessment and treatment plan with the patient. The patient was provided an opportunity to ask questions and all were answered. The patient agreed with the plan and demonstrated an understanding of the instructions.   The patient was advised to call back or seek an in-person evaluation if the symptoms worsen or if the condition fails to improve as anticipated.  I provided 30 minutes of non-face-to-face time during this encounter.   Yevette Edwards, MD

## 2024-02-12 ENCOUNTER — Encounter: Payer: Self-pay | Admitting: Anesthesiology

## 2024-02-12 ENCOUNTER — Ambulatory Visit: Payer: Self-pay | Attending: Anesthesiology | Admitting: Anesthesiology

## 2024-02-12 VITALS — BP 160/83 | HR 79 | Temp 97.2°F | Resp 18 | Ht 70.0 in | Wt 225.0 lb

## 2024-02-12 DIAGNOSIS — M4316 Spondylolisthesis, lumbar region: Secondary | ICD-10-CM

## 2024-02-12 DIAGNOSIS — M47817 Spondylosis without myelopathy or radiculopathy, lumbosacral region: Secondary | ICD-10-CM

## 2024-02-12 DIAGNOSIS — M5431 Sciatica, right side: Secondary | ICD-10-CM

## 2024-02-12 DIAGNOSIS — M5432 Sciatica, left side: Secondary | ICD-10-CM

## 2024-02-12 DIAGNOSIS — M47816 Spondylosis without myelopathy or radiculopathy, lumbar region: Secondary | ICD-10-CM

## 2024-02-12 DIAGNOSIS — Z79891 Long term (current) use of opiate analgesic: Secondary | ICD-10-CM

## 2024-02-12 DIAGNOSIS — M542 Cervicalgia: Secondary | ICD-10-CM

## 2024-02-12 DIAGNOSIS — G43709 Chronic migraine without aura, not intractable, without status migrainosus: Secondary | ICD-10-CM

## 2024-02-12 DIAGNOSIS — G894 Chronic pain syndrome: Secondary | ICD-10-CM

## 2024-02-12 MED ORDER — HYDROCODONE-ACETAMINOPHEN 5-325 MG PO TABS: 1.0000 | ORAL_TABLET | Freq: Four times a day (QID) | ORAL | 0 refills | Status: DC | PRN

## 2024-02-12 MED ORDER — HYDROCODONE-ACETAMINOPHEN 5-325 MG PO TABS: 1.0000 | ORAL_TABLET | Freq: Four times a day (QID) | ORAL | 0 refills | Status: AC | PRN

## 2024-02-12 NOTE — Progress Notes (Signed)
 Subjective:  Patient ID: Stephen Wise, male    DOB: Feb 14, 1971  Age: 53 y.o. MRN: [id number]  CC: Back Pain   Procedure: None  HPI Stephen Wise presents for a return evaluation.  He is having a fair amount of right lower extremity sciatica with some radiation starting from his right lower back down into the right hip and buttock and going across the right anterior thigh.  Generally he has low back pain with sciatica affecting the left side.  That symptom is less pronounced and the right side is more problematic.  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  The pain is keeping him awake at night.  In the past she has had epidural steroids for this that have been very effective at eliminating 80% or so of the back pain and essentially eliminating the sciatica for 2 to 3 months.  He gets recurrence gradually over time.  At present he is doing his daily physical therapy stretching strengthening exercises as described to me today.  These do help with some of the spasming but unfortunately the pain recurs shortly thereafter.  Otherwise he is in his usual state of health with no lower extremity weakness or change in bowel or bladder function.  He has no side effects with the chronic opioid therapy.  Outpatient Medications Prior to Visit  Medication Sig Dispense Refill   amphetamine -dextroamphetamine  (ADDERALL XR) 20 MG 24 hr capsule Take 1 capsule (20 mg total) by mouth every morning. 30 capsule 0   ferrous sulfate 325 (65 FE) MG tablet Take 325 mg by mouth daily with breakfast.     mesalamine (PENTASA) 500 MG CR capsule Take 1,000 mg by mouth 3 (three) times daily as needed.      pantoprazole  (PROTONIX ) 40 MG tablet Take 40 mg by mouth daily.     SUMAtriptan  (IMITREX ) 100 MG tablet May repeat in 2 hours if headache persists or recurs. 9 tablet 4   HYDROcodone -acetaminophen  (NORCO/VICODIN) 5-325 MG tablet Take 1 tablet by mouth every 6  (six) hours as needed for moderate pain (pain score 4-6) or severe pain (pain score 7-10). 90 tablet 0   No facility-administered medications prior to visit.    Review of Systems CNS: No confusion or sedation Cardiac: No angina or palpitations GI: No abdominal pain or constipation Constitutional: No nausea vomiting fevers or chills  Objective:  BP (!) 160/83   Pulse 79   Temp (!) 97.2 F (36.2 C)   Resp 18   Ht 5\' 10"  (1.778 m)   Wt 225 lb (102.1 kg)   SpO2 100%   BMI 32.28 kg/m    BP Readings from Last 3 Encounters:  02/12/24 (!) 160/83  02/09/23 (!) 155/91  02/16/22 (!) 137/92     Wt Readings from Last 3 Encounters:  02/12/24 225 lb (102.1 kg)  02/16/22 220 lb (99.8 kg)  03/05/20 206 lb (93.4 kg)     Physical Exam Pt is alert and oriented PERRL EOMI HEART IS RRR no murmur or rub LCTA no wheezing or rales MUSCULOSKELETAL reveals some paraspinous muscle tenderness in the lumbar spine with a positive straight leg raise at 30 degrees right side 45 degrees left side.  Muscle tone and bulk is good strength is good with no evidence of any change or deficit muscle tone and bulk is good  Labs  No results found for: "HGBA1C" Lab Results  Component Value Date   [facility]  73 03/05/2020   CREATININE 0.91 03/05/2020    -------------------------------------------------------------------------------------------------------------------- Lab Results  Component Value Date   WBC 5.1 03/05/2020   HGB 10.6 (L) 03/05/2020   HCT 34.1 (L) 03/05/2020   PLT 345.0 03/05/2020   GLUCOSE 76 03/05/2020   CHOL 149 03/05/2020   TRIG 121.0 03/05/2020   HDL 51.90 03/05/2020   LDLCALC 73 03/05/2020   ALT 19 03/05/2020   AST 17 03/05/2020   NA 135 03/05/2020   K 4.4 03/05/2020   CL 105 03/05/2020   CREATININE 0.91 03/05/2020   BUN 23 03/05/2020   CO2 27 03/05/2020     --------------------------------------------------------------------------------------------------------------------- DG C-Arm 1-60 Min-No Report Result Date: 05/08/2018 Fluoroscopy was utilized by the requesting physician.  No radiographic interpretation.     Assessment & Plan:   Stephen "Stephen Wise" was seen today for back pain.  Diagnoses and all orders for this visit:  Sciatica of left side  Lumbosacral spondylosis without myelopathy  Chronic pain syndrome -     ToxASSURE Select 13 (MW), Urine -     Lumbar Epidural Injection; Future  Long term current use of opiate analgesic -     ToxASSURE Select 13 (MW), Urine  Facet arthritis of lumbar region  Spondylolisthesis of lumbar region -     Lumbar Epidural Injection; Future  Chronic migraine without aura without status migrainosus, not intractable  Neck pain  Sciatica of right side -     Lumbar Epidural Injection; Future  Other orders -     HYDROcodone -acetaminophen  (NORCO/VICODIN) 5-325 MG tablet; Take 1 tablet by mouth every 6 (six) hours as needed for moderate pain (pain score 4-6) or severe pain (pain score 7-10). -     HYDROcodone -acetaminophen  (NORCO/VICODIN) 5-325 MG tablet; Take 1 tablet by mouth every 6 (six) hours as needed for moderate pain (pain score 4-6) or severe pain (pain score 7-10).        ----------------------------------------------------------------------------------------------------------------------  Problem List Items Addressed This Visit       Unprioritized   Chronic pain   Relevant Medications   HYDROcodone -acetaminophen  (NORCO/VICODIN) 5-325 MG tablet   HYDROcodone -acetaminophen  (NORCO/VICODIN) 5-325 MG tablet (Start on 03/13/2024)   Other Relevant Orders   ToxASSURE Select 13 (MW), Urine   Lumbar Epidural Injection   Migraines   Relevant Medications   HYDROcodone -acetaminophen  (NORCO/VICODIN) 5-325 MG tablet   HYDROcodone -acetaminophen  (NORCO/VICODIN) 5-325 MG tablet (Start  on 03/13/2024)   Sciatica of right side   Relevant Orders   Lumbar Epidural Injection   Other Visit Diagnoses       Sciatica of left side    -  Primary     Lumbosacral spondylosis without myelopathy         Long term current use of opiate analgesic       Relevant Orders   ToxASSURE Select 13 (MW), Urine     Facet arthritis of lumbar region       Relevant Medications   HYDROcodone -acetaminophen  (NORCO/VICODIN) 5-325 MG tablet   HYDROcodone -acetaminophen  (NORCO/VICODIN) 5-325 MG tablet (Start on 03/13/2024)     Spondylolisthesis of lumbar region       Relevant Orders   Lumbar Epidural Injection     Neck pain             ----------------------------------------------------------------------------------------------------------------------  1. Sciatica of left side (Primary) Will plan for an epidural steroid in the next several weeks once cleared by insurance and contingent to how he does over the next several weeks as this has been present for several weeks.  He has done very well with epidural steroids in the past and they enable him to stay active functional and keep his medication management under control.  Unfortunately, routine physical therapy has been ineffective and other more conservative approaches have been ineffective.  2. Lumbosacral spondylosis without myelopathy As above  3. Chronic pain syndrome I have reviewed the [place]  practitioner database information is appropriate for refill today.  He is a couple days early but secondary to the increased pain he has been using about 3 to 4 tablets/day which is caused him to be short by 2 days - ToxASSURE Select 13 (MW), Urine - Lumbar Epidural Injection; Future  4. Long term current use of opiate analgesic Continue same - ToxASSURE Select 13 (MW), Urine  5. Facet arthritis of lumbar region Continue core stretching strengthening exercises as reviewed today  6. Spondylolisthesis of lumbar region As above - Lumbar  Epidural Injection; Future  7. Chronic migraine without aura without status migrainosus, not intractable Continue current management and follow-up with his primary care physicians.  The quality characteristic and distribution of his migraines and associated symptoms are stable without recent change.  8. Neck pain Continue cervical stretching exercises  9. Sciatica of right side As above - Lumbar Epidural Injection; Future    ----------------------------------------------------------------------------------------------------------------------  I am having Geralynn Knife "Geralynn Knife" start on HYDROcodone -acetaminophen . I am also having him maintain his mesalamine, ferrous sulfate, pantoprazole , SUMAtriptan , amphetamine -dextroamphetamine , and HYDROcodone -acetaminophen .   Meds ordered this encounter  Medications   HYDROcodone -acetaminophen  (NORCO/VICODIN) 5-325 MG tablet    Sig: Take 1 tablet by mouth every 6 (six) hours as needed for moderate pain (pain score 4-6) or severe pain (pain score 7-10).    Dispense:  90 tablet    Refill:  0   HYDROcodone -acetaminophen  (NORCO/VICODIN) 5-325 MG tablet    Sig: Take 1 tablet by mouth every 6 (six) hours as needed for moderate pain (pain score 4-6) or severe pain (pain score 7-10).    Dispense:  90 tablet    Refill:  0   Patient's Medications  New Prescriptions   HYDROCODONE -ACETAMINOPHEN  (NORCO/VICODIN) 5-325 MG TABLET    Take 1 tablet by mouth every 6 (six) hours as needed for moderate pain (pain score 4-6) or severe pain (pain score 7-10).  Previous Medications   AMPHETAMINE -DEXTROAMPHETAMINE  (ADDERALL XR) 20 MG 24 HR CAPSULE    Take 1 capsule (20 mg total) by mouth every morning.   FERROUS SULFATE 325 (65 FE) MG TABLET    Take 325 mg by mouth daily with breakfast.   MESALAMINE (PENTASA) 500 MG CR CAPSULE    Take 1,000 mg by mouth 3 (three) times daily as needed.    PANTOPRAZOLE  (PROTONIX ) 40 MG TABLET    Take 40 mg by mouth daily.    SUMATRIPTAN  (IMITREX ) 100 MG TABLET    May repeat in 2 hours if headache persists or recurs.  Modified Medications   Modified Medication Previous Medication   HYDROCODONE -ACETAMINOPHEN  (NORCO/VICODIN) 5-325 MG TABLET HYDROcodone -acetaminophen  (NORCO/VICODIN) 5-325 MG tablet      Take 1 tablet by mouth every 6 (six) hours as needed for moderate pain (pain score 4-6) or severe pain (pain score 7-10).    Take 1 tablet by mouth every 6 (six) hours as needed for moderate pain (pain score 4-6) or severe pain (pain score 7-10).  Discontinued Medications   No medications on file   ----------------------------------------------------------------------------------------------------------------------  Follow-up: Return in about 2 months (around 04/13/2024) for evaluation, procedure.    Zula Hitch,  MD

## 2024-02-12 NOTE — Patient Instructions (Signed)

## 2024-02-12 NOTE — Progress Notes (Signed)
Nursing Pain Medication Assessment:  Safety precautions to be maintained throughout the outpatient stay will include: orient to surroundings, keep bed in low position, maintain call bell within reach at all times, provide assistance with transfer out of bed and ambulation.  Medication Inspection Compliance: Mr. Osmundson did not comply with our request to bring his pills to be counted. He was reminded that bringing the medication bottles, even when empty, is a requirement.  Medication: None brought in. Pill/Patch Count: None available to be counted. Bottle Appearance: No container available. Did not bring bottle(s) to appointment. Filled Date: N/A Last Medication intake:  Today

## 2024-02-16 LAB — TOXASSURE SELECT 13 (MW), URINE

## 2024-04-09 ENCOUNTER — Ambulatory Visit: Payer: Self-pay | Attending: Anesthesiology | Admitting: Anesthesiology

## 2024-04-09 ENCOUNTER — Encounter: Payer: Self-pay | Admitting: Anesthesiology

## 2024-04-09 DIAGNOSIS — M5431 Sciatica, right side: Secondary | ICD-10-CM

## 2024-04-09 DIAGNOSIS — M542 Cervicalgia: Secondary | ICD-10-CM

## 2024-04-09 DIAGNOSIS — M5432 Sciatica, left side: Secondary | ICD-10-CM

## 2024-04-09 DIAGNOSIS — Z79891 Long term (current) use of opiate analgesic: Secondary | ICD-10-CM

## 2024-04-09 DIAGNOSIS — M47817 Spondylosis without myelopathy or radiculopathy, lumbosacral region: Secondary | ICD-10-CM

## 2024-04-09 DIAGNOSIS — G894 Chronic pain syndrome: Secondary | ICD-10-CM

## 2024-04-09 DIAGNOSIS — M4316 Spondylolisthesis, lumbar region: Secondary | ICD-10-CM

## 2024-04-09 DIAGNOSIS — M47816 Spondylosis without myelopathy or radiculopathy, lumbar region: Secondary | ICD-10-CM

## 2024-04-09 DIAGNOSIS — G43709 Chronic migraine without aura, not intractable, without status migrainosus: Secondary | ICD-10-CM

## 2024-04-09 MED ORDER — BUTALBITAL-APAP-CAFFEINE 50-325-40 MG PO TABS: 1.0000 | ORAL_TABLET | Freq: Four times a day (QID) | ORAL | 0 refills | Status: DC | PRN

## 2024-04-09 MED ORDER — HYDROCODONE-ACETAMINOPHEN 5-325 MG PO TABS: 1.0000 | ORAL_TABLET | Freq: Four times a day (QID) | ORAL | 0 refills | Status: AC | PRN

## 2024-04-09 NOTE — Progress Notes (Unsigned)
 Virtual Visit via Telephone Note  I connected with Stephen Wise on 04/09/24 at 10:00 AM EDT by telephone and verified that I am speaking with the correct person using two identifiers.  Location: Patient: Home Provider: Pain control center   I discussed the limitations, risks, security and privacy concerns of performing an evaluation and management service by telephone and the availability of in person appointments. I also discussed with the patient that there may be a patient responsible charge related to this service. The patient expressed understanding and agreed to proceed.   History of Present Illness: As able to reach out to [employer] via telephone.  We are unable to do the video portion of the conference but he reports that he is doing well.  He is continuing to do his physical therapy stretching strengthening exercises as tolerated.  He is doing a fair amount of walking and even throwing and a little bit of running.  He still having low back pain comparable to what he has experienced before but he is adding in some new psoas stretches which seem to help.  He uses his Vicodin 5 mg tablets to help with his low back pain that still present throughout the day but giving him about 75% relief when he takes them.  This relief from the Vicodin last about 4 to 6 hours.  With the medications he is able to stay active and do his daily routine and maintain a productive lifestyle.  Without the medications he has more recumbent and becomes dysfunctional with more spasming.  No change in the quality characteristic or distribution of the pain is noted.  Otherwise he is doing well with no change in lower EXTR strength function bowel or bladder function.  Review of systems: General: No fevers or chills Pulmonary: No shortness of breath or dyspnea Cardiac: No angina or palpitations or lightheadedness GI: No abdominal pain or constipation Psych: No depression    Observations/Objective:  Current Outpatient  Medications:    butalbital -acetaminophen -caffeine  (FIORICET) 50-325-40 MG tablet, Take 1 tablet by mouth every 6 (six) hours as needed for headache., Disp: 30 tablet, Rfl: 0   [START ON 05/12/2024] HYDROcodone -acetaminophen  (NORCO/VICODIN) 5-325 MG tablet, Take 1 tablet by mouth every 6 (six) hours as needed for moderate pain (pain score 4-6) or severe pain (pain score 7-10)., Disp: 90 tablet, Rfl: 0   amphetamine -dextroamphetamine  (ADDERALL XR) 20 MG 24 hr capsule, Take 1 capsule (20 mg total) by mouth every morning., Disp: 30 capsule, Rfl: 0   ferrous sulfate 325 (65 FE) MG tablet, Take 325 mg by mouth daily with breakfast., Disp: , Rfl:    [START ON 04/12/2024] HYDROcodone -acetaminophen  (NORCO/VICODIN) 5-325 MG tablet, Take 1 tablet by mouth every 6 (six) hours as needed for moderate pain (pain score 4-6) or severe pain (pain score 7-10)., Disp: 90 tablet, Rfl: 0   mesalamine (PENTASA) 500 MG CR capsule, Take 1,000 mg by mouth 3 (three) times daily as needed. , Disp: , Rfl:    pantoprazole  (PROTONIX ) 40 MG tablet, Take 40 mg by mouth daily., Disp: , Rfl:    SUMAtriptan  (IMITREX ) 100 MG tablet, May repeat in 2 hours if headache persists or recurs., Disp: 9 tablet, Rfl: 4   Past Medical History:  Diagnosis Date   Adult ADHD    Chronic headaches    Chronic pain    Chronic prostatitis    Condyloma acuminatum of penis    Crohn disease (HCC)    with terminal ileitis   ED (erectile dysfunction)  Gastric ulcer    Inflammatory bowel disease    Microcytic anemia    Migraines 10/01/2019   Peyronie's disease    Assessment and Plan:  1. Sciatica of left side   2. Lumbosacral spondylosis without myelopathy   3. Chronic pain syndrome   4. Long term current use of opiate analgesic   5. Facet arthritis of lumbar region   6. Spondylolisthesis of lumbar region   7. Chronic migraine without aura without status migrainosus, not intractable   8. Neck pain   9. Sciatica of right side    Based on  our assessment today and after review of the   practitioner database information it is appropriate to schedule him for refills.  This will be for Vicodin to be taken as scheduled for July 11 and August 10.  No other changes are initiated today.  I will also refill his butalbital  to help with the migraine and tension headaches he periodically gets.  He averages about 30 tablets on the 54-month basis.  This does help as a rescue dose in addition to his Imitrex .  He is to continue follow-up with his family practice doctors for any recurrence of the headaches or change in the quality characteristic or distribution of the headaches.  As he notes, we do not treat migraine headaches but can assist with the tension headaches as long as they are stable in nature without change in character.  Additionally, continue exercise and physical therapy as tolerated with continue follow-up with his primary care physician for baseline medical care.  Will schedule him for 28-month return to clinic. Follow Up Instructions:    I discussed the assessment and treatment plan with the patient. The patient was provided an opportunity to ask questions and all were answered. The patient agreed with the plan and demonstrated an understanding of the instructions.   The patient was advised to call back or seek an in-person evaluation if the symptoms worsen or if the condition fails to improve as anticipated.  I provided 30 minutes of non-face-to-face time during this encounter.   Lynwood KANDICE Clause, MD

## 2024-06-10 ENCOUNTER — Telehealth: Payer: Self-pay | Admitting: Physician Assistant

## 2024-06-10 DIAGNOSIS — R21 Rash and other nonspecific skin eruption: Secondary | ICD-10-CM

## 2024-06-10 NOTE — Progress Notes (Signed)
  Because of the complaint and symptoms being present for a while with no known exposure, I feel your condition warrants further evaluation and I recommend that you be seen in a face-to-face visit for a thorough evaluation.   NOTE: There will be NO CHARGE for this E-Visit   If you are having a true medical emergency, please call 911.     For an urgent face to face visit, [facility] has multiple urgent care centers for your convenience.  Click the link below for the full list of locations and hours, walk-in wait times, appointment scheduling options and driving directions:  Urgent Care - [place], [place], [place], [place], [place], [place]  [place]     Your MyChart E-visit questionnaire answers were reviewed by a board certified advanced clinical practitioner to complete your personal care plan based on your specific symptoms.    Thank you for using e-Visits.       I have spent 5 minutes in review of e-visit questionnaire, review and updating patient chart, medical decision making and response to patient.   Delon CHRISTELLA Dickinson, PA-C

## 2024-06-11 ENCOUNTER — Telehealth: Payer: Self-pay | Admitting: Family Medicine

## 2024-06-11 DIAGNOSIS — R21 Rash and other nonspecific skin eruption: Secondary | ICD-10-CM

## 2024-06-11 NOTE — Progress Notes (Signed)
 [place]   Needs in person assessment, given the symptoms he is having about a skin rash. Also did EV yesterday- same assessment provided.   Patient acknowledged agreement and understanding of the plan.

## 2024-06-13 ENCOUNTER — Ambulatory Visit: Payer: Self-pay | Attending: Anesthesiology | Admitting: Anesthesiology

## 2024-06-13 ENCOUNTER — Encounter: Payer: Self-pay | Admitting: Anesthesiology

## 2024-06-13 DIAGNOSIS — M5432 Sciatica, left side: Secondary | ICD-10-CM

## 2024-06-13 DIAGNOSIS — M4316 Spondylolisthesis, lumbar region: Secondary | ICD-10-CM | POA: Diagnosis not present

## 2024-06-13 DIAGNOSIS — G894 Chronic pain syndrome: Secondary | ICD-10-CM | POA: Diagnosis not present

## 2024-06-13 DIAGNOSIS — M542 Cervicalgia: Secondary | ICD-10-CM

## 2024-06-13 DIAGNOSIS — M47816 Spondylosis without myelopathy or radiculopathy, lumbar region: Secondary | ICD-10-CM

## 2024-06-13 DIAGNOSIS — Z79891 Long term (current) use of opiate analgesic: Secondary | ICD-10-CM

## 2024-06-13 DIAGNOSIS — G43709 Chronic migraine without aura, not intractable, without status migrainosus: Secondary | ICD-10-CM

## 2024-06-13 DIAGNOSIS — M47817 Spondylosis without myelopathy or radiculopathy, lumbosacral region: Secondary | ICD-10-CM | POA: Diagnosis not present

## 2024-06-13 MED ORDER — HYDROCODONE-ACETAMINOPHEN 5-325 MG PO TABS: 1.0000 | ORAL_TABLET | Freq: Four times a day (QID) | ORAL | 0 refills | Status: AC | PRN

## 2024-06-13 MED ORDER — BUTALBITAL-APAP-CAFFEINE 50-325-40 MG PO TABS: 1.0000 | ORAL_TABLET | Freq: Four times a day (QID) | ORAL | 0 refills | Status: AC | PRN

## 2024-06-13 NOTE — Progress Notes (Signed)
 Virtual Visit via Telephone Note  I connected with Stephen Wise on 06/13/24 at  9:45 AM EDT by telephone and verified that I am speaking with the correct person using two identifiers.  Location: Patient: Home Provider: [facility] control center   I discussed the limitations, risks, security and privacy concerns of performing an evaluation and management service by telephone and the availability of in person appointments. I also discussed with the patient that there may be a patient responsible charge related to this service. The patient expressed understanding and agreed to proceed.   History of Present Illness: I spoke with Stephen Wise via telephone as we are unable to link for the video portion of the conference.  He reports that his low back pain and lower leg pain has been stable with no recent changes or problems.  He takes hydrocodone  as scheduled and this continues to work well for him.  He also uses butalbital  for periodic headaches but averages 1 prescription of 30 tablets about every 3 to 4 months.  Quality characteristic and distribution of the migraines has been stable and is followed by his primary care neurology team.  Additionally, the lower extremity pain and lower back pain has been stable with no changes noted.  No changes in lower extremity strength function bowel or bladder function.  He continues to do his physical therapy and core strengthening daily.  Review of systems: General: No fevers or chills Pulmonary: No shortness of breath or dyspnea Cardiac: No angina or palpitations or lightheadedness GI: No abdominal pain or constipation Psych: No depression    Observations/Objective:  Current Outpatient Medications:    HYDROcodone -acetaminophen  (NORCO/VICODIN) 5-325 MG tablet, Take 1 tablet by mouth every 6 (six) hours as needed for moderate pain (pain score 4-6) or severe pain (pain score 7-10)., Disp: 90 tablet, Rfl: 0   [START ON 07/13/2024] HYDROcodone -acetaminophen   (NORCO/VICODIN) 5-325 MG tablet, Take 1 tablet by mouth every 6 (six) hours as needed for moderate pain (pain score 4-6) or severe pain (pain score 7-10)., Disp: 90 tablet, Rfl: 0   amphetamine -dextroamphetamine  (ADDERALL XR) 20 MG 24 hr capsule, Take 1 capsule (20 mg total) by mouth every morning., Disp: 30 capsule, Rfl: 0   [START ON 07/13/2024] butalbital -acetaminophen -caffeine  (FIORICET) 50-325-40 MG tablet, Take 1 tablet by mouth every 6 (six) hours as needed for headache., Disp: 30 tablet, Rfl: 0   mesalamine (PENTASA) 500 MG CR capsule, Take 1,000 mg by mouth 3 (three) times daily as needed. , Disp: , Rfl:    SUMAtriptan  (IMITREX ) 100 MG tablet, May repeat in 2 hours if headache persists or recurs., Disp: 9 tablet, Rfl: 4   Past Medical History:  Diagnosis Date   Adult ADHD    Chronic headaches    Chronic pain    Chronic prostatitis    Condyloma acuminatum of penis    Crohn disease (HCC)    with terminal ileitis   ED (erectile dysfunction)    Gastric ulcer    Inflammatory bowel disease    Microcytic anemia    Migraines 10/01/2019   Peyronie's disease    Assessment and Plan:  1. Sciatica of left side   2. Lumbosacral spondylosis without myelopathy   3. Chronic pain syndrome   4. Spondylolisthesis of lumbar region   5. Facet arthritis of lumbar region   6. Long term current use of opiate analgesic   7. Chronic migraine without aura without status migrainosus, not intractable   8. Neck pain    Based on conversation  after review of the [place]  practitioner database is appropriate to refill his medicines for the next 2 months dated for September 11 and October 11 with a refill of his butalbital  for October 11.  I have encouraged him to continue follow-up with his neurologist and primary care physicians for baseline medical care and management of his headaches.  Additionally, continue core stretching strengthening exercises as he is doing with return to clinic scheduled in 2  months. Follow Up Instructions:    I discussed the assessment and treatment plan with the patient. The patient was provided an opportunity to ask questions and all were answered. The patient agreed with the plan and demonstrated an understanding of the instructions.   The patient was advised to call back or seek an in-person evaluation if the symptoms worsen or if the condition fails to improve as anticipated.  I provided 30 minutes of non-face-to-face time during this encounter.   Lynwood KANDICE Clause, MD

## 2024-06-20 ENCOUNTER — Ambulatory Visit
Admission: EM | Admit: 2024-06-20 | Discharge: 2024-06-20 | Disposition: A | Discharge status: Home / Self Care | Attending: Emergency Medicine | Admitting: Emergency Medicine

## 2024-06-20 ENCOUNTER — Encounter: Payer: Self-pay | Admitting: *Deleted

## 2024-06-20 DIAGNOSIS — L299 Pruritus, unspecified: Secondary | ICD-10-CM

## 2024-06-20 DIAGNOSIS — Z758 Other problems related to medical facilities and other health care: Secondary | ICD-10-CM

## 2024-06-20 MED ORDER — HYDROXYZINE HCL 25 MG PO TABS: 25.0000 mg | ORAL_TABLET | Freq: Four times a day (QID) | ORAL | 0 refills | Status: AC

## 2024-06-20 NOTE — ED Triage Notes (Signed)
 Patient states a few weeks of rash to arms, neck, legs and scalp, very itchy, has tried OTC oils and gels, lice shampoo with no relief

## 2024-06-20 NOTE — ED Provider Notes (Signed)
 [facility]    CSN: [id number] Arrival date & time: 06/20/24  1734      History   Chief Complaint Chief Complaint  Patient presents with   body rash    HPI Stephen Wise is a 53 y.o. male.   53 year old male pt, Stephen Wise, presents to urgent care for evaluation of rash to body and scalp, patient states he has tried over-the-counter tea tree oil, gels, lice shampoo without relief.   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  Patient denies smoking,drinking, or drug use  Ddx: ADHD, Crohn's disease, migraines  The history is provided by the patient. No language interpreter was used.    Past Medical History:  Diagnosis Date   Adult ADHD    Chronic headaches    Chronic pain    Chronic prostatitis    Condyloma acuminatum of penis    Crohn disease (HCC)    with terminal ileitis   ED (erectile dysfunction)    Gastric ulcer    Inflammatory bowel disease    Microcytic anemia    Migraines 10/01/2019   Peyronie's disease     Patient Active Problem List   Diagnosis Date Noted   Itchy skin 06/20/2024   Does not have primary care provider 06/20/2024   Sciatica of right side 02/12/2024   Migraines 10/01/2019   GERD (gastroesophageal reflux disease) 09/05/2019   ED (erectile dysfunction) 09/05/2019   IBS (irritable bowel syndrome) 09/05/2019   Chronic pain    Peyronie's disease 11/26/2015   Adult ADHD 11/12/2014   Chronic headaches 03/13/2014   Crohn's disease (HCC) 03/13/2014    Past Surgical History:  Procedure Laterality Date   COLON SURGERY  2000   removal of colon and small intestine due to Crohn's   COLONOSCOPY  07/21/2015   also done in 10/10, 6/00   ESOPHAGOGASTRODUODENOSCOPY  03/08/1999   WISDOM TOOTH EXTRACTION         Home Medications    Prior to Admission medications   Medication Sig Start Date End Date Taking?  Authorizing Provider  hydrOXYzine  (ATARAX ) 25 MG tablet Take 1 tablet (25 mg total) by mouth every 6 (six) hours. 06/20/24  Yes Demoni Parmar, NP  amphetamine -dextroamphetamine  (ADDERALL XR) 20 MG 24 hr capsule Take 1 capsule (20 mg total) by mouth every morning. 12/13/23   Myra Lynwood MATSU, MD  butalbital -acetaminophen -caffeine  (FIORICET) 50-325-40 MG tablet Take 1 tablet by mouth every 6 (six) hours as needed for headache. 07/13/24 10/11/24  Myra Lynwood MATSU, MD  HYDROcodone -acetaminophen  (NORCO/VICODIN) 5-325 MG tablet Take 1 tablet by mouth every 6 (six) hours as needed for moderate pain (pain score 4-6) or severe pain (pain score 7-10). 06/13/24 07/13/24  Myra Lynwood MATSU, MD  HYDROcodone -acetaminophen  (NORCO/VICODIN) 5-325 MG tablet Take 1 tablet by mouth every 6 (six) hours as needed for moderate pain (pain score 4-6) or severe pain (pain score 7-10). 07/13/24 08/12/24  Myra Lynwood MATSU, MD  mesalamine (PENTASA) 500 MG CR capsule Take 1,000 mg by mouth 3 (three) times daily as needed.  05/18/15   [provider]  SUMAtriptan  (IMITREX ) 100 MG tablet May repeat in 2 hours if headache persists or recurs. 11/08/19   Skeet Juliene SAUNDERS, DO    Family History Family History  Problem Relation Age of Onset   Irritable bowel syndrome Mother    Cirrhosis Father  Alzheimer's disease Maternal Grandmother     Social History Social History   Tobacco Use   Smoking status: Never   Smokeless tobacco: Never  Vaping Use   Vaping status: Never Used  Substance Use Topics   Alcohol use: Yes    Comment: social   Drug use: No     Allergies   Patient has no known allergies.   Review of Systems Review of Systems  Skin:  Positive for rash.  Psychiatric/Behavioral:  The patient is nervous/anxious.   All other systems reviewed and are negative.    Physical Exam Triage Vital Signs ED Triage Vitals  Encounter Vitals Group     BP 06/20/24 1759 135/86     Girls Systolic BP Percentile --      Girls  Diastolic BP Percentile --      Boys Systolic BP Percentile --      Boys Diastolic BP Percentile --      Pulse Rate 06/20/24 1759 65     Resp 06/20/24 1759 20     Temp 06/20/24 1759 99 F (37.2 C)     Temp Source 06/20/24 1759 Oral     SpO2 06/20/24 1759 96 %     Weight 06/20/24 1757 213 lb 9.6 oz (96.9 kg)     Height 06/20/24 1757 5' 10.5 (1.791 m)     Head Circumference --      Peak Flow --      Pain Score 06/20/24 1757 0     Pain Loc --      Pain Education --      Exclude from Growth Chart --    No data found.  Updated Vital Signs BP 135/86 (BP Location: Right Arm)   Pulse 65   Temp 99 F (37.2 C) (Oral)   Resp 20   Ht 5' 10.5 (1.791 m)   Wt 213 lb 9.6 oz (96.9 kg)   SpO2 96%   BMI 30.22 kg/m   Visual Acuity Right Eye Distance:   Left Eye Distance:   Bilateral Distance:    Right Eye Near:   Left Eye Near:    Bilateral Near:     Physical Exam Vitals and nursing note reviewed.  Constitutional:      General: He is not in acute distress.    Appearance: He is well-developed.  HENT:     Head: Normocephalic and atraumatic.  Eyes:     Conjunctiva/sclera: Conjunctivae normal.  Cardiovascular:     Rate and Rhythm: Normal rate and regular rhythm.     Heart sounds: No murmur heard. Pulmonary:     Effort: Pulmonary effort is normal. No respiratory distress.     Breath sounds: Normal breath sounds.  Abdominal:     Palpations: Abdomen is soft.     Tenderness: There is no abdominal tenderness.  Musculoskeletal:        General: No swelling.     Cervical back: Neck supple.  Skin:    General: Skin is warm and dry.     Capillary Refill: Capillary refill takes less than 2 seconds.     Comments: No evidence of infestation or rash noted by this provider  Neurological:     General: No focal deficit present.     Mental Status: He is alert and oriented to person, place, and time.     GCS: GCS eye subscore is 4. GCS verbal subscore is 5. GCS motor subscore is 6.   Psychiatric:  Attention and Perception: Attention normal.        Mood and Affect: Mood is anxious.        Speech: Speech normal.        Behavior: Behavior is agitated.      UC Treatments / Results  Labs (all labs ordered are listed, but only abnormal results are displayed) Labs Reviewed - No data to display  EKG   Radiology No results found.  Procedures Procedures (including critical care time)  Medications Ordered in UC Medications - No data to display  Initial Impression / Assessment and Plan / UC Course  I have reviewed the triage vital signs and the nursing notes.  Pertinent labs & imaging results that were available during my care of the patient were reviewed by me and considered in my medical decision making (see chart for details).  Clinical Course as of 06/20/24 2059  Thu Jun 20, 2024  [id number] Discussed case with colleague and had her evaluate patient for second opinion, no evidence of rash or infestation by calling her myself.  Patient reassured, will refer to PCP and dermatology for follow-up, patient verbalized understanding to this provider [JD]    Clinical Course User Index [JD] Adyn Serna, Rilla, NP   Discussed exam findings and plan of care with patient, will prescription Atarax  for itching, for patient to PCP and dermatology for follow-up, patient verbalized understanding to this provider.  Ddx: Pruritus , dermatitis, allergies , infestation(scabies, lice), stress, psychiatric disorder Final Clinical Impressions(s) / UC Diagnoses   Final diagnoses:  Itchy skin  Does not have primary care provider     Discharge Instructions      Please get established with PCP for general medical issues, take atarax  as prescribed, heat and hot water will make your rashes/itching worse Please follow up with dermatology-call for appt Avoid harsh lotions, shampoos,detergents etc No evidence of scabies or lice at today's visit If you have new or worsening  issues go to the Er for further evaluation    ED Prescriptions     Medication Sig Dispense Auth. Provider   hydrOXYzine  (ATARAX ) 25 MG tablet Take 1 tablet (25 mg total) by mouth every 6 (six) hours. 12 tablet Oley Lahaie, Rilla, NP      PDMP not reviewed this encounter.   Aminta Rilla, NP 06/20/24 2059

## 2024-06-20 NOTE — Discharge Instructions (Addendum)
 Please get established with PCP for general medical issues, take atarax  as prescribed, heat and hot water will make your rashes/itching worse Please follow up with dermatology-call for appt Avoid harsh lotions, shampoos,detergents etc No evidence of scabies or lice at today's visit If you have new or worsening issues go to the Er for further evaluation

## 2024-08-13 ENCOUNTER — Encounter: Payer: Self-pay | Admitting: Anesthesiology

## 2024-08-13 ENCOUNTER — Ambulatory Visit: Attending: Anesthesiology | Admitting: Anesthesiology

## 2024-08-13 DIAGNOSIS — G894 Chronic pain syndrome: Secondary | ICD-10-CM

## 2024-08-13 DIAGNOSIS — M47817 Spondylosis without myelopathy or radiculopathy, lumbosacral region: Secondary | ICD-10-CM

## 2024-08-13 DIAGNOSIS — G43709 Chronic migraine without aura, not intractable, without status migrainosus: Secondary | ICD-10-CM

## 2024-08-13 DIAGNOSIS — M4316 Spondylolisthesis, lumbar region: Secondary | ICD-10-CM

## 2024-08-13 DIAGNOSIS — Z79891 Long term (current) use of opiate analgesic: Secondary | ICD-10-CM

## 2024-08-13 DIAGNOSIS — M5432 Sciatica, left side: Secondary | ICD-10-CM | POA: Diagnosis not present

## 2024-08-13 DIAGNOSIS — M542 Cervicalgia: Secondary | ICD-10-CM

## 2024-08-13 DIAGNOSIS — M47816 Spondylosis without myelopathy or radiculopathy, lumbar region: Secondary | ICD-10-CM

## 2024-08-13 MED ORDER — HYDROCODONE-ACETAMINOPHEN 5-325 MG PO TABS: 1.0000 | ORAL_TABLET | Freq: Four times a day (QID) | ORAL | 0 refills | Status: AC | PRN

## 2024-08-13 NOTE — Progress Notes (Signed)
 Virtual Visit via Telephone Note  I connected with Stephen Wise on 08/13/24 at  9:00 AM EST by telephone and verified that I am speaking with the correct person using two identifiers.  Location: Patient: Home Provider: Pain control center   I discussed the limitations, risks, security and privacy concerns of performing an evaluation and management service by telephone and the availability of in person appointments. I also discussed with the patient that there may be a patient responsible charge related to this service. The patient expressed understanding and agreed to proceed.   History of Present Illness: I spoke with Stephen Wise via telephone as we could not like for the video portion of hours.  He reports that he has been doing well.  He is taking his medication as prescribed using his hydrocodone  3 times a day.  This regimen continues to keep his back and neck pain under control.  He has been on chronic opioid therapy for maintenance of these issues and continues to do well with improved overall lifestyle and function.  He continues to work full-time.  He maintains that without the medications and on more conservative therapy he is dysfunctional and cannot work.  He denies any sedation or side effects with the medicines.  He occasionally has some mild constipation but this is generally manageable with over-the-counter medications.  His headaches also no change in the quality characteristic or distribution of these migraines has been noted.  He takes butalbital  occasionally to help with the intractable headaches when he has these.  He continues to follow along with his primary care physicians in regards to this.  Otherwise he is in his usual state of health with no new changes in upper or lower extremity strength function bowel or bladder function.  He denies side effects with his medications.  Review of systems: General: No fevers or chills Pulmonary: No shortness of breath or dyspnea Cardiac: No  angina or palpitations or lightheadedness GI: No abdominal pain or constipation Psych: No depression    Observations/Objective:  Current Outpatient Medications:    HYDROcodone -acetaminophen  (NORCO/VICODIN) 5-325 MG tablet, Take 1 tablet by mouth every 6 (six) hours as needed for moderate pain (pain score 4-6) or severe pain (pain score 7-10)., Disp: 90 tablet, Rfl: 0   [START ON 09/11/2024] HYDROcodone -acetaminophen  (NORCO/VICODIN) 5-325 MG tablet, Take 1 tablet by mouth every 6 (six) hours as needed for moderate pain (pain score 4-6) or severe pain (pain score 7-10)., Disp: 90 tablet, Rfl: 0   amphetamine -dextroamphetamine  (ADDERALL XR) 20 MG 24 hr capsule, Take 1 capsule (20 mg total) by mouth every morning., Disp: 30 capsule, Rfl: 0   butalbital -acetaminophen -caffeine  (FIORICET) 50-325-40 MG tablet, Take 1 tablet by mouth every 6 (six) hours as needed for headache., Disp: 30 tablet, Rfl: 0   hydrOXYzine  (ATARAX ) 25 MG tablet, Take 1 tablet (25 mg total) by mouth every 6 (six) hours., Disp: 12 tablet, Rfl: 0   mesalamine (PENTASA) 500 MG CR capsule, Take 1,000 mg by mouth 3 (three) times daily as needed. , Disp: , Rfl:    SUMAtriptan  (IMITREX ) 100 MG tablet, May repeat in 2 hours if headache persists or recurs., Disp: 9 tablet, Rfl: 4   Past Medical History:  Diagnosis Date   Adult ADHD    Chronic headaches    Chronic pain    Chronic prostatitis    Condyloma acuminatum of penis    Crohn disease (HCC)    with terminal ileitis   ED (erectile dysfunction)    Gastric  ulcer    Inflammatory bowel disease    Microcytic anemia    Migraines 10/01/2019   Peyronie's disease    Assessment and Plan: 1. Sciatica of left side   2. Lumbosacral spondylosis without myelopathy   3. Chronic pain syndrome   4. Spondylolisthesis of lumbar region   5. Facet arthritis of lumbar region   6. Long term current use of opiate analgesic   7. Chronic migraine without aura without status migrainosus, not  intractable   8. Neck pain    Based on discussion today I think it is appropriate to refill his medicines for the next 2 months dated on November 11 and December 10.  I have reviewed the [place]  practitioner database information is appropriate for refill.  I encouraged him to continue with stretching strengthening exercises as reviewed.  No other changes are initiated.  I have encouraged him to continue follow-up with his primary care physician for baseline medical care.  He is scheduled for 10-month return  Follow Up Instructions:    I discussed the assessment and treatment plan with the patient. The patient was provided an opportunity to ask questions and all were answered. The patient agreed with the plan and demonstrated an understanding of the instructions.   The patient was advised to call back or seek an in-person evaluation if the symptoms worsen or if the condition fails to improve as anticipated.  I provided 30 minutes of non-face-to-face time during this encounter.   Lynwood KANDICE Clause, MD

## 2024-10-14 ENCOUNTER — Ambulatory Visit: Attending: Anesthesiology | Admitting: Anesthesiology

## 2024-10-14 ENCOUNTER — Encounter: Payer: Self-pay | Admitting: Anesthesiology

## 2024-10-14 DIAGNOSIS — Z79891 Long term (current) use of opiate analgesic: Secondary | ICD-10-CM

## 2024-10-14 DIAGNOSIS — M47817 Spondylosis without myelopathy or radiculopathy, lumbosacral region: Secondary | ICD-10-CM | POA: Diagnosis not present

## 2024-10-14 DIAGNOSIS — M5432 Sciatica, left side: Secondary | ICD-10-CM

## 2024-10-14 DIAGNOSIS — G43709 Chronic migraine without aura, not intractable, without status migrainosus: Secondary | ICD-10-CM

## 2024-10-14 DIAGNOSIS — M542 Cervicalgia: Secondary | ICD-10-CM | POA: Diagnosis not present

## 2024-10-14 DIAGNOSIS — G894 Chronic pain syndrome: Secondary | ICD-10-CM | POA: Diagnosis not present

## 2024-10-14 DIAGNOSIS — M4316 Spondylolisthesis, lumbar region: Secondary | ICD-10-CM

## 2024-10-14 DIAGNOSIS — M47816 Spondylosis without myelopathy or radiculopathy, lumbar region: Secondary | ICD-10-CM

## 2024-10-14 MED ORDER — BUTALBITAL-ASPIRIN-CAFFEINE 50-325-40 MG PO CAPS: 1.0000 | ORAL_CAPSULE | Freq: Two times a day (BID) | ORAL | 0 refills | Status: AC | PRN

## 2024-10-14 MED ORDER — HYDROCODONE-ACETAMINOPHEN 5-325 MG PO TABS: 1.0000 | ORAL_TABLET | Freq: Four times a day (QID) | ORAL | 0 refills | Status: AC | PRN

## 2024-10-14 NOTE — Progress Notes (Signed)
 Virtual Visit via Telephone Note  I connected with Stephen Wise on 10/14/2024 at  1:45 PM EST by telephone and verified that I am speaking with the correct person using two identifiers.  Location: Patient: Home Provider: Pain control center   I discussed the limitations, risks, security and privacy concerns of performing an evaluation and management service by telephone and the availability of in person appointments. I also discussed with the patient that there may be a patient responsible charge related to this service. The patient expressed understanding and agreed to proceed.   History of Present Illness:  Stephen Wise presents for reevaluation.  He was last seen 2 months ago and is doing reasonably well.  His low back pain is staying stable in nature with no recent changes.  He takes his 5 mg hydrocodone  every 6-8 hours for 3 times a day on average.  The quality characteristic and distribution of the pain has been slightly worse as he had a busy end of the year.  He is also been very busy at work with the start of the year.  He generally gets about 75 to 80% relief with the medications lasting for this period of time no side effects reported.  Change in upper or lower body strength function bowel or bladder function noted.  His migraine headaches are still paroxysmal but of the same quality.  He takes butalbital  on a sparing basis and uses about 30 tablets over the course of 90 days.  He sees his primary care physicians for migraine maintenance but we have been assisting with the administration butalbital  for breakthrough headaches and this medication has been well-tolerated and effective for him.  In his usual state of health.   Observations/Objective: Current Medications[1]   Past Medical History:  Diagnosis Date   Adult ADHD    Chronic headaches    Chronic pain    Chronic prostatitis    Condyloma acuminatum of penis    Crohn disease (HCC)    with terminal ileitis   ED (erectile  dysfunction)    Gastric ulcer    Inflammatory bowel disease    Microcytic anemia    Migraines 10/01/2019   Peyronie's disease    Assessment and Plan:  1. Sciatica of left side   2. Lumbosacral spondylosis without myelopathy   3. Chronic pain syndrome   4. Spondylolisthesis of lumbar region   5. Facet arthritis of lumbar region   6. Long term current use of opiate analgesic   7. Chronic migraine without aura without status migrainosus, not intractable   8. Neck pain    After discussion today I think is appropriate refills medicines for the next 2 months dated for January 12 and February 11.  No other changes in his regimen are initiated.  Have encouraged him to continue with stretching strengthening exercises as he is now doing.  He seems to be doing quite well with his current regimen.  I will also refill his butalbital .  I have reviewed the [place]  practitioner database information and is appropriate for refill.  He is using these on average 30 tablets over 90 days.  The quality and character of the headaches remain stable and paroxysmal in nature.  I have encouraged him to continue follow-up with his primary care physician and neurologist for ongoing maintenance of the migraines as needed.  Will schedule him for a 42-month return to clinic.   I discussed the assessment and treatment plan with the patient. The patient was provided an  opportunity to ask questions and all were answered. The patient agreed with the plan and demonstrated an understanding of the instructions.   The patient was advised to call back or seek an in-person evaluation if the symptoms worsen or if the condition fails to improve as anticipated.  I provided 30 minutes of non-face-to-face time during this encounter.   Lynwood KANDICE Clause, MD      [1]  Current Outpatient Medications:    butalbital -aspirin -caffeine  (FIORINAL ) 50-325-40 MG capsule, Take 1 capsule by mouth 2 (two) times daily as needed for headache.,  Disp: 30 capsule, Rfl: 0   HYDROcodone -acetaminophen  (NORCO/VICODIN) 5-325 MG tablet, Take 1 tablet by mouth every 6 (six) hours as needed for moderate pain (pain score 4-6) or severe pain (pain score 7-10)., Disp: 90 tablet, Rfl: 0   [START ON 11/13/2024] HYDROcodone -acetaminophen  (NORCO/VICODIN) 5-325 MG tablet, Take 1 tablet by mouth every 6 (six) hours as needed for moderate pain (pain score 4-6) or severe pain (pain score 7-10)., Disp: 90 tablet, Rfl: 0   amphetamine -dextroamphetamine  (ADDERALL XR) 20 MG 24 hr capsule, Take 1 capsule (20 mg total) by mouth every morning., Disp: 30 capsule, Rfl: 0   hydrOXYzine  (ATARAX ) 25 MG tablet, Take 1 tablet (25 mg total) by mouth every 6 (six) hours., Disp: 12 tablet, Rfl: 0   mesalamine (PENTASA) 500 MG CR capsule, Take 1,000 mg by mouth 3 (three) times daily as needed. , Disp: , Rfl:    SUMAtriptan  (IMITREX ) 100 MG tablet, May repeat in 2 hours if headache persists or recurs., Disp: 9 tablet, Rfl: 4
